# Patient Record
Sex: Female | Born: 1955 | Race: Black or African American | Hispanic: No | Marital: Single | Smoking: Light tobacco smoker
Health system: Southern US, Community
[De-identification: ages and names within clinical notes are randomized; demographics above are authoritative.]

## PROBLEM LIST (undated history)

## (undated) DIAGNOSIS — E119 Type 2 diabetes mellitus without complications: Secondary | ICD-10-CM

## (undated) DIAGNOSIS — Z923 Personal history of irradiation: Secondary | ICD-10-CM

## (undated) DIAGNOSIS — I10 Essential (primary) hypertension: Secondary | ICD-10-CM

## (undated) DIAGNOSIS — F32A Depression, unspecified: Secondary | ICD-10-CM

## (undated) DIAGNOSIS — H53149 Visual discomfort, unspecified: Secondary | ICD-10-CM

## (undated) DIAGNOSIS — E785 Hyperlipidemia, unspecified: Secondary | ICD-10-CM

## (undated) DIAGNOSIS — D332 Benign neoplasm of brain, unspecified: Secondary | ICD-10-CM

## (undated) DIAGNOSIS — I2510 Atherosclerotic heart disease of native coronary artery without angina pectoris: Secondary | ICD-10-CM

## (undated) DIAGNOSIS — M1990 Unspecified osteoarthritis, unspecified site: Secondary | ICD-10-CM

## (undated) DIAGNOSIS — I2119 ST elevation (STEMI) myocardial infarction involving other coronary artery of inferior wall: Secondary | ICD-10-CM

## (undated) DIAGNOSIS — R519 Headache, unspecified: Secondary | ICD-10-CM

## (undated) DIAGNOSIS — R51 Headache: Secondary | ICD-10-CM

## (undated) DIAGNOSIS — F329 Major depressive disorder, single episode, unspecified: Secondary | ICD-10-CM

## (undated) HISTORY — DX: Unspecified osteoarthritis, unspecified site: M19.90

## (undated) HISTORY — DX: Major depressive disorder, single episode, unspecified: F32.9

## (undated) HISTORY — DX: Atherosclerotic heart disease of native coronary artery without angina pectoris: I25.10

## (undated) HISTORY — DX: Type 2 diabetes mellitus without complications: E11.9

## (undated) HISTORY — DX: Headache: R51

## (undated) HISTORY — DX: Headache, unspecified: R51.9

## (undated) HISTORY — DX: Essential (primary) hypertension: I10

## (undated) HISTORY — PX: TONSILLECTOMY: SUR1361

## (undated) HISTORY — DX: Depression, unspecified: F32.A

## (undated) HISTORY — DX: Visual discomfort, unspecified: H53.149

## (undated) HISTORY — DX: ST elevation (STEMI) myocardial infarction involving other coronary artery of inferior wall: I21.19

---

## 2002-06-29 ENCOUNTER — Encounter: Payer: Self-pay | Admitting: Family Medicine

## 2002-06-29 ENCOUNTER — Ambulatory Visit (HOSPITAL_COMMUNITY): Admission: RE | Admit: 2002-06-29 | Discharge: 2002-06-29 | Payer: Self-pay | Admitting: Family Medicine

## 2002-07-20 ENCOUNTER — Ambulatory Visit (HOSPITAL_COMMUNITY): Admission: RE | Admit: 2002-07-20 | Discharge: 2002-07-20 | Payer: Self-pay | Admitting: Family Medicine

## 2002-07-20 ENCOUNTER — Encounter: Payer: Self-pay | Admitting: Family Medicine

## 2011-06-29 ENCOUNTER — Emergency Department (HOSPITAL_COMMUNITY)
Admission: EM | Admit: 2011-06-29 | Discharge: 2011-06-29 | Disposition: A | Discharge status: Other Acute Inpatient Hospital | Payer: Self-pay | Source: Home / Self Care | Attending: Emergency Medicine | Admitting: Emergency Medicine

## 2011-06-29 ENCOUNTER — Inpatient Hospital Stay (HOSPITAL_COMMUNITY)
Admission: AD | Admit: 2011-06-29 | Discharge: 2011-07-01 | DRG: 305 | Disposition: A | Discharge status: Home / Self Care | Payer: Self-pay | Source: Ambulatory Visit | Attending: Internal Medicine | Admitting: Internal Medicine

## 2011-06-29 DIAGNOSIS — M1990 Unspecified osteoarthritis, unspecified site: Secondary | ICD-10-CM | POA: Diagnosis present

## 2011-06-29 DIAGNOSIS — N390 Urinary tract infection, site not specified: Secondary | ICD-10-CM | POA: Diagnosis present

## 2011-06-29 DIAGNOSIS — R040 Epistaxis: Secondary | ICD-10-CM | POA: Diagnosis present

## 2011-06-29 DIAGNOSIS — IMO0002 Reserved for concepts with insufficient information to code with codable children: Secondary | ICD-10-CM | POA: Diagnosis present

## 2011-06-29 DIAGNOSIS — E1165 Type 2 diabetes mellitus with hyperglycemia: Secondary | ICD-10-CM | POA: Diagnosis present

## 2011-06-29 DIAGNOSIS — D332 Benign neoplasm of brain, unspecified: Secondary | ICD-10-CM | POA: Diagnosis present

## 2011-06-29 DIAGNOSIS — Z91199 Patient's noncompliance with other medical treatment and regimen due to unspecified reason: Secondary | ICD-10-CM

## 2011-06-29 DIAGNOSIS — Z9119 Patient's noncompliance with other medical treatment and regimen: Secondary | ICD-10-CM

## 2011-06-29 DIAGNOSIS — A498 Other bacterial infections of unspecified site: Secondary | ICD-10-CM | POA: Diagnosis present

## 2011-06-29 DIAGNOSIS — I10 Essential (primary) hypertension: Principal | ICD-10-CM | POA: Diagnosis present

## 2011-06-29 DIAGNOSIS — D62 Acute posthemorrhagic anemia: Secondary | ICD-10-CM | POA: Diagnosis present

## 2011-06-29 DIAGNOSIS — F17200 Nicotine dependence, unspecified, uncomplicated: Secondary | ICD-10-CM | POA: Diagnosis present

## 2011-06-29 LAB — DIFFERENTIAL
Basophils Absolute: 0 10*3/uL (ref 0.0–0.1)
Basophils Relative: 0 % (ref 0–1)
Eosinophils Absolute: 0.3 10*3/uL (ref 0.0–0.7)
Eosinophils Relative: 4 % (ref 0–5)
Lymphocytes Relative: 20 % (ref 12–46)
Lymphs Abs: 1.7 10*3/uL (ref 0.7–4.0)
Monocytes Absolute: 0.7 10*3/uL (ref 0.1–1.0)
Monocytes Relative: 8 % (ref 3–12)
Neutro Abs: 5.9 10*3/uL (ref 1.7–7.7)
Neutrophils Relative %: 69 % (ref 43–77)

## 2011-06-29 LAB — BASIC METABOLIC PANEL
BUN: 12 mg/dL (ref 6–23)
CO2: 27 mEq/L (ref 19–32)
Calcium: 9.3 mg/dL (ref 8.4–10.5)
Chloride: 102 mEq/L (ref 96–112)
Creatinine, Ser: 0.77 mg/dL (ref 0.50–1.10)
GFR calc Af Amer: >60 mL/min (ref >60)
GFR calc non Af Amer: >60 mL/min (ref >60)
Glucose, Bld: 190 mg/dL — ABNORMAL HIGH (ref 70–99)
Potassium: 3.7 mEq/L (ref 3.5–5.1)
Sodium: 136 mEq/L (ref 135–145)

## 2011-06-29 LAB — URINALYSIS, ROUTINE W REFLEX MICROSCOPIC
Bilirubin Urine: NEGATIVE
Glucose, UA: NEGATIVE mg/dL
Hgb urine dipstick: NEGATIVE
Ketones, ur: NEGATIVE mg/dL
Nitrite: NEGATIVE
Protein, ur: NEGATIVE mg/dL
Specific Gravity, Urine: 1.019 (ref 1.005–1.030)
Urobilinogen, UA: 1 mg/dL (ref 0.0–1.0)
pH: 6.5 (ref 5.0–8.0)

## 2011-06-29 LAB — CBC
HCT: 35 % — ABNORMAL LOW (ref 36.0–46.0)
Hemoglobin: 10.9 g/dL — ABNORMAL LOW (ref 12.0–15.0)
MCH: 26.8 pg (ref 26.0–34.0)
MCHC: 31.1 g/dL (ref 30.0–36.0)
MCV: 86 fL (ref 78.0–100.0)
Platelets: 364 10*3/uL (ref 150–400)
RBC: 4.07 MIL/uL (ref 3.87–5.11)
RDW: 13.9 % (ref 11.5–15.5)
WBC: 8.6 10*3/uL (ref 4.0–10.5)

## 2011-06-29 LAB — GLUCOSE, CAPILLARY
Glucose-Capillary: 124 mg/dL — ABNORMAL HIGH (ref 70–99)
Glucose-Capillary: 183 mg/dL — ABNORMAL HIGH (ref 70–99)

## 2011-06-29 LAB — POCT I-STAT, CHEM 8
BUN: 12 mg/dL (ref 6–23)
Calcium, Ion: 1.23 mmol/L (ref 1.12–1.32)
Chloride: 103 mEq/L (ref 96–112)
Creatinine, Ser: 0.9 mg/dL (ref 0.50–1.10)
Glucose, Bld: 179 mg/dL — ABNORMAL HIGH (ref 70–99)
HCT: 36 % (ref 36.0–46.0)
Hemoglobin: 12.2 g/dL (ref 12.0–15.0)
Potassium: 3.2 mEq/L — ABNORMAL LOW (ref 3.5–5.1)
Sodium: 141 mEq/L (ref 135–145)
TCO2: 27 mmol/L (ref 0–100)

## 2011-06-29 LAB — PROTIME-INR
INR: 1.01 (ref 0.00–1.49)
Prothrombin Time: 13.5 seconds (ref 11.6–15.2)

## 2011-06-29 LAB — URINE MICROSCOPIC-ADD ON

## 2011-06-30 LAB — CBC
HCT: 31.9 % — ABNORMAL LOW (ref 36.0–46.0)
Hemoglobin: 10.5 g/dL — ABNORMAL LOW (ref 12.0–15.0)
MCH: 27.8 pg (ref 26.0–34.0)
MCHC: 32.9 g/dL (ref 30.0–36.0)
MCV: 84.4 fL (ref 78.0–100.0)
Platelets: 319 10*3/uL (ref 150–400)
RBC: 3.78 MIL/uL — ABNORMAL LOW (ref 3.87–5.11)
RDW: 14 % (ref 11.5–15.5)
WBC: 9 10*3/uL (ref 4.0–10.5)

## 2011-06-30 LAB — BASIC METABOLIC PANEL
BUN: 12 mg/dL (ref 6–23)
CO2: 25 mEq/L (ref 19–32)
Calcium: 9.1 mg/dL (ref 8.4–10.5)
Chloride: 102 mEq/L (ref 96–112)
Creatinine, Ser: 0.72 mg/dL (ref 0.50–1.10)
GFR calc Af Amer: >60 mL/min (ref >60)
GFR calc non Af Amer: >60 mL/min (ref >60)
Glucose, Bld: 143 mg/dL — ABNORMAL HIGH (ref 70–99)
Potassium: 3.6 mEq/L (ref 3.5–5.1)
Sodium: 136 mEq/L (ref 135–145)

## 2011-06-30 LAB — GLUCOSE, CAPILLARY
Glucose-Capillary: 145 mg/dL — ABNORMAL HIGH (ref 70–99)
Glucose-Capillary: 116 mg/dL — ABNORMAL HIGH (ref 70–99)
Glucose-Capillary: 136 mg/dL — ABNORMAL HIGH (ref 70–99)
Glucose-Capillary: 162 mg/dL — ABNORMAL HIGH (ref 70–99)

## 2011-06-30 LAB — HEMOGLOBIN A1C
Hgb A1c MFr Bld: 7.6 % — ABNORMAL HIGH (ref <5.7)
Mean Plasma Glucose: 171 mg/dL — ABNORMAL HIGH (ref <117)

## 2011-06-30 LAB — MRSA PCR SCREENING: MRSA by PCR: NEGATIVE

## 2011-06-30 LAB — LIPID PANEL
Cholesterol: 102 mg/dL (ref 0–200)
HDL: 31 mg/dL — ABNORMAL LOW (ref >39)
LDL Cholesterol: 52 mg/dL (ref 0–99)
Total CHOL/HDL Ratio: 3.3 RATIO
Triglycerides: 96 mg/dL (ref <150)
VLDL: 19 mg/dL (ref 0–40)

## 2011-07-01 LAB — GLUCOSE, CAPILLARY
Glucose-Capillary: 158 mg/dL — ABNORMAL HIGH (ref 70–99)
Glucose-Capillary: 164 mg/dL — ABNORMAL HIGH (ref 70–99)

## 2011-07-01 LAB — URINE CULTURE
Colony Count: >=100000
Culture  Setup Time: 201207101146

## 2011-07-04 NOTE — Discharge Summary (Signed)
  NAMECAROLEENA, PAOLINI NO.:  [id number]  MEDICAL RECORD NO.:  [id number]  LOCATION:  5009                         FACILITY:  [facility]  PHYSICIAN:  Lonia Blood, M.D.       DATE OF BIRTH:  1956-11-11  DATE OF ADMISSION:  06/29/2011 DATE OF DISCHARGE:  07/01/2011                              DISCHARGE SUMMARY   PRIMARY CARE PHYSICIAN:  This patient has been set up with [facility].  DISCHARGE DIAGNOSES: 1. Uncontrolled hypertension. 2. Diabetes mellitus type 2. 3. Uncontrolled epistaxis requiring right-sided nasal endoscopy and     cautery of the inferior meatus with packing. 4. 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 5. Osteoarthritis. 6. Escherichia coli urinary tract infection.  DISCHARGE MEDICATIONS: 1. Lisinopril/hydrochlorothiazide 20/12.5 daily. 2. Metformin 1000 mg twice a day. 3. Nifedipine extended release 30 mg daily. 4. Keflex 500 mg by mouth three times a day.  CONDITION ON DISCHARGE:  Ms. Betke discharged in good condition, alert, oriented, no acute distress.  She will follow up with [facility] for hypertension control.  She will follow up with Dr. Jearld Fenton at [facility] for removal of the packing from the right nostril.  At the time of discharge, the patient was provided with 1 month worth of medications free.  HISTORY AND PHYSICAL:  Refer dictated H and P done by Dr. Isidor Holts.  PROCEDURES:  Ms. Lapre underwent right-sided nasal endoscopy cautery and packing to relieve her epistaxis by Dr. Suzanna Obey.  HOSPITAL COURSE:  Ms. Hecht is a 55 year old woman with uncontrolled hypertension presented to the [facility] Emergency Room with profound epistaxis.  It was felt that the reason for epistaxis was uncontrolled hypertension.  The patient did not have any proven blood dyscrasia and she had a normal platelet count.  Ms. Ojo was observed in the step- down unit at  [facility] where she was deemed that she requires endoscopy and nasal packing.  She was taken to the operating room on June 30, 2011, where nasal packing was applied. Postoperatively, she did not have any more epistaxis and hemoglobin stabilized at the level of 10.5 by the time of discharge.  She was started back on her antihypertensives and they can be further titrated upwards in the outpatient setting.     Lonia Blood, M.D.     SL/MEDQ  D:  07/03/2011  T:  07/03/2011  Job:  [id number]  cc:   Suzanna Obey, M.D. [facility]  Electronically Signed by Lonia Blood M.D. on 07/04/2011 07:58:19 AM

## 2011-07-07 NOTE — H&P (Signed)
NAMEALEXANDERIA, Harvey NO.:  [id number]  MEDICAL RECORD NO.:  [id number]  LOCATION:  3301                         FACILITY:  [facility]  PHYSICIAN:  Isidor Holts, M.D.  DATE OF BIRTH:  August 18, 1956  DATE OF ADMISSION:  06/29/2011 DATE OF DISCHARGE:                             HISTORY & PHYSICAL   PRIMARY MD:  Gentry Fitz.  CHIEF COMPLAINT:  Bleeding from right nostril since midnight on June 30, 2011.  HISTORY OF PRESENT ILLNESS:  This is a 55 year old female. For past medical history, see below.  The patient is a very good historian and supplied the history.  According to her, she was in her usual state of health until she went to bed on June 29, 2011.  She woke up about 12 midnight to 12:30 a.m. on June 30, 2011, to go to the bathroom and started bleeding profusely from her right nostril.  No history of antecedent trauma or blowing her nose.  She tried to control this with pressure, without much success.  Denies any dizziness, shortness of breath, or chest pain.  She called the emergency medical services.  They arrived, but by the time they got to the home, the bleeding had stopped spontaneously, so they went away.  Awhile later, the bleeding recurred and remained persistent.  She called the EMS again and this time, was taken to [facility] Emergency Department where she was managed with ice pack and Afrin nasal spray and Dr. Jearld Fenton, ENT surgeon evaluated her and placed a nasal pack.  The patient was noted at that time to have significantly high blood pressure.  She was therefore referred to the medical service for admission for blood pressure control and management of other medical problems.  The patient was then transferred from the [facility] ED to [facility].  PAST MEDICAL HISTORY: 1. Hypertension. 2. Type 2 diabetes mellitus, diagnosed approximately 3-4 months ago. 3. Benign brain tumor, diagnosed approximately 7 years ago at [facility].  According to the patient, this was inoperable secondary     to its location.  Her last head CT scan was about 1 year ago. 4. Status post tonsillectomy in childhood. 5. Osteoarthritis. 6. History of polysubstance abuse, i.e. marijuana, crack cocaine,     alcohol.  As a matter of fact, the patient underwent inpatient     rehabilitation approximately 15 years ago.  She states that she     fell over the wagon, but has been "clean" for the past 14 months.  ALLERGIES:  SULFA.  MEDICATION HISTORY: 1. Metformin 1000 mg p.o. b.i.d. 2. Lisinopril 1 pill p.o. daily (dose unknown). 3. Norvasc one p.o. daily (dose unknown). 4. Meloxicam one p.o. daily (dose unknown). 5. Hydrochlorothiazide one p.o. daily (dose unknown).  The patient ran out of her medications approximately 1 month ago.  Has been unable to obtain refills.  She has no primary MD and therefore has been noncompliant.  SOCIAL HISTORY:  The patient is unemployed.  Her disability is pending at this time.  She has no offsprings, used to be a rather heavy smoker,smoked about a packet of cigarettes per day for the past 30 years but has been cutting down  drastically and is now down to 3-4 cigarettes per day.  The patient also used to drink alcohol about 1 liter of liquor per week.  According to her, she has been abstinent for past 14 months.  She has also utilized crack cocaine and marijuana in the past.  FAMILY HISTORY:  The patient's mother is deceased at age 58 years status post massive MI.  She was hypertensive.  The patient's father deceased at age 51 years status post CVA.  He was also hypertensive.  REVIEW OF SYSTEMS:  As per HPI and chief complaint.  The patient denies abdominal pain, vomiting, or diarrhea.  Denies chest pain or shortness of breath.  Denies headache.  Denies fever or chills.  Denies dysuria or urinary frequency.  Rest of systems review is negative.  PHYSICAL EXAMINATION:  VITAL SIGNS:  Temperature  97.6, pulse 75 per minute and regular, respiratory rate 16, BP initially 171/102 mmHg and rechecked 167/92 mmHg, pulse oximetry 98% on room air. GENERAL:  The patient appeared to be in no obvious acute distress at the time of this evaluation.  Alert, communicative, not short of breath at rest. HEENT:  No clinical pallor.  No jaundice.  No conjunctival injection. The patient has nasal pack in right nostril, has intermittent small bleed from this area.  Left nostril is unremarkable.  Throat is clear. NECK:  Supple.  JVP not seen.  No palpable lymphadenopathy.  No palpable goiter. CHEST:  Clinically clear to auscultation.  No wheezes.  No crackles. HEART:  Sounds are heard, normal, regular.  No murmurs. ABDOMEN:  Moderately obese, soft, nontender.  No palpable organomegaly. No palpable masses.  Normal bowel sounds. LOWER EXTREMITIES:  No pitting edema.  Palpable peripheral pulses. MUSCULOSKELETAL SYSTEM:  Unremarkable. CENTRAL NERVOUS SYSTEM:  No focal neurologic deficits on gross examination.  INVESTIGATIONS:  CBC; WBC 8.6, hemoglobin 12.2, hematocrit 36.0, platelets 364.  INR 1.01.  Electrolytes; sodium 141, potassium 3.2, chloride 103, CO2 of 27, BUN 12, creatinine 0.90, glucose 179. Urinalysis shows wbc's 11-20, rbc's 0-2, bacteria many.  ASSESSMENT AND PLAN: 1. Epistaxis.  This was spontaneous, without antecedent trauma or     blowing of nostrils.  The patient has no history of     thrombocytopenia.  No history of coagulopathy or bleeding     diathesis.  No history of previous similar episodes.  Hemoglobin is     normal at the present time.  We shall manage per ENT.  The patient     already has a nasal pack in place and Dr. Jearld Fenton, ENT surgeon will     provide consultation.  We shall of course avoid anticoagulation and     antiplatelet medications at this time.  2. Hypertension.  This is uncontrolled and is likely contributory to     epistaxis.  The patient has been noncompliant  with medications for     approximately 1 month.  We shall recommence antihypertensive     medications and monitor.  3. Type 2 diabetes mellitus.  This is uncontrolled, also secondary to     noncompliance.  We shall restart oral hypoglycemics, place the     patient on appropriate diet, utilize sliding scale insulin coverage     for now and do HbA1c for completeness.  4. History of brain tumor.  This is benign/asymptomatic.  There is no     indication for specific management at this time, or indeed imaging     studies.  5. Smoking history.  The patient  has been counseled appropriately.  6. Previous history of alcohol abuse.  The patient states that she has     been abstinent for the past 14 months.  We shall, however, maintain     vigilance of possible alcohol withdrawal phenomena.  7. Urinary tract infection as evidenced by positive urinary sediment.     We shall place the patient on a 7-day course of oral Levaquin.  Further management will depend on clinical course.  For completeness, we shall do urine drug screen.  In addition, the patient will need a primary MD for followup of diabetes and hypertension and appropriate management.  This will be arranged via care manager/clinical social worker.     Isidor Holts, M.D.     CO/MEDQ  D:  06/29/2011  T:  06/29/2011  Job:  [id number]  cc:   Suzanna Obey, M.D.  Electronically Signed by Isidor Holts M.D. on 07/07/2011 05:44:47 PM

## 2011-07-15 NOTE — Op Note (Signed)
  NAMEALZADA, Christine Harvey NO.:  [id number]  MEDICAL RECORD NO.:  [id number]  LOCATION:  5009                         FACILITY:  [facility]  PHYSICIAN:  Suzanna Obey, M.D.       DATE OF BIRTH:  01/14/1956  DATE OF PROCEDURE:  06/30/2011 DATE OF DISCHARGE:                              OPERATIVE REPORT   PREOPERATIVE DIAGNOSIS:  Epistaxis.  POSTOPERATIVE DIAGNOSIS:  Epistaxis.  SURGICAL PROCEDURE:  Right-sided nasal endoscopy with cautery of inferior meatus and septum with a packing and placement.  ANESTHESIA:  General.  ESTIMATED BLOOD LOSS:  Approximately 10 mL.  INDICATIONS:  This is a 55 year old with a persistent bleeding despite two packing episodes with evaluation and no source identified.  She has uncontrolled hypertension and is continuing to bleed and elected to proceed for control by examination with the endoscope.  She was informed of the risks and benefits of the procedure and options were discussed. All questions were answered and consent was obtained.  OPERATION IN DETAIL:  The patient was taken to the operating room and placed in supine position.  After general endotracheal tube anesthesia, the pack of Merocel on the right side was removed.  There were clots that were removed from the nose.  Nasal endoscopy was performed and Afrin pledget was placed to decongest the inferior turbinate as no obvious sources were identified prior to placing it.  Once decongested, better visualization could be made and there seemed to be some bleeding coming from underneath the inferior turbinate.  The turbinate was infractured and the examination in the inferior meatus revealed a bleeding site that was cauterized.  This was about midway on the undersurface of the inferior turbinate.  Surgicel was then packed into this meatus and then the turbinate was outfractured.  There was still some additional bleeding that was occurring along the septum posterior to a septal spur.   The spur was elevated with the [facility] elevator to expose the bleeding site posterior to the spur.  Once this was performed, cautery was performed of this area which controlled the area of bleeding.  There was no other sites that could be identified in the posterior aspect and middle meatus superior aspect of the nose.  No further blood seem to be running down in any location.  The Merocel soaked in triple antibiotic cream was placed into the nose and secured with a tape and the patient was awakened and brought to recovery in stable condition.  Counts correct.          ______________________________ Suzanna Obey, M.D.     JB/MEDQ  D:  06/30/2011  T:  06/30/2011  Job:  [id number]  Electronically Signed by Suzanna Obey M.D. on 07/15/2011 09:18:23 AM

## 2011-10-15 ENCOUNTER — Emergency Department (HOSPITAL_COMMUNITY)
Admission: EM | Admit: 2011-10-15 | Discharge: 2011-10-15 | Disposition: A | Discharge status: Home / Self Care | Payer: Self-pay | Attending: Emergency Medicine | Admitting: Emergency Medicine

## 2011-10-15 ENCOUNTER — Emergency Department (HOSPITAL_COMMUNITY): Payer: Self-pay

## 2011-10-15 DIAGNOSIS — D320 Benign neoplasm of cerebral meninges: Secondary | ICD-10-CM | POA: Insufficient documentation

## 2011-10-15 DIAGNOSIS — E119 Type 2 diabetes mellitus without complications: Secondary | ICD-10-CM | POA: Insufficient documentation

## 2011-10-15 DIAGNOSIS — Z79899 Other long term (current) drug therapy: Secondary | ICD-10-CM | POA: Insufficient documentation

## 2011-10-15 DIAGNOSIS — I10 Essential (primary) hypertension: Secondary | ICD-10-CM | POA: Insufficient documentation

## 2011-10-15 DIAGNOSIS — R110 Nausea: Secondary | ICD-10-CM | POA: Insufficient documentation

## 2011-10-15 DIAGNOSIS — R51 Headache: Secondary | ICD-10-CM | POA: Insufficient documentation

## 2011-10-15 DIAGNOSIS — H53149 Visual discomfort, unspecified: Secondary | ICD-10-CM | POA: Insufficient documentation

## 2011-10-15 MED ORDER — GADOBENATE DIMEGLUMINE 529 MG/ML IV SOLN
20.0000 mL | Freq: Once | INTRAVENOUS | Status: AC
  Administered 2011-10-15: 20 mL via INTRAVENOUS

## 2012-04-17 ENCOUNTER — Emergency Department (HOSPITAL_COMMUNITY): Payer: Medicaid Other

## 2012-04-17 ENCOUNTER — Encounter (HOSPITAL_COMMUNITY): Payer: Self-pay

## 2012-04-17 ENCOUNTER — Emergency Department (HOSPITAL_COMMUNITY)
Admission: EM | Admit: 2012-04-17 | Discharge: 2012-04-17 | Disposition: A | Discharge status: Home / Self Care | Payer: Medicaid Other | Attending: Emergency Medicine | Admitting: Emergency Medicine

## 2012-04-17 DIAGNOSIS — I10 Essential (primary) hypertension: Secondary | ICD-10-CM | POA: Insufficient documentation

## 2012-04-17 DIAGNOSIS — G939 Disorder of brain, unspecified: Secondary | ICD-10-CM | POA: Insufficient documentation

## 2012-04-17 DIAGNOSIS — R51 Headache: Secondary | ICD-10-CM | POA: Insufficient documentation

## 2012-04-17 DIAGNOSIS — Z79899 Other long term (current) drug therapy: Secondary | ICD-10-CM | POA: Insufficient documentation

## 2012-04-17 DIAGNOSIS — G9389 Other specified disorders of brain: Secondary | ICD-10-CM

## 2012-04-17 DIAGNOSIS — E119 Type 2 diabetes mellitus without complications: Secondary | ICD-10-CM | POA: Insufficient documentation

## 2012-04-17 HISTORY — DX: Benign neoplasm of brain, unspecified: D33.2

## 2012-04-17 MED ORDER — MORPHINE SULFATE 4 MG/ML IJ SOLN
4.0000 mg | Freq: Once | INTRAMUSCULAR | Status: AC
  Administered 2012-04-17: 4 mg via INTRAMUSCULAR
  Filled 2012-04-17: qty 1

## 2012-04-17 MED ORDER — HYDROCODONE-ACETAMINOPHEN 5-325 MG PO TABS: 1.0000 | ORAL_TABLET | Freq: Four times a day (QID) | ORAL | Status: AC | PRN

## 2012-04-17 NOTE — ED Notes (Addendum)
Patient presents with constant temporal and occipital headache x 2 days with mild dizziness. Patient denies n/v, blurred vision.  Patient has hx of headaches due to benign tumor that has been managed for past 7 years.

## 2012-04-17 NOTE — Discharge Instructions (Signed)
Headache Headaches are caused by many different problems. Most commonly, headache is caused by muscle tension from an injury, fatigue, or emotional upset. Excessive muscle contractions in the scalp and neck result in a headache that often feels like a tight band around the head. Tension headaches often have areas of tenderness over the scalp and the back of the neck. These headaches may last for hours, days, or longer, and some may contribute to migraines in those who have migraine problems. Migraines usually cause a throbbing headache, which is made worse by activity. Sometimes only one side of the head hurts. Nausea, vomiting, eye pain, and avoidance of food are common with migraines. Visual symptoms such as light sensitivity, blind spots, or flashing lights may also occur. Loud noises may worsen migraine headaches. Many factors may cause migraine headaches:  Emotional stress, lack of sleep, and menstrual periods.   Alcohol and some drugs (such as birth control pills).   Diet factors (fasting, caffeine, food preservatives, chocolate).   Environmental factors (weather changes, bright lights, odors, smoke).  Other causes of headaches include minor injuries to the head. Arthritis in the neck; problems with the jaw, eyes, ears, or nose are also causes of headaches. Allergies, drugs, alcohol, and exposure to smoke can also cause moderate headaches. Rebound headaches can occur if someone uses pain medications for a long period of time and then stops. Less commonly, blood vessel problems in the neck and brain (including stroke) can cause various types of headache. Treatment of headaches includes medicines for pain and relaxation. Ice packs or heat applied to the back of the head and neck help some people. Massaging the shoulders, neck and scalp are often very useful. Relaxation techniques and stretching can help prevent these headaches. Avoid alcohol and cigarette smoking as these tend to make headaches  worse. Please see your caregiver if your headache is not better in 2 days.  SEEK IMMEDIATE MEDICAL CARE IF:   You develop a high fever, chills, or repeated vomiting.   You faint or have difficulty with vision.   You develop unusual numbness or weakness of your arms or legs.   Relief of pain is inadequate with medication, or you develop severe pain.   You develop confusion, or neck stiffness.   You have a worsening of a headache or do not obtain relief.  Document Released: 12/06/2005 Document Revised: 11/25/2011 Document Reviewed: 06/01/2007 [employer] Patient Information 2012 [employer]. 

## 2012-04-17 NOTE — ED Provider Notes (Signed)
History     CSN: [id number]  Arrival date & time 04/17/12  1343   First MD Initiated Contact with Patient 04/17/12 1555      Chief Complaint  Patient presents with  . Headache    (Consider location/radiation/quality/duration/timing/severity/associated sxs/prior treatment) HPI Pt with known benign brain mass with daily HA states she has had a worse HA for the last 2 days. Pain is constant and mostly located in R occiput region. No nausea vomiting, photophobia, focal weakness or sensory changes. No fever chills.  Past Medical History  Diagnosis Date  . Hypertension   . Diabetes mellitus   . Brain tumor (benign) 7 years    known tumor    History reviewed. No pertinent past surgical history.  No family history on file.  History  Substance Use Topics  . Smoking status: Current Some Day Smoker  . Smokeless tobacco: Not on file  . Alcohol Use: Yes    OB History    Grav Para Term Preterm Abortions TAB SAB Ect Mult Living                  Review of Systems  Constitutional: Negative for fever and chills.  HENT: Negative for sore throat and sinus pressure.   Eyes: Negative for photophobia and visual disturbance.  Gastrointestinal: Negative for nausea and vomiting.  Neurological: Positive for headaches. Negative for dizziness, seizures, syncope, weakness and numbness.    Allergies  Sulfa antibiotics  Home Medications   Current Outpatient Rx  Name Route Sig Dispense Refill  . DILTIAZEM HCL ER 180 MG PO CP24 Oral Take 180 mg by mouth daily.    . IBUPROFEN 200 MG PO TABS Oral Take 200 mg by mouth every 6 (six) hours as needed. For headache    . LOSARTAN POTASSIUM-HCTZ 100-25 MG PO TABS Oral Take 1 tablet by mouth daily.    . MELOXICAM 15 MG PO TABS Oral Take 15 mg by mouth daily.    Marland Kitchen METFORMIN HCL 1000 MG PO TABS Oral Take 1,000 mg by mouth 2 (two) times daily with a meal.    . HYDROCODONE-ACETAMINOPHEN 5-325 MG PO TABS Oral Take 1 tablet by mouth every 6 (six) hours  as needed for pain. 30 tablet 0    BP 158/89  Pulse 78  Temp(Src) 98.1 F (36.7 C) (Oral)  Resp 16  SpO2 98%  Physical Exam  Nursing note and vitals reviewed. Constitutional: She is oriented to person, place, and time. She appears well-developed and well-nourished. No distress.  HENT:  Head: Normocephalic and atraumatic.  Mouth/Throat: Oropharynx is clear and moist.  Eyes: EOM are normal. Pupils are equal, round, and reactive to light.  Neck: Normal range of motion. Neck supple.  Cardiovascular: Normal rate and regular rhythm.   Pulmonary/Chest: Effort normal and breath sounds normal. No respiratory distress. She has no wheezes. She has no rales.  Abdominal: Soft. Bowel sounds are normal. There is no tenderness. There is no rebound and no guarding.  Musculoskeletal: Normal range of motion. She exhibits no edema and no tenderness.  Neurological: She is alert and oriented to person, place, and time.       5/5 motor in all ext, sensation intact, CN II-XII intact. Finger to nose intact  Skin: Skin is warm and dry. No rash noted. No erythema.  Psychiatric: She has a normal mood and affect. Her behavior is normal.    ED Course  Procedures (including critical care time)  Labs Reviewed - No data to  display Ct Head Wo Contrast  04/17/2012  *RADIOLOGY REPORT*  Clinical Data: Headache, known brain mass  CT HEAD WITHOUT CONTRAST  Technique:  Contiguous axial images were obtained from the base of the skull through the vertex without contrast.  Comparison: 10/15/2011  Findings: Normal ventricular morphology. No midline shift or hydrocephalus. Again identified calcified mass adjacent to the clivus, measuring approximately 2.3 x 1.4 cm image 9 and extending nearly 2 cm in craniocaudal length. Lesion is most consistent with a calcified meningioma. Lesion exerts mass effect upon the right ventral aspect of the pons. No intracranial hemorrhage, additional mass lesions or evidence of acute infarction.  No extra-axial fluid collections definitely visualized. Visualized paranasal sinuses and mastoid air cells clear. No acute osseous findings.  IMPRESSION: Again identified calcified mass adjacent to the clivus most consistent with a calcified meningioma, exerting mass effect upon the right ventral aspect of the pons. Appearance is similar to that seen on the previous study of 10/15/2011. No new intracranial abnormalities.  Original Report Authenticated By: Lollie Marrow, M.D.     1. Headache   2. Brain mass       MDM   Pt states she is feeling much better. Asking to be D/C'd home. F/u with PMD       Loren Racer, MD 04/17/12 1746

## 2012-06-12 ENCOUNTER — Emergency Department (HOSPITAL_COMMUNITY): Payer: Medicaid Other

## 2012-06-12 ENCOUNTER — Encounter (HOSPITAL_COMMUNITY): Payer: Self-pay

## 2012-06-12 ENCOUNTER — Emergency Department (HOSPITAL_COMMUNITY)
Admission: EM | Admit: 2012-06-12 | Discharge: 2012-06-12 | Disposition: A | Discharge status: Home / Self Care | Payer: Medicaid Other | Attending: Emergency Medicine | Admitting: Emergency Medicine

## 2012-06-12 DIAGNOSIS — D329 Benign neoplasm of meninges, unspecified: Secondary | ICD-10-CM

## 2012-06-12 DIAGNOSIS — D320 Benign neoplasm of cerebral meninges: Secondary | ICD-10-CM | POA: Insufficient documentation

## 2012-06-12 DIAGNOSIS — R51 Headache: Secondary | ICD-10-CM | POA: Insufficient documentation

## 2012-06-12 DIAGNOSIS — I10 Essential (primary) hypertension: Secondary | ICD-10-CM | POA: Insufficient documentation

## 2012-06-12 DIAGNOSIS — E119 Type 2 diabetes mellitus without complications: Secondary | ICD-10-CM | POA: Insufficient documentation

## 2012-06-12 MED ORDER — HYDROMORPHONE HCL PF 1 MG/ML IJ SOLN
1.0000 mg | Freq: Once | INTRAMUSCULAR | Status: AC
  Administered 2012-06-12: 1 mg via INTRAMUSCULAR
  Filled 2012-06-12: qty 1

## 2012-06-12 MED ORDER — OXYCODONE-ACETAMINOPHEN 5-325 MG PO TABS
1.0000 | ORAL_TABLET | Freq: Once | ORAL | Status: AC
  Administered 2012-06-12: 1 via ORAL
  Filled 2012-06-12: qty 1

## 2012-06-12 MED ORDER — HYDROMORPHONE HCL PF 1 MG/ML IJ SOLN: 1.0000 mg | Freq: Once | INTRAMUSCULAR | Status: DC

## 2012-06-12 MED ORDER — HYDROCODONE-ACETAMINOPHEN 5-325 MG PO TABS: 1.0000 | ORAL_TABLET | ORAL | Status: AC | PRN

## 2012-06-12 NOTE — ED Provider Notes (Signed)
History     CSN: [id number]  Arrival date & time 06/12/12  4098   First MD Initiated Contact with Patient 06/12/12 1139      Chief Complaint  Patient presents with  . Headache    (Consider location/radiation/quality/duration/timing/severity/associated sxs/prior treatment) Patient is a 56 y.o. female presenting with headaches. The history is provided by the patient.  Headache  This is a chronic problem. Episode onset: several years. The problem occurs constantly. The problem has been gradually worsening. The headache is associated with bright light and loud noise. Pain location: global. The quality of the pain is described as throbbing. The pain is severe. The pain does not radiate. Pertinent negatives include no fever, no chest pressure, no near-syncope, no shortness of breath, no nausea and no vomiting. Associated symptoms comments: Pos dizziness, cloudy vision. She has tried oral narcotic analgesics for the symptoms. The treatment provided significant (has run out of narcotics) relief.  Moved back to [place] approx 1 year ago, unable to follow-up with [facility] due to distance/finances. Has been using leftover medication since that time, with 2 ED visits and a single narcotic rx from ED provider 2 months ago.  Past Medical History  Diagnosis Date  . Hypertension   . Diabetes mellitus   . Brain tumor (benign) 7 years    known tumor    History reviewed. No pertinent past surgical history.  No family history on file.  History  Substance Use Topics  . Smoking status: Current Some Day Smoker  . Smokeless tobacco: Not on file  . Alcohol Use: Yes     Review of Systems  Constitutional: Negative for fever.  Respiratory: Negative for shortness of breath.   Cardiovascular: Negative for near-syncope.  Gastrointestinal: Negative for nausea and vomiting.  Neurological: Positive for headaches.  10 systems reviewed and are otherwise negative for acute change except as noted in the  HPI.   Allergies  Sulfa antibiotics  Home Medications   Current Outpatient Rx  Name Route Sig Dispense Refill  . DILTIAZEM HCL ER 180 MG PO CP24 Oral Take 180 mg by mouth daily.    Marland Kitchen LOSARTAN POTASSIUM-HCTZ 100-25 MG PO TABS Oral Take 1 tablet by mouth daily.    . MELOXICAM 15 MG PO TABS Oral Take 15 mg by mouth daily.    Marland Kitchen METFORMIN HCL 1000 MG PO TABS Oral Take 1,000 mg by mouth 2 (two) times daily with a meal.      BP 154/87  Pulse 84  Temp 97.3 F (36.3 C) (Oral)  Resp 28  SpO2 95%  Physical Exam  Nursing note reviewed. Constitutional: She is oriented to person, place, and time.       Vital signs are reviewed and are normal.   HENT:  Head: Normocephalic and atraumatic.  Right Ear: External ear normal.  Left Ear: External ear normal.       MMM  Eyes: Conjunctivae and EOM are normal. Pupils are equal, round, and reactive to light.       No nystagmus.   Neck: Neck supple.  Cardiovascular: Normal rate, regular rhythm and normal heart sounds.        Bilateral radial and DP pulses are 2+   Pulmonary/Chest: Effort normal and breath sounds normal. No respiratory distress. She has no wheezes.  Abdominal: Soft. Bowel sounds are normal. She exhibits no distension. There is no tenderness.  Musculoskeletal: She exhibits no edema.       Calves supple and non-tender  Neurological: She is alert  and oriented to person, place, and time. She has normal strength. No cranial nerve deficit (3-12 intact) or sensory deficit (intact to light touch in all extremities distally). She displays a negative Romberg sign. Coordination (F-N intact bilaterally. RAM intact.) and gait normal. GCS eye subscore is 4. GCS verbal subscore is 5. GCS motor subscore is 6.  Skin: Skin is warm and dry. No rash noted.  Psychiatric: She has a normal mood and affect.    ED Course  Procedures (including critical care time)  Labs Reviewed - No data to display Ct Head Wo Contrast  06/12/2012  *RADIOLOGY REPORT*   Clinical Data: Worsening headache, history of meningioma  CT HEAD WITHOUT CONTRAST  Technique:  Contiguous axial images were obtained from the base of the skull through the vertex without contrast.  Comparison: 04/17/2012  Findings: No skull fracture is noted.  Paranasal sinuses and mastoid air cells are unremarkable.  Again noted calcified mass adjacent to the clivus measures 2.3 by 1.3 cm.    Again noted mass effect on the ventral aspect of the pons. This lesion is stable in size and appearance from prior exam consistent with calcified meningioma.  No intracranial hemorrhage or midline shift.  No cerebral mass effect.  No acute cortical infarction.  IMPRESSION: Stable in size and appearance calcified meningioma adjacent to the clivus measures 2.3 x 1.3 cm.  No intracranial hemorrhage or midline shift.  No acute infarction.  Original Report Authenticated By: Natasha Mead, M.D.     Dx 1: HA Dx 2: Meningioma   MDM  Chronic HA, subjective visual disturbance. CT head with stable meningioma size compare to prior evals. Neuro and motor exam without gross deficit. HINTS exam benign, steady gait. Visual acuity tested after pain medication administration by myself with bedside card, 20/70 in each eye tested individually, with correction. Pt will be d/c home, reports she will  Be able to obtain PCP very soon as Medicaid is almost approved. Will give small rx of pain medication for use in interim, provide resource guide.        Shaaron Adler, [place] 06/12/12 1615

## 2012-06-12 NOTE — Discharge Instructions (Signed)
If you have no primary doctor, here are some resources that may be helpful:  Medicaid-accepting [facility] Providers:   - [facility]- [address]      [phone]      Mon-Fri 9am-7pm, Sat 9am-1pm   - [facility]- [address] [place], [place]      [phone]   - [facility]- [address] [place]      [phone]   [facility]- [address]      [phone]   - Renaye Rakers- [address] [place], [address]      [phone]      Only accepts [place] patients       after they have her name applied to their card   Self Pay (no insurance) in [place]:   - Sickle Cell Patients: Dr Willey Blade, [facility]      [address] [place]      [phone]   - [phone]   - Physician Referral Service- [phone]   - [facility]- [address] [place]      [phone]   [facility] [place]- [address]   - [facility]- see information above      (Speak to [employer] if you do not have insurance)   - [facility]- [address] [place]      [phone]   - [facility] [place]- [address]      [phone]   - [facility]- [address]      [phone]   - Dr Julio Sicks-  [address], [place]      [phone]   - [facility]- [address]      [phone]   - [facility]- [address]      [phone]      Also [address]      [phone]   - [facility]- [address]      [phone]      1st and 3rd Saturday every month, 10am-1pm Other agencies that provide inexpensive medical care:    [facility]  [phone]    [facility]  [phone]    [facility]  [phone]    [facility]  [phone]    [facility]  [phone]  General Information: Finding a doctor when you do  not have health insurance can be tricky. Although you are not limited by an insurance plan, you are of course limited by her finances and how much but he can pay out of pocket.  What are your options if you don't have health insurance?   1) Find a Librarian, academic and Pay Out of Pocket Although you won't have to find out who is covered by your insurance plan, it is a good idea to ask around and get recommendations. You will then need to call the office and see if the doctor you have chosen will accept you as a new patient and what types of options they offer for patients who are self-pay. Some doctors offer discounts or will set up payment plans for their patients who do not have insurance, but you will need to ask so you aren't surprised when you get to your appointment.  2) Contact Your Local Health Department Not all health departments have doctors that can see patients for sick visits, but many do, so it is worth a call to see if yours does. If you don't know where your local health department is, you can check in your phone book. The CDC also has a tool to help you locate your state's health department, and many state websites also have listings of all of their local health departments.  3) Find a Walk-in Clinic If your illness is not likely to be very severe or complicated, you may want to try a walk in clinic. These are popping up all over the country in pharmacies, drugstores, and shopping centers. They're usually staffed by nurse practitioners or physician assistants that have been trained to treat common illnesses and complaints. They're usually fairly quick and inexpensive. However, if you have serious medical issues or chronic medical problems, these are probably not your best option          Headaches, Frequently Asked Questions MIGRAINE HEADACHES Q: What is migraine? What causes it? How can I treat it? A: Generally, migraine headaches begin as a dull ache. Then they develop into a  constant, throbbing, and pulsating pain. You may experience pain at the temples. You may experience pain at the front or back of one or both sides of the head. The pain is usually accompanied by a combination of:  Nausea.   Vomiting.   Sensitivity to light and noise.  Some people (about 15%) experience an aura (see below) before an attack. The cause of migraine is believed to be chemical reactions in the brain. Treatment for migraine may include over-the-counter or prescription medications. It may also include self-help techniques. These include relaxation training and biofeedback.  Q: What is an aura? A: About 15% of people with migraine get an "aura". This is a sign of neurological symptoms that occur before a migraine headache. You may see wavy or jagged lines, dots, or flashing lights. You might experience tunnel vision or blind spots in one or both eyes. The aura can include visual or auditory hallucinations (something imagined). It may include disruptions in smell (such as strange odors), taste or touch. Other symptoms include:  Numbness.   A "pins and needles" sensation.   Difficulty in recalling or speaking the correct word.  These neurological events may last as long as 60 minutes. These symptoms will fade as the headache begins. Q: What is a trigger? A: Certain physical or environmental factors can lead to or "trigger" a migraine. These include:  Foods.   Hormonal changes.   Weather.   Stress.  It is important to remember that triggers are different for everyone. To help prevent migraine attacks, you need to figure out which triggers affect you. Keep a headache diary. This is a good way to track triggers. The diary will help you talk to your healthcare professional about your condition. Q: Does weather affect migraines? A: Bright sunshine, hot, humid conditions, and drastic changes in barometric pressure may lead to, or "trigger," a migraine attack in some people. But studies  have shown that weather does not act as a trigger for everyone with migraines. Q: What is the link between migraine and hormones? A: Hormones start and regulate many of your body's functions. Hormones keep your body in balance within a constantly changing environment. The levels of hormones in your body are unbalanced at times. Examples are during menstruation, pregnancy, or menopause. That can lead to a migraine  attack. In fact, about three quarters of all women with migraine report that their attacks are related to the menstrual cycle.  Q: Is there an increased risk of stroke for migraine sufferers? A: The likelihood of a migraine attack causing a stroke is very remote. That is not to say that migraine sufferers cannot have a stroke associated with their migraines. In persons under age 44, the most common associated factor for stroke is migraine headache. But over the course of a person's normal life span, the occurrence of migraine headache may actually be associated with a reduced risk of dying from cerebrovascular disease due to stroke.  Q: What are acute medications for migraine? A: Acute medications are used to treat the pain of the headache after it has started. Examples over-the-counter medications, NSAIDs, ergots, and triptans.  Q: What are the triptans? A: Triptans are the newest class of abortive medications. They are specifically targeted to treat migraine. Triptans are vasoconstrictors. They moderate some chemical reactions in the brain. The triptans work on receptors in your brain. Triptans help to restore the balance of a neurotransmitter called serotonin. Fluctuations in levels of serotonin are thought to be a main cause of migraine.  Q: Are over-the-counter medications for migraine effective? A: Over-the-counter, or "OTC," medications may be effective in relieving mild to moderate pain and associated symptoms of migraine. But you should see your caregiver before beginning any treatment  regimen for migraine.  Q: What are preventive medications for migraine? A: Preventive medications for migraine are sometimes referred to as "prophylactic" treatments. They are used to reduce the frequency, severity, and length of migraine attacks. Examples of preventive medications include antiepileptic medications, antidepressants, beta-blockers, calcium channel blockers, and NSAIDs (nonsteroidal anti-inflammatory drugs). Q: Why are anticonvulsants used to treat migraine? A: During the past few years, there has been an increased interest in antiepileptic drugs for the prevention of migraine. They are sometimes referred to as "anticonvulsants". Both epilepsy and migraine may be caused by similar reactions in the brain.  Q: Why are antidepressants used to treat migraine? A: Antidepressants are typically used to treat people with depression. They may reduce migraine frequency by regulating chemical levels, such as serotonin, in the brain.  Q: What alternative therapies are used to treat migraine? A: The term "alternative therapies" is often used to describe treatments considered outside the scope of conventional Western medicine. Examples of alternative therapy include acupuncture, acupressure, and yoga. Another common alternative treatment is herbal therapy. Some herbs are believed to relieve headache pain. Always discuss alternative therapies with your caregiver before proceeding. Some herbal products contain arsenic and other toxins. TENSION HEADACHES Q: What is a tension-type headache? What causes it? How can I treat it? A: Tension-type headaches occur randomly. They are often the result of temporary stress, anxiety, fatigue, or anger. Symptoms include soreness in your temples, a tightening band-like sensation around your head (a "vice-like" ache). Symptoms can also include a pulling feeling, pressure sensations, and contracting head and neck muscles. The headache begins in your forehead, temples, or  the back of your head and neck. Treatment for tension-type headache may include over-the-counter or prescription medications. Treatment may also include self-help techniques such as relaxation training and biofeedback. CLUSTER HEADACHES Q: What is a cluster headache? What causes it? How can I treat it? A: Cluster headache gets its name because the attacks come in groups. The pain arrives with little, if any, warning. It is usually on one side of the head. A tearing or bloodshot eye  and a runny nose on the same side of the headache may also accompany the pain. Cluster headaches are believed to be caused by chemical reactions in the brain. They have been described as the most severe and intense of any headache type. Treatment for cluster headache includes prescription medication and oxygen. SINUS HEADACHES Q: What is a sinus headache? What causes it? How can I treat it? A: When a cavity in the bones of the face and skull (a sinus) becomes inflamed, the inflammation will cause localized pain. This condition is usually the result of an allergic reaction, a tumor, or an infection. If your headache is caused by a sinus blockage, such as an infection, you will probably have a fever. An x-ray will confirm a sinus blockage. Your caregiver's treatment might include antibiotics for the infection, as well as antihistamines or decongestants.  REBOUND HEADACHES Q: What is a rebound headache? What causes it? How can I treat it? A: A pattern of taking acute headache medications too often can lead to a condition known as "rebound headache." A pattern of taking too much headache medication includes taking it more than 2 days per week or in excessive amounts. That means more than the label or a caregiver advises. With rebound headaches, your medications not only stop relieving pain, they actually begin to cause headaches. Doctors treat rebound headache by tapering the medication that is being overused. Sometimes your  caregiver will gradually substitute a different type of treatment or medication. Stopping may be a challenge. Regularly overusing a medication increases the potential for serious side effects. Consult a caregiver if you regularly use headache medications more than 2 days per week or more than the label advises. ADDITIONAL QUESTIONS AND ANSWERS Q: What is biofeedback? A: Biofeedback is a self-help treatment. Biofeedback uses special equipment to monitor your body's involuntary physical responses. Biofeedback monitors:  Breathing.   Pulse.   Heart rate.   Temperature.   Muscle tension.   Brain activity.  Biofeedback helps you refine and perfect your relaxation exercises. You learn to control the physical responses that are related to stress. Once the technique has been mastered, you do not need the equipment any more. Q: Are headaches hereditary? A: Four out of five (80%) of people that suffer report a family history of migraine. Scientists are not sure if this is genetic or a family predisposition. Despite the uncertainty, a child has a 50% chance of having migraine if one parent suffers. The child has a 75% chance if both parents suffer.  Q: Can children get headaches? A: By the time they reach high school, most young people have experienced some type of headache. Many safe and effective approaches or medications can prevent a headache from occurring or stop it after it has begun.  Q: What type of doctor should I see to diagnose and treat my headache? A: Start with your primary caregiver. Discuss his or her experience and approach to headaches. Discuss methods of classification, diagnosis, and treatment. Your caregiver may decide to recommend you to a headache specialist, depending upon your symptoms or other physical conditions. Having diabetes, allergies, etc., may require a more comprehensive and inclusive approach to your headache. The National Headache Foundation will provide, upon  request, a list of [facility] physician members in your state. Document Released: 02/26/2004 Document Revised: 11/25/2011 Document Reviewed: 08/05/2008 [facility] Patient Information 2012 [place], [place].

## 2012-06-12 NOTE — ED Provider Notes (Signed)
Medical screening examination/treatment/procedure(s) were conducted as a shared visit with non-physician practitioner(s) and myself.  I personally evaluated the patient during the encounter 57 year old woman with headache and known meningioma.  Exam shows her to be neurologically intact today.  CT of head unchanged; meningioma near the optic chiasm.  Rx symptomatically with pain medicine.  She will need to get a primary care physician when her Medicaid comes through.  Carleene Cooper III, MD 06/12/12 [phone]

## 2012-06-12 NOTE — ED Notes (Signed)
Pt's visualicuity was 20/800 on left and rt eye. Pt complained of bluriness.2:49pm JG

## 2012-06-12 NOTE — ED Notes (Signed)
Hx of non-operable brain tumor.  Pt reports HA x 2 days.  Pt is busy talking to her friend and I am unable to get her to focus on my questions.  Pt states MD has not prescribed anything for the HA.

## 2012-06-12 NOTE — ED Notes (Addendum)
Pt rates pain as greater than 10/10, but is laughing and loudly talking with friend in the room.  Also taking phone calls.  Pt reports that Vicodin or Percocet worked well for pain, but she ran out of it.

## 2013-03-12 ENCOUNTER — Other Ambulatory Visit: Payer: Self-pay | Admitting: Neurology

## 2013-03-12 DIAGNOSIS — D329 Benign neoplasm of meninges, unspecified: Secondary | ICD-10-CM

## 2013-03-21 ENCOUNTER — Ambulatory Visit: Payer: Medicaid Other

## 2013-03-21 ENCOUNTER — Ambulatory Visit: Payer: Medicaid Other | Admitting: Radiation Oncology

## 2013-03-21 ENCOUNTER — Ambulatory Visit
Admission: RE | Admit: 2013-03-21 | Discharge: 2013-03-21 | Disposition: A | Discharge status: Home / Self Care | Payer: Medicaid Other | Source: Ambulatory Visit | Attending: Neurology | Admitting: Neurology

## 2013-03-21 DIAGNOSIS — D329 Benign neoplasm of meninges, unspecified: Secondary | ICD-10-CM

## 2013-03-21 MED ORDER — GADOBENATE DIMEGLUMINE 529 MG/ML IV SOLN
20.0000 mL | Freq: Once | INTRAVENOUS | Status: AC | PRN
  Administered 2013-03-21: 20 mL via INTRAVENOUS

## 2013-03-26 ENCOUNTER — Encounter: Payer: Self-pay | Admitting: *Deleted

## 2013-03-27 ENCOUNTER — Other Ambulatory Visit: Payer: Self-pay | Admitting: Radiation Therapy

## 2013-03-27 ENCOUNTER — Ambulatory Visit
Admission: RE | Admit: 2013-03-27 | Discharge: 2013-03-27 | Disposition: A | Discharge status: Home / Self Care | Payer: Medicaid Other | Source: Ambulatory Visit | Attending: Radiation Oncology | Admitting: Radiation Oncology

## 2013-03-27 ENCOUNTER — Encounter: Payer: Self-pay | Admitting: Radiation Oncology

## 2013-03-27 VITALS — BP 195/101 | HR 74 | Temp 97.5°F | Wt 206.7 lb

## 2013-03-27 DIAGNOSIS — R51 Headache: Secondary | ICD-10-CM | POA: Insufficient documentation

## 2013-03-27 DIAGNOSIS — F17200 Nicotine dependence, unspecified, uncomplicated: Secondary | ICD-10-CM | POA: Insufficient documentation

## 2013-03-27 DIAGNOSIS — I10 Essential (primary) hypertension: Secondary | ICD-10-CM | POA: Insufficient documentation

## 2013-03-27 DIAGNOSIS — Z79899 Other long term (current) drug therapy: Secondary | ICD-10-CM | POA: Insufficient documentation

## 2013-03-27 DIAGNOSIS — E119 Type 2 diabetes mellitus without complications: Secondary | ICD-10-CM | POA: Insufficient documentation

## 2013-03-27 DIAGNOSIS — D320 Benign neoplasm of cerebral meninges: Secondary | ICD-10-CM

## 2013-03-27 DIAGNOSIS — D329 Benign neoplasm of meninges, unspecified: Secondary | ICD-10-CM | POA: Insufficient documentation

## 2013-03-27 DIAGNOSIS — R29898 Other symptoms and signs involving the musculoskeletal system: Secondary | ICD-10-CM | POA: Insufficient documentation

## 2013-03-27 NOTE — Progress Notes (Signed)
Radiation Oncology         [phone]) [phone] ________________________________  Initial outpatient Consultation  Name: Christine Harvey MRN: [id number]  Date: 03/27/2013  DOB: 10/26/1956  CC:  Beryle Beams, MD Quentin Mulling MD Kerri Perches MD  REFERRING PHYSICIAN: Beryle Beams, MD  DIAGNOSIS: The encounter diagnosis was Meningioma.  HISTORY OF PRESENT ILLNESS::Christine Harvey Christine Harvey is a 57 y.o. female who reports that 7 years ago at [facility] she underwent a scan of her brain due to severe headaches. Apparently she does not have a meningioma. She saw her radiation oncologist and radiation was recommended. Surgery was not recommended. However the patient was lost to followup. She attributes this to having no insurance at that time. Her headaches continued and she intermittently had to go to her emergency room for morphine. She reports that the morphine helps her headaches for about 3 days. She still goes to the emergency room intermittently with headaches get very bad. She reports that over the years her headaches have worsened. They are always present. Sometimes worse than other times. They are global.  She feels heaviness in/on her head. She also reports that her vision has been more blurry recently. She reports that her left hand and her left leg have been weak for about one year. She reports numbness of her right index finger. She denies seizures or nausea. She denies dizziness or facial pain.  She did see a neurologist for her symptoms. Her neurologist felt that her symptoms are likely secondary to her meningioma, specifically her left-sided weakness and her headaches. She tried a Medrol Dosepak which did not help her headache.  Recent 3T MRI from 03/21/13 of brain was reviewed at our Brain Tumor Conference. It showed no change since October 2012. 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 There were  chronic small vessel changes affecting the pons and cerebral hemispheric white matter. Neurosurgery did not recommend surgery. The consensus was that she would be a good candidate for stereotactic radiosurgery.    PREVIOUS RADIATION THERAPY: No  PAST MEDICAL HISTORY:  has a past medical history of Hypertension; Diabetes mellitus; Arthritis; Depression; Brain tumor (benign) (7 years); Worsening headaches; Photophobia; and Weakness of left leg.    PAST SURGICAL HISTORY: Past Surgical History  Procedure Laterality Date  . Tonsillectomy      As a child    FAMILY HISTORY: family history is not on file.  SOCIAL HISTORY:  reports that she has been smoking.  She does not have any smokeless tobacco history on file. She reports that  drinks alcohol. She reports that she does not use illicit drugs.  ALLERGIES: Sulfa antibiotics  MEDICATIONS:  Current Outpatient Prescriptions  Medication Sig Dispense Refill  . diltiazem (DILACOR XR) 180 MG 24 hr capsule Take 180 mg by mouth daily.      . fish oil-omega-3 fatty acids 1000 MG capsule Take 1 g by mouth daily.      Marland Kitchen losartan-hydrochlorothiazide (HYZAAR) 100-25 MG per tablet Take 1 tablet by mouth daily.      . meloxicam (MOBIC) 15 MG tablet Take 15 mg by mouth daily.      . metFORMIN (GLUCOPHAGE) 1000 MG tablet Take 1,000 mg by mouth 2 (two) times daily with a meal.       No current facility-administered medications for this encounter.    REVIEW OF SYSTEMS: Pertinent items are noted in HPI.  PHYSICAL EXAM:  weight is 206 lb 11.2 oz (93.759 kg). Her temperature is 97.5 F (36.4 C). Her blood pressure is 195/101 and her pulse is 74.   General: Alert and oriented, in no acute distress HEENT: Head is normocephalic. Pupils are equally round and reactive to light. Extraocular movements are intact. Oropharynx is clear. Neck: Neck is supple, no palpable cervical or supraclavicular lymphadenopathy. Heart: Regular in rate and rhythm with no murmurs,  rubs, or gallops. Chest: Clear to auscultation bilaterally, with no rhonchi, wheezes, or rales. Extremities: No cyanosis or edema in upper extremities. Lymphatics: No concerning lymphadenopathy. Skin: No concerning lesions. Musculoskeletal: 5 out of 5 strength on the right side. Left upper and lower extremities demonstrate 4+ out of 5 strength diffusely  Neurologic: Cranial nerves II through XII are grossly intact.  Speech is fluent. Coordination is intact. Visual quadrants intact. She subjectively reports numbness of the right index finger. Left Sided weakness as described above. Reflexes symmetric and normal Psychiatric: Judgment and insight are intact. Affect is appropriate.   LABORATORY DATA:  Lab Results  Component Value Date   WBC 9.0 06/30/2011   HGB 10.5* 06/30/2011   HCT 31.9* 06/30/2011   MCV 84.4 06/30/2011   PLT 319 06/30/2011   CMP     Component Value Date/Time   NA 136 06/30/2011 0444   K 3.6 06/30/2011 0444   CL 102 06/30/2011 0444   CO2 25 06/30/2011 0444   GLUCOSE 143* 06/30/2011 0444   BUN 12 06/30/2011 0444   CREATININE 0.72 06/30/2011 0444   CALCIUM 9.1 06/30/2011 0444   GFRNONAA >60 06/30/2011 0444   GFRAA >60 06/30/2011 0444         RADIOGRAPHY: Mr Laqueta Jean Wo Contrast  03/21/2013  *RADIOLOGY REPORT*  Clinical Data: Follow-up meningioma.  Memory loss.  MRI HEAD WITHOUT AND WITH CONTRAST  Technique:  Multiplanar, multiecho pulse sequences of the brain and surrounding structures were obtained according to standard protocol without and with intravenous contrast  Contrast: 20mL MULTIHANCE GADOBENATE DIMEGLUMINE 529 MG/ML IV SOLN  Comparison: Head CT 06/12/2012.  MRI 10/15/2011.  Findings: Diffusion imaging does not show any acute or subacute infarction.  A meningioma along the clivus centrally and towards the right has not enlarged, measuring 2.5 x 1.2 x 1.9 cm, with probable osseous involvement of the clivus.  There is mass effect upon the ventral pons more to the right of  midline, unchanged. There are chronic small vessel changes of the pons.  The cerebral hemispheres show mild to moderate chronic small vessel changes of the white matter.  No cortical or large vessel territory infarction.  No intra-axial mass lesion, hemorrhage, hydrocephalus or extra-axial fluid collection.  Homogeneous enhancement of the meningioma occurs after contrast administration.  IMPRESSION: No change since October 2012.  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  Chronic small vessel changes affecting the pons and cerebral hemispheric white matter.   Original Report Authenticated By: Paulina Fusi, M.D.       IMPRESSION/PLAN:  This is a lovely 57 year old woman with a meningioma along the clivus-she has significant headaches and discernible left-sided weakness as well.  Based on the tumor location and her progressive symptoms, I think she is a good candidate for strategic radiosurgery.  Because of her brainstem compression and her left-sided weakness, I think she is a good candidate for a fractionation of 25 Gray in 5 fractions. This  theoretically carries a lower risk of peritumoral edema and acute side effects compared to a single large fraction of radiosurgery.  We discussed the risks benefits and side effects of this treatment. She understands that the treatment carries risks that include but are not necessarily limited to fatigue, headaches nausea and worsening of her neurologic symptoms. The symptoms usually resolve with steroid treatment.  She is enthusiastic about proceeding. Consent form has been signed and placed in her chart. Based on the conditions of our radiologists, we will obtain a pituitary protocol MRI for treatment planning. She will be referred to Dr. Venetia Maxon of neurosurgery to participate in her treatment. I look forward to participating in her care.  I spent 45 minutes minutes face to face  with the patient and more than 50% of that time was spent in counseling and/or coordination of care.    __________________________________________   Lonie Peak, MD

## 2013-03-27 NOTE — Addendum Note (Signed)
Encounter addended by: Delynn Flavin, RN on: 03/27/2013  3:36 PM<BR>     Documentation filed: Charges VN

## 2013-03-27 NOTE — Progress Notes (Signed)
Christine Harvey in today for assessment of meningioma.  She reports blurred vision, Diffuse headaches, weakness of left arm/hand and left leg.  Reports intermittent of feeling lightheaded. She doe not have a pathology report.

## 2013-03-28 ENCOUNTER — Telehealth: Payer: Self-pay | Admitting: Radiation Oncology

## 2013-03-28 NOTE — Telephone Encounter (Signed)
Faxed consult 03/27/13 to Quentin Mulling, NP, [phone], per [facility].  Sent to [facility] to be added.  Received confirmation.

## 2013-03-30 NOTE — Addendum Note (Signed)
Encounter addended by: Delynn Flavin, RN on: 03/30/2013 10:03 AM<BR>     Documentation filed: Charges VN

## 2013-04-02 ENCOUNTER — Other Ambulatory Visit: Payer: Self-pay | Admitting: Radiation Therapy

## 2013-04-02 DIAGNOSIS — D320 Benign neoplasm of cerebral meninges: Secondary | ICD-10-CM

## 2013-04-03 ENCOUNTER — Ambulatory Visit
Admission: RE | Admit: 2013-04-03 | Discharge: 2013-04-03 | Disposition: A | Discharge status: Home / Self Care | Payer: Medicaid Other | Source: Ambulatory Visit | Attending: Radiation Oncology | Admitting: Radiation Oncology

## 2013-04-03 DIAGNOSIS — D320 Benign neoplasm of cerebral meninges: Secondary | ICD-10-CM

## 2013-04-03 MED ORDER — GADOBENATE DIMEGLUMINE 529 MG/ML IV SOLN
10.0000 mL | Freq: Once | INTRAVENOUS | Status: AC | PRN
  Administered 2013-04-03: 10 mL via INTRAVENOUS

## 2013-04-04 ENCOUNTER — Ambulatory Visit
Admission: RE | Admit: 2013-04-04 | Discharge: 2013-04-04 | Disposition: A | Discharge status: Home / Self Care | Payer: Medicaid Other | Source: Ambulatory Visit | Attending: Radiation Oncology | Admitting: Radiation Oncology

## 2013-04-04 DIAGNOSIS — Z79899 Other long term (current) drug therapy: Secondary | ICD-10-CM | POA: Insufficient documentation

## 2013-04-04 DIAGNOSIS — D329 Benign neoplasm of meninges, unspecified: Secondary | ICD-10-CM

## 2013-04-04 DIAGNOSIS — Z510 Encounter for antineoplastic radiation therapy: Secondary | ICD-10-CM | POA: Insufficient documentation

## 2013-04-04 DIAGNOSIS — R51 Headache: Secondary | ICD-10-CM | POA: Insufficient documentation

## 2013-04-04 DIAGNOSIS — D320 Benign neoplasm of cerebral meninges: Secondary | ICD-10-CM | POA: Insufficient documentation

## 2013-04-04 LAB — BUN AND CREATININE (CC13)
BUN: 19.5 mg/dL (ref 7.0–26.0)
Creatinine: 1.5 mg/dL — ABNORMAL HIGH (ref 0.6–1.1)

## 2013-04-04 NOTE — Progress Notes (Signed)
As ordered by Dr. Basilio Cairo, Ms. Pacifico did not have an IV start today because her Creatinine level, which was drawn this am, was elevated at 1.5.   Ms. Chacko notified and reasons explained.  Jackqulyn Livings in simulation notified.

## 2013-04-04 NOTE — Progress Notes (Signed)
  Radiation Oncology         [phone] ________________________________  Name: NORMAN BIER MRN: [id number]  Date: 04/04/2013  DOB: 11/23/1956  SIMULATION AND TREATMENT PLANNING NOTE; Outpatient  DIAGNOSIS:  Right Clivus Meningioma  NARRATIVE:  The patient was brought to the CT Simulation planning suite.  Identity was confirmed.  All relevant records and images related to the planned course of therapy were reviewed.  The patient freely provided informed written consent to proceed with treatment after reviewing the details related to the planned course of therapy. The consent form was witnessed and verified by the simulation staff.  Then, the patient was set-up in a stable reproducible supine position for radiation therapy.  A relocatable thermoplastic stereotactic head frame was fabricated for precise immobilization.  CT images were obtained.  Surface markings were placed.  The CT images were loaded into the planning software and fused with the patient's targeting MRI scan.  Then the target and avoidance structures were contoured.  Treatment planning then occurred.  The radiation prescription was entered and confirmed.  I have requested 3D planning  I have requested a DVH of the following structures: Brain stem, brain, left eye, right eye, lenses, optic chiasm, target volumes, uninvolved brain, and normal tissue.    PLAN:  The patient will receive 25 Gy in 5 fractions using stereotactic radiosurgery to the meningioma with 1mm expansion from GTV to PTV.  -----------------------------------  Lonie Peak, MD

## 2013-04-09 ENCOUNTER — Ambulatory Visit
Admission: RE | Admit: 2013-04-09 | Discharge: 2013-04-09 | Disposition: A | Discharge status: Home / Self Care | Payer: Medicaid Other | Source: Ambulatory Visit | Attending: Radiation Oncology | Admitting: Radiation Oncology

## 2013-04-09 VITALS — BP 158/92 | HR 79 | Temp 98.3°F | Resp 20

## 2013-04-09 DIAGNOSIS — D329 Benign neoplasm of meninges, unspecified: Secondary | ICD-10-CM

## 2013-04-09 NOTE — Progress Notes (Signed)
Follow up  Dx Meningioma, patient alert,oriented x3, steady gait, c/o sharp pain top right  Side head, continued heada cahes, and blurred vision, hasn't taken her b/p med as yet, 99% room air sats,  No sob,  Good appetite, fatigue  Some stated, no c/o nausea  pain h/a slight right now, takes tramadol prn, steady gait Most recent MRI  Brain 04/03/13   Rx for Dexamethasone given to patient ,went over instructions tapered  Doses, patient gave verbal understanding

## 2013-04-09 NOTE — Consult Note (Signed)
Initial outpatient Consultation  Name: Christine Harvey MRN: [id number]  Date: 03/27/2013 DOB: [birth year]  CC: Christine Beams, MD Quentin Mulling MD Kerri Perches MD  REFERRING PHYSICIAN: Beryle Beams, MD  DIAGNOSIS: The encounter diagnosis was Meningioma.  HISTORY OF PRESENT ILLNESS::Christine Harvey is a 57 y.o. female who reports that 7 years ago at [facility] she underwent a scan of her brain due to severe headaches. Apparently she does not have a meningioma. She saw her radiation oncologist and radiation was recommended. Surgery was not recommended. However the patient was lost to followup. She attributes this to having no insurance at that time. Her headaches continued and she intermittently had to go to her emergency room for morphine. She reports that the morphine helps her headaches for about 3 days. She still goes to the emergency room intermittently with headaches get very bad. She reports that over the years her headaches have worsened. They are always present. Sometimes worse than other times. They are global. She feels heaviness in/on her head. She also reports that her vision has been more blurry recently. She reports that her left hand and her left leg have been weak for about one year. She reports numbness of her right index finger. She denies seizures or nausea. She denies dizziness or facial pain.  She did see a neurologist for her symptoms. Her neurologist felt that her symptoms are likely secondary to her meningioma, specifically her left-sided weakness and her headaches. She tried a Medrol Dosepak which did not help her headache.  Recent 3T MRI from 03/21/13 of brain was reviewed at our Brain Tumor Conference. It showed no change since October 2012. 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 There were chronic small vessel changes affecting the pons and cerebral hemispheric white matter.  Neurosurgery did not recommend surgery. The consensus was that she would be a good candidate for stereotactic radiosurgery.  PREVIOUS RADIATION THERAPY: No  PAST MEDICAL HISTORY: has a past medical history of Hypertension; Diabetes mellitus; Arthritis; Depression; Brain tumor (benign) (7 years); Worsening headaches; Photophobia; and Weakness of left leg.  PAST SURGICAL HISTORY:  Past Surgical History   Procedure  Laterality  Date   .  Tonsillectomy       As a child    FAMILY HISTORY: family history is not on file.  SOCIAL HISTORY: reports that she has been smoking. She does not have any smokeless tobacco history on file. She reports that drinks alcohol. She reports that she does not use illicit drugs.  ALLERGIES: Sulfa antibiotics  MEDICATIONS:  Current Outpatient Prescriptions   Medication  Sig  Dispense  Refill   .  diltiazem (DILACOR XR) 180 MG 24 hr capsule  Take 180 mg by mouth daily.     .  fish oil-omega-3 fatty acids 1000 MG capsule  Take 1 g by mouth daily.     Marland Kitchen  losartan-hydrochlorothiazide (HYZAAR) 100-25 MG per tablet  Take 1 tablet by mouth daily.     .  meloxicam (MOBIC) 15 MG tablet  Take 15 mg by mouth daily.     .  metFORMIN (GLUCOPHAGE) 1000 MG tablet  Take 1,000 mg by mouth 2 (two) times daily with a meal.      No current facility-administered medications for this encounter.    REVIEW OF SYSTEMS: Pertinent items are noted in HPI.  PHYSICAL EXAM: weight is 206 lb 11.2  oz (93.759 kg). Her temperature is 97.5 F (36.4 C). Her blood pressure is 195/101 and her pulse is 74.  General: Alert and oriented, in no acute distress  HEENT: Head is normocephalic. Pupils are equally round and reactive to light. Extraocular movements are intact. Oropharynx is clear.  Neck: Neck is supple, no palpable cervical or supraclavicular lymphadenopathy.  Heart: Regular in rate and rhythm with no murmurs, rubs, or gallops.  Chest: Clear to auscultation bilaterally, with no rhonchi, wheezes,  or rales.  Extremities: No cyanosis or edema in upper extremities.  Lymphatics: No concerning lymphadenopathy.  Skin: No concerning lesions.  Musculoskeletal: 5 out of 5 strength on the right side. Left upper and lower extremities demonstrate 4+ out of 5 strength diffusely  Neurologic: Cranial nerves II through XII are grossly intact except that she has diplopia on upgaze with right eye higher than left.  This appears not to resolve with head rotation. She complains of blurred vision greater when looking to the right than the left. Speech is fluent. Coordination is intact. Visual quadrants intact. She denies facial numbness, no apparent facial weakness.  Hearing is intact to finger rub and bilaterally symmetric. She subjectively reports numbness of the right index finger. Left Sided weakness in hand intrinsics and finger extensors on the left. She also reports dragging her left leg when walking.  She is able to stand on her toes on both sides. Reflexes are brisk on the right compared to the left with down going great toes.  Positive Hoffman's sign on the left, negative on the right.  Psychiatric: Judgment and insight are intact. Affect is appropriate.  LABORATORY DATA:  Lab Results   Component  Value  Date    WBC  9.0  06/30/2011    HGB  10.5*  06/30/2011    HCT  31.9*  06/30/2011    MCV  84.4  06/30/2011    PLT  319  06/30/2011    CMP    Component  Value  Date/Time    NA  136  06/30/2011 0444    K  3.6  06/30/2011 0444    CL  102  06/30/2011 0444    CO2  25  06/30/2011 0444    GLUCOSE  143*  06/30/2011 0444    BUN  12  06/30/2011 0444    CREATININE  0.72  06/30/2011 0444    CALCIUM  9.1  06/30/2011 0444    GFRNONAA  >60  06/30/2011 0444    GFRAA  >60  06/30/2011 0444    RADIOGRAPHY: Mr Laqueta Jean Wo Contrast  03/21/2013 *RADIOLOGY REPORT* Clinical Data: Follow-up meningioma. Memory loss. MRI HEAD WITHOUT AND WITH CONTRAST Technique: Multiplanar, multiecho pulse sequences of the brain and surrounding  structures were obtained according to standard protocol without and with intravenous contrast Contrast: 20mL MULTIHANCE GADOBENATE DIMEGLUMINE 529 MG/ML IV SOLN Comparison: Head CT 06/12/2012. MRI 10/15/2011. Findings: Diffusion imaging does not show any acute or subacute infarction. A meningioma along the clivus centrally and towards the right has not enlarged, measuring 2.5 x 1.2 x 1.9 cm, with probable osseous involvement of the clivus. There is mass effect upon the ventral pons more to the right of midline, unchanged. There are chronic small vessel changes of the pons. The cerebral hemispheres show mild to moderate chronic small vessel changes of the white matter. No cortical or large vessel territory infarction. No intra-axial mass lesion, hemorrhage, hydrocephalus or extra-axial fluid collection. Homogeneous enhancement of the meningioma occurs after contrast administration.  IMPRESSION: No change since October 2012. 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 Chronic small vessel changes affecting the pons and cerebral hemispheric white matter. Original Report Authenticated By: Paulina Fusi, M.D.  IMPRESSION/PLAN:  This is a lovely 57 year old woman with a meningioma along the clivus-she has significant headaches and discernible left-sided weakness as well with visual disturbance, eye movement abnormality, hyperreflexia on the left.  Based on the tumor location and her progressive symptoms, I think she is a good candidate for strategic radiosurgery.  Because of her brainstem compression and her left-sided weakness, I think she is a good candidate for a fractionation of 25 Gray in 5 fractions. This theoretically carries a lower risk of peritumoral edema and acute side effects compared to a single large fraction of radiosurgery.  We discussed the risks benefits and side effects of this treatment. She understands that the  treatment carries risks that include but are not necessarily limited to fatigue, headaches nausea and worsening of her neurologic symptoms. The symptoms usually resolve with steroid treatment.  She is enthusiastic about proceeding. Consent form has been signed and placed in her chart. Based on the conditions of our radiologists, we will obtain a pituitary protocol MRI for treatment planning.   I spent 45 minutes minutes face to face with the patient and more than 50% of that time was spent in counseling and/or coordination of care. I reviewed her imaging with her and we discussed risks and benefits as well as expected outcomes of treatment. Decadron taper of 4 mg BID while under treatment and then 4 mg daily for one week after treatment, followed by 4 mg QOD for four days, then stop. __________________________________________    Danae Orleans. Venetia Maxon, MD

## 2013-04-10 ENCOUNTER — Ambulatory Visit: Payer: Medicaid Other | Admitting: Radiation Oncology

## 2013-04-13 ENCOUNTER — Ambulatory Visit: Payer: Medicaid Other | Admitting: Radiation Oncology

## 2013-04-16 ENCOUNTER — Ambulatory Visit: Admission: RE | Admit: 2013-04-16 | Payer: Medicaid Other | Source: Ambulatory Visit | Admitting: Radiation Oncology

## 2013-04-16 ENCOUNTER — Ambulatory Visit: Payer: Medicaid Other | Admitting: Radiation Oncology

## 2013-04-18 ENCOUNTER — Ambulatory Visit: Payer: Medicaid Other | Admitting: Radiation Oncology

## 2013-04-20 ENCOUNTER — Ambulatory Visit
Admission: RE | Admit: 2013-04-20 | Discharge: 2013-04-20 | Disposition: A | Discharge status: Home / Self Care | Payer: Medicaid Other | Source: Ambulatory Visit | Attending: Radiation Oncology | Admitting: Radiation Oncology

## 2013-04-20 VITALS — BP 157/93 | HR 87 | Temp 97.9°F

## 2013-04-20 DIAGNOSIS — D329 Benign neoplasm of meninges, unspecified: Secondary | ICD-10-CM

## 2013-04-20 MED ORDER — ONDANSETRON HCL 8 MG PO TABS: 8.0000 mg | ORAL_TABLET | Freq: Two times a day (BID) | ORAL | Status: DC | PRN

## 2013-04-20 MED ORDER — OXYCODONE-ACETAMINOPHEN 5-325 MG PO TABS: 1.0000 | ORAL_TABLET | Freq: Four times a day (QID) | ORAL | Status: DC | PRN

## 2013-04-20 NOTE — Progress Notes (Signed)
Ms. Pecora discharged by Dr. Mitzi Hansen.  She was accompanied by her sister.

## 2013-04-20 NOTE — Progress Notes (Addendum)
Christine Harvey here for post [facility] monitoring.  She is resting in the recliner with her call light in reach.  Her sister has accompanied her to the procedure.  She is alert and oriented to person, place and time.  She denies pain, dizzyness and worsening of blurred vision.  She does have a headache.  Her vitals are stable except for a bp of 157/93 .  Advised patient to notify nursing if she has any questions or concerns.  Notified Dr. Mitzi Hansen of all findings.

## 2013-04-20 NOTE — Progress Notes (Signed)
  Radiation Oncology         [phone] ________________________________  Name: RENALDA LOCKLIN MRN: [id number]  Date: 04/20/2013  DOB: July 22, 1956   SPECIAL TREATMENT PROCEDURE   3D TREATMENT PLANNING AND DOSIMETRY: The patient's radiation plan was reviewed and approved by Dr. Venetia Maxon from neurosurgery and radiation oncology prior to treatment. It showed 3-dimensional radiation distributions overlaid onto the planning CT/MRI image set. The [facility] for the target structures as well as the organs at risk were reviewed. The documentation of the 3D plan and dosimetry are filed in the radiation oncology EMR.   NARRATIVE: The patient was brought to the TrueBeam stereotactic radiation treatment machine and placed supine on the CT couch. The head frame was applied, and the patient was set up for stereotactic radiosurgery. Neurosurgery was present for the set-up and delivery   SIMULATION VERIFICATION: In the couch zero-angle position, the patient underwent Exactrac imaging using the Brainlab system with orthogonal KV images. These were carefully aligned and repeated to confirm treatment position for each of the isocenters. The Exactrac snap film verification was repeated at each couch angle.   SPECIAL TREATMENT PROCEDURE: The patient received stereotactic radiosurgery to the following targets:  Clivus meningioma target was treated using 12 IMRT beams to a prescription dose of 5 Gy. ExacTrac Snap verification was performed for each couch angle.   STEREOTACTIC TREATMENT MANAGEMENT: Following delivery, the patient was transported to nursing in stable condition and monitored for possible acute effects. Vital signs were recorded . The patient tolerated treatment without significant acute effects, and was discharged to home in stable condition.  PLAN: The patient is scheduled to receive a total of 25 gray. She will return on Monday, 04/23/2013, for her second  fraction.   ------------------------------------------------  Radene Gunning, MD, PhD

## 2013-04-23 ENCOUNTER — Other Ambulatory Visit: Payer: Self-pay | Admitting: Radiation Oncology

## 2013-04-23 ENCOUNTER — Encounter: Payer: Self-pay | Admitting: Radiation Oncology

## 2013-04-23 ENCOUNTER — Ambulatory Visit
Admission: RE | Admit: 2013-04-23 | Discharge: 2013-04-23 | Disposition: A | Discharge status: Home / Self Care | Payer: Medicaid Other | Source: Ambulatory Visit | Attending: Radiation Oncology | Admitting: Radiation Oncology

## 2013-04-23 VITALS — BP 186/96 | HR 43 | Temp 97.6°F

## 2013-04-23 DIAGNOSIS — D329 Benign neoplasm of meninges, unspecified: Secondary | ICD-10-CM

## 2013-04-23 MED ORDER — OXYCODONE-ACETAMINOPHEN 10-325 MG PO TABS: 1.0000 | ORAL_TABLET | Freq: Four times a day (QID) | ORAL | Status: DC | PRN

## 2013-04-23 NOTE — Progress Notes (Signed)
   Weekly Management Note: outpatient Current Dose:  10 Gy  Projected Dose: 25 Gy   Narrative:  The patient presents for routine under treatment assessment.  CBCT/MVCT images/Port film x-rays were reviewed.  The chart was checked. Some Headache post-tx. Percocet Rx on Friday was filled, patient needs 2-3 pills at time, concerned she will run out. Did not take BP med this AM  Physical Findings:  temperature is 97.6 F (36.4 C). Her blood pressure is 186/96 and her pulse is 43.  NAD, in chair. Conversant, alert, oriented.  Impression:  The patient is tolerating radiotherapy.  Plan:  Continue radiotherapy as planned. Chronic HAs. Rx for Oxycodone 10/325 given, 30 tabs. Patient told she needs to talk to neurologist about long term plans for headache management and this will be her last Rx for narcotics and it is not in her best interest to take these chronically.  ________________________________   Lonie Peak, M.D.

## 2013-04-23 NOTE — Progress Notes (Signed)
  Radiation Oncology         [phone]) [phone] ________________________________  Stereotactic Treatment Procedure Note  Name: Christine Harvey MRN: [id number]  Date: 04/23/2013  DOB: March 17, 1956  SPECIAL TREATMENT PROCEDURE - fraction 2 of 5, OUTPATIENT  3D TREATMENT PLANNING AND DOSIMETRY:  The patient's radiation plan was reviewed and approved by neurosurgery and radiation oncology prior to treatment.  It showed 3-dimensional radiation distributions overlaid onto the planning CT/MRI image set.  The [facility] for the target structures as well as the organs at risk were reviewed. The documentation of the 3D plan and dosimetry are filed in the radiation oncology EMR.  NARRATIVE:  Christine Harvey was brought to the TrueBeam stereotactic radiation treatment machine and placed supine on the CT couch. The head frame was applied, and the patient was set up for stereotactic radiosurgery.  Neurosurgery was present for the set-up and delivery  SIMULATION VERIFICATION:  In the couch zero-angle position, the patient underwent Exactrac imaging using the Brainlab system with orthogonal KV images.  These were carefully aligned and repeated to confirm treatment position for each of the isocenters.  The Exactrac snap film verification was repeated at each couch angle.  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  ExacTrac Snap verification was performed for each couch angle.  This constitutes a special treatment procedure due to the ablative dose delivered and the technical nature of treatment.  This highly technical modality of treatment ensures that the ablative dose is centered on the patient's tumor while sparing normal tissues from excessive dose and risk of detrimental effects.  STEREOTACTIC TREATMENT MANAGEMENT:  Following delivery, the patient was transported  to nursing in stable condition and monitored for possible acute effects.  Vital signs were recorded BP 186/96  Pulse 43  Temp(Src) 97.6 F (36.4 C). The patient tolerated treatment without significant acute effects, and was discharged to home in stable condition.    PLAN: Follow up on 04/25/13 for fraction 3 of 5 - projected dose in total - 25Gy. ________________________________   Lonie Peak, MD

## 2013-04-23 NOTE — Progress Notes (Signed)
Ms. Ulatowski completed her SRS procedure.  She denies any headaches, nausea, ataxia nor vision changes.  Currently in recliner in the nursing exam room area ( room # 1).  Her BP is elevated at 186/96 presently and she reports that she did not take her BP medication today and she ate ham this morning.  Accompanied by 2 family members.

## 2013-04-23 NOTE — Progress Notes (Signed)
Christine Harvey c/o frontal headache and mild nausea.  Currently drinking gingerale.  Report given to Dr. Basilio Cairo.   BP is even more elevated at the morning.  BP 178/103 and pulse 47.   Reviewed by Dr. Basilio Cairo and released to home in the presence of her family.  Instructed to take her BP medication as soon as she gets home.

## 2013-04-25 ENCOUNTER — Encounter: Payer: Self-pay | Admitting: Radiation Oncology

## 2013-04-25 ENCOUNTER — Ambulatory Visit
Admission: RE | Admit: 2013-04-25 | Discharge: 2013-04-25 | Disposition: A | Discharge status: Home / Self Care | Payer: Medicaid Other | Source: Ambulatory Visit | Attending: Radiation Oncology | Admitting: Radiation Oncology

## 2013-04-25 VITALS — BP 176/93 | HR 63 | Temp 98.4°F

## 2013-04-25 DIAGNOSIS — D329 Benign neoplasm of meninges, unspecified: Secondary | ICD-10-CM

## 2013-04-25 NOTE — Progress Notes (Signed)
  Radiation Oncology         [phone]) [phone] ________________________________  Stereotactic Treatment Procedure Note  Name: Christine Harvey MRN: [id number]  Date: 04/25/2013  DOB: Jan 18, 1956  SPECIAL TREATMENT PROCEDURE - fraction 3 of 5, outpatient  3D TREATMENT PLANNING AND DOSIMETRY:  The patient's radiation plan was reviewed and approved by neurosurgery and radiation oncology prior to treatment.  It showed 3-dimensional radiation distributions overlaid onto the planning CT/MRI image set.  The [facility] for the target structures as well as the organs at risk were reviewed. The documentation of the 3D plan and dosimetry are filed in the radiation oncology EMR.  NARRATIVE:  Christine Harvey was brought to the TrueBeam stereotactic radiation treatment machine and placed supine on the CT couch. The head frame was applied, and the patient was set up for stereotactic radiosurgery.  Neurosurgery was present for the set-up and delivery  SIMULATION VERIFICATION:  In the couch zero-angle position, the patient underwent Exactrac imaging using the Brainlab system with orthogonal KV images.  These were carefully aligned and repeated to confirm treatment position for each of the isocenters.  The Exactrac snap film verification was repeated at each couch angle.  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 ExacTrac Snap verification was performed for each couch angle.  This constitutes a special treatment procedure due to the ablative dose delivered and the technical nature of treatment.  This highly technical modality of treatment ensures that the ablative dose is centered on the patient's tumor while sparing normal tissues from excessive dose and risk of detrimental  effects.  STEREOTACTIC TREATMENT MANAGEMENT:  Following delivery, the patient was transported to nursing in stable condition and monitored for possible acute effects.  Vital signs were recorded BP 176/93  Pulse 63  Temp(Src) 98.4 F (36.9 C). The patient tolerated treatment without significant acute effects, and was discharged to home in stable condition.    PLAN: Follow-up for 4th treatment on Friday 04/27/13. ________________________________   Lonie Peak, MD

## 2013-04-25 NOTE — Progress Notes (Signed)
Christine Harvey is S/P SRS brain treatment.  She has a headache which she grades as a level 4 on a scale of 0-10.  She states that she has a constant headache.  She admits to blurred for the past 2-3 months.  She admits that she has difficulty picking up objects with her fingers and feels off balance when she is walking.  Decadron 4 mg po BID.  Denies ay nausea presently.  She is accompanied by her sister today.

## 2013-04-27 ENCOUNTER — Ambulatory Visit
Admission: RE | Admit: 2013-04-27 | Discharge: 2013-04-27 | Disposition: A | Discharge status: Home / Self Care | Payer: Medicaid Other | Source: Ambulatory Visit | Attending: Radiation Oncology | Admitting: Radiation Oncology

## 2013-04-27 VITALS — BP 151/86 | HR 74 | Temp 97.8°F

## 2013-04-27 DIAGNOSIS — D329 Benign neoplasm of meninges, unspecified: Secondary | ICD-10-CM

## 2013-04-27 NOTE — Progress Notes (Signed)
  Radiation Oncology         [phone]) [phone] ________________________________  Stereotactic Treatment Procedure Note  Name: JORDAN CARAVEO MRN: [id number]  Date: 04/27/2013  DOB: January 23, 1956  SPECIAL TREATMENT PROCEDURE- 4th of 5 fractions, outpatient  3D TREATMENT PLANNING AND DOSIMETRY:  The patient's radiation plan was reviewed and approved by neurosurgery and radiation oncology prior to treatment.  It showed 3-dimensional radiation distributions overlaid onto the planning CT/MRI image set.  The [facility] for the target structures as well as the organs at risk were reviewed. The documentation of the 3D plan and dosimetry are filed in the radiation oncology EMR.  NARRATIVE:  LYAN HOLCK was brought to the TrueBeam stereotactic radiation treatment machine and placed supine on the CT couch. The head frame was applied, and the patient was set up for stereotactic radiosurgery.  Neurosurgery was present for the set-up and delivery  SIMULATION VERIFICATION:  In the couch zero-angle position, the patient underwent Exactrac imaging using the Brainlab system with orthogonal KV images.  These were carefully aligned and repeated to confirm treatment position for each of the isocenters.  The Exactrac snap film verification was repeated at each couch angle.  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 ExacTrac Snap verification was performed for each couch angle.  This constitutes a special treatment procedure due to the ablative dose delivered and the technical nature of treatment.  This highly technical modality of treatment ensures that the ablative dose is centered on the patient's tumor while sparing normal tissues from excessive dose and risk of detrimental effects.  STEREOTACTIC TREATMENT MANAGEMENT:  Following delivery, the patient was transported  to nursing in stable condition and monitored for possible acute effects.  Vital signs were recorded BP 151/86  Pulse 74  Temp(Src) 97.8 F (36.6 C)  SpO2 99%. The patient tolerated treatment without significant acute effects, and was discharged to home in stable condition.    PLAN: Follow-up next week for last treatment. ________________________________   Lonie Peak, MD

## 2013-04-28 ENCOUNTER — Ambulatory Visit
Admission: RE | Admit: 2013-04-28 | Discharge: 2013-04-28 | Disposition: A | Discharge status: Home / Self Care | Payer: Medicaid Other | Source: Ambulatory Visit | Attending: Radiation Oncology | Admitting: Radiation Oncology

## 2013-04-30 ENCOUNTER — Encounter: Payer: Self-pay | Admitting: Radiation Oncology

## 2013-04-30 ENCOUNTER — Ambulatory Visit
Admission: RE | Admit: 2013-04-30 | Discharge: 2013-04-30 | Disposition: A | Discharge status: Home / Self Care | Payer: Medicaid Other | Source: Ambulatory Visit | Attending: Radiation Oncology | Admitting: Radiation Oncology

## 2013-04-30 VITALS — BP 186/100 | HR 71 | Temp 98.2°F | Ht 64.0 in | Wt 206.0 lb

## 2013-04-30 DIAGNOSIS — D329 Benign neoplasm of meninges, unspecified: Secondary | ICD-10-CM

## 2013-04-30 DIAGNOSIS — B370 Candidal stomatitis: Secondary | ICD-10-CM

## 2013-04-30 MED ORDER — FLUCONAZOLE 100 MG PO TABS: ORAL_TABLET | ORAL | Status: DC

## 2013-04-30 NOTE — Progress Notes (Signed)
Ms. Jumper completed her last SRS procedure today.  She continues to have a constant headache, but she states it is not worse than, what she considers, is the normal headache she always experiences.   She denies any nausea, nor ataxia.  She admits to having blurred vision which has not changed since noted prior to the start of her SRS regimen.  Note thrush in the left buccal mucosa region.  She states she saw. this on her tongue and brushed it off.   She is on a tapered dose of Decadron and is currently taking 4 mg BID.

## 2013-04-30 NOTE — Progress Notes (Signed)
  Radiation Oncology         [phone]) [phone] ________________________________  Name: Christine Harvey MRN: [id number]  Date: 04/30/2013  DOB: 1956/11/01  Stereotactic Body Radiotherapy Treatment Procedure Note - 5TH OF 5 FRACTIONS. TOTAL DOSE: 25 Gy in 5 fractions  NARRATIVE:  Christine Harvey was brought to the stereotactic radiation treatment machine and placed supine on the CT couch. The patient was set up for stereotactic body radiotherapy on the body fix pillow.  3D TREATMENT PLANNING AND DOSIMETRY:  The patient's radiation plan was reviewed and approved prior to starting treatment.  It showed 3-dimensional radiation distributions overlaid onto the planning CT.  The [facility] for the target structures as well as the organs at risk were reviewed. The documentation of this is filed in the radiation oncology EMR.  SIMULATION VERIFICATION:  The patient underwent CT imaging on the treatment unit.  These were carefully aligned to document that the ablative radiation dose would cover the target volume and maximally spare the nearby organs at risk according to the planned distribution.  SPECIAL TREATMENT PROCEDURE: Christine Harvey received high dose ablative stereotactic body radiotherapy to the planned target volume without unforeseen complications. Treatment was delivered uneventfully. The high doses associated with stereotactic body radiotherapy and the significant potential risks require careful treatment set up and patient monitoring constituting a special treatment procedure.  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 ExacTrac Snap verification was performed for each couch angle.  STEREOTACTIC TREATMENT MANAGEMENT:  Following delivery, the patient was evaluated clinically. The patient tolerated treatment without significant acute effects, and was discharged to home in stable condition.    Vitals - 1 value per visit 04/30/2013  SYSTOLIC 186    DIASTOLIC 100  PULSE 71  TEMPERATURE 98.2  RESPIRATIONS   Weight (lb) 206  HEIGHT 5\' 4"   BMI 35.34   PLAN: Patient examined and noted to have oral thrush. She is on a Dexamethasone taper by her neurosurgeon. I will prescribe fluconazole.   She inquired as to getting a refill on her Percocet.   I reiterated the importance of seeing her neurologist for headache management - possibly with drugs other than narcotics. She understands, as discussed before, that her headaches may be chronic in spite of radiation, due to the fact that meningiomas to not "melt away" but that they leave scar tissue. She reported that her next f/u wasn't for 1 month. I called her neurologist's office and they can work her in this week. They will call the pt to arrange. I notified Ms. Wehrli of this.    I will see her back in my clinic for 1 month f/u, and she was encouraged to call if questions arise in the interim.    ________________________________  Lonie Peak, MD

## 2013-05-02 DIAGNOSIS — Z923 Personal history of irradiation: Secondary | ICD-10-CM

## 2013-05-02 HISTORY — DX: Personal history of irradiation: Z92.3

## 2013-06-04 ENCOUNTER — Ambulatory Visit
Admission: RE | Admit: 2013-06-04 | Discharge: 2013-06-04 | Disposition: A | Discharge status: Home / Self Care | Payer: Medicaid Other | Source: Ambulatory Visit | Attending: Radiation Oncology | Admitting: Radiation Oncology

## 2013-06-04 ENCOUNTER — Encounter: Payer: Self-pay | Admitting: Radiation Oncology

## 2013-06-04 VITALS — BP 163/92 | HR 118 | Temp 97.6°F | Resp 20 | Wt 201.3 lb

## 2013-06-04 DIAGNOSIS — D329 Benign neoplasm of meninges, unspecified: Secondary | ICD-10-CM

## 2013-06-04 NOTE — Progress Notes (Signed)
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         Symptomatically, patient complains of continued headaches. She has been prescribed oxycodone acetaminophen for these by neurology. She had an episode of night sweats and cramping throughout both legs a few days ago but this has not been a recurrent issue for her. She continues to have blurred vision, and left UE/LE weakness that is stable.      ALLERGIES:  is allergic to sulfa antibiotics.  Meds: Current Outpatient Prescriptions  Medication Sig Dispense Refill  . atenolol (TENORMIN) 25 MG tablet Take 25 mg by mouth daily.      . fish oil-omega-3 fatty acids 1000 MG capsule Take 1 g by mouth daily.      . insulin glargine (LANTUS) 100 UNIT/ML injection Inject 10 Units into the skin at bedtime.      Marland Kitchen losartan-hydrochlorothiazide (HYZAAR) 100-25 MG per tablet Take 1 tablet by mouth daily.      . meloxicam (MOBIC) 15 MG tablet Take 15 mg by mouth daily.      . metFORMIN (GLUCOPHAGE) 1000 MG tablet Take 1,000 mg by mouth 2 (two) times daily with a meal.      . oxyCODONE-acetaminophen (PERCOCET) 10-325 MG per tablet Take 1 tablet by mouth every 6 (six) hours as needed for pain. Take only when headaches come on. Discuss long term headache management with neurologist.  30 tablet  0  . traMADol (ULTRAM) 50 MG tablet Take 50 mg by mouth every 8 (eight) hours as needed for pain.      Marland Kitchen dexamethasone (DECADRON) 4 MG tablet Take 4 mg by mouth 2 (two) times daily with a meal. Take 1 4mg  tab oral 2x day x 2 weeks, then 4mg  tab every day x 1 week, then 4mg  tab every other day x 4  days then stop      . fluconazole (DIFLUCAN) 100 MG tablet TAKE 2 TABS TODAY, THEN 1 TAB DAILY X 20 MORE DAYS.  22 tablet  0  . ondansetron (ZOFRAN) 8 MG tablet Take 1 tablet (8 mg total) by mouth every 12 (twelve) hours as needed for nausea.  20 tablet  0   No current facility-administered medications for this encounter.    Physical Findings: The patient is in no acute distress. Patient is alert and oriented.  weight is 201 lb 4.8 oz (91.309 kg). Her oral temperature is 97.6 F (36.4 C). Her blood pressure is 163/92 and her pulse is 118. Her respiration is 20 and oxygen saturation is 100%. .  No significant changes. . No oral thrush. Vision grossly intact. Finger to nose testing intact. Ambulatory. No sensory deficits. CV II-XII grossly intact. 5 out of 5 strength on the right side. Left upper and lower extremities demonstrate 4+ out of 5 strength diffusely    Lab Findings: Lab Results  Component Value Date   WBC 9.0 06/30/2011   HGB 10.5* 06/30/2011   HCT 31.9* 06/30/2011   MCV 84.4 06/30/2011   PLT 319 06/30/2011       Radiographic Findings: No results found.  Impression/Plan:  Doing well.  Symptoms stable, as expected with a low grade meningioma post SRS.  Will obtain MRI of brain in ~2 mo and she will f/u with me and neurosurgeon at that time.  She knows to call if ?s arise in the interim.  I spent 15 minutes minutes face to face with the patient and more than 50% of that time was spent in counseling and/or coordination of care. _____________________________________   Lonie Peak, MD

## 2013-06-04 NOTE — Progress Notes (Signed)
Patient  here follow up  S/p rad txs last tx 04/30/13, right clivus meningioma  Alert,oriented x3, still has headaches daily today pain a 7 on 1-10 scale, hasn't taken percocet pain med as yet today, , still has blurred vision, had eyes checked with new rx, now on lantus insulin 10mg  daily , also new med atenolol daily , no c/o nausea,sob, 100% room air sats 8:31 AM

## 2013-06-07 ENCOUNTER — Other Ambulatory Visit: Payer: Self-pay | Admitting: Radiation Therapy

## 2013-06-07 DIAGNOSIS — D320 Benign neoplasm of cerebral meninges: Secondary | ICD-10-CM

## 2013-07-11 NOTE — Progress Notes (Addendum)
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 ExacTrac Snap verification was performed for each couch angle.   Fractionated Stereotactic Radiosurgery Technique.            NARRATIVE:   She tolerated treatment relatively well with no acute complications.                        PLAN: Routine followup in one month. Patient instructed to call if questions or worsening complaints in interim.

## 2013-07-27 ENCOUNTER — Other Ambulatory Visit: Payer: Medicaid Other

## 2013-07-30 ENCOUNTER — Ambulatory Visit: Admission: RE | Admit: 2013-07-30 | Payer: Medicaid Other | Source: Ambulatory Visit | Admitting: Radiation Oncology

## 2013-07-30 ENCOUNTER — Ambulatory Visit: Payer: Medicaid Other

## 2013-08-06 ENCOUNTER — Other Ambulatory Visit: Payer: Self-pay | Admitting: Radiation Therapy

## 2013-08-06 DIAGNOSIS — D320 Benign neoplasm of cerebral meninges: Secondary | ICD-10-CM

## 2013-08-09 ENCOUNTER — Ambulatory Visit
Admission: RE | Admit: 2013-08-09 | Discharge: 2013-08-09 | Disposition: A | Discharge status: Home / Self Care | Payer: Medicaid Other | Source: Ambulatory Visit | Attending: Radiation Oncology | Admitting: Radiation Oncology

## 2013-08-09 DIAGNOSIS — D320 Benign neoplasm of cerebral meninges: Secondary | ICD-10-CM

## 2013-08-09 LAB — BUN AND CREATININE (CC13)
BUN: 10.2 mg/dL (ref 7.0–26.0)
Creatinine: 1.1 mg/dL (ref 0.6–1.1)

## 2013-08-09 MED ORDER — GADOBENATE DIMEGLUMINE 529 MG/ML IV SOLN
20.0000 mL | Freq: Once | INTRAVENOUS | Status: AC | PRN
  Administered 2013-08-09: 20 mL via INTRAVENOUS

## 2013-08-10 ENCOUNTER — Inpatient Hospital Stay: Admission: RE | Admit: 2013-08-10 | Payer: Medicaid Other | Source: Ambulatory Visit

## 2013-08-15 ENCOUNTER — Ambulatory Visit
Admission: RE | Admit: 2013-08-15 | Discharge: 2013-08-15 | Disposition: A | Discharge status: Home / Self Care | Payer: Medicaid Other | Source: Ambulatory Visit | Attending: Radiation Oncology | Admitting: Radiation Oncology

## 2013-08-15 ENCOUNTER — Encounter: Payer: Self-pay | Admitting: Radiation Oncology

## 2013-08-15 DIAGNOSIS — D329 Benign neoplasm of meninges, unspecified: Secondary | ICD-10-CM

## 2013-08-15 NOTE — Progress Notes (Signed)
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   Symptomatically, patient complains of worsening/continued left temporal headaches. She reports she is taking hydrocodone as Rx'd by her neurologist for these. She reports stable left UE/LE weakness.  Energy is stable but she is often tired. No cold intolerance. No other new complaints. MRI of her Brain from 8-21 was reviewed at the CNS tumor board today.  We showed it to the patient, and it demonstrates slight interval decrease in the tumor size. No new intracranial abnormality.    Physical Findings: The patient is in no acute distress. Patient is alert and oriented.  vitals were not taken for this visit..  NAD, General: Alert and oriented, in no acute distress HEENT: Head is normocephalic. Pupils are equally round and reactive to light. Extraocular movements are intact. Oropharynx is clear. Neck: Neck is supple, no palpable cervical or supraclavicular lymphadenopathy. Extremities: No cyanosis or edema. Musculoskeletal: slightly decreased strength throughout left UE/LE.  Most notable in Left hand grip. Neurologic: Cranial nerves II through XII are grossly intact.   Speech is fluent. Coordination is intact. Reflexes intact, symmetric. Ambulatory. Psychiatric: Judgment and insight are intact. Affect is appropriate.    Lab Findings: Lab Results  Component Value Date   WBC 9.0 06/30/2011   HGB 10.5* 06/30/2011   HCT 31.9* 06/30/2011   MCV 84.4 06/30/2011   PLT 319 06/30/2011    Radiographic Findings: Mr Laqueta Jean OZ  Contrast  08/09/2013   CLINICAL DATA:  57 year old female with dorsal clivus meningioma status post stereotactic radiosurgery treatment.  EXAM: MRI HEAD WITHOUT AND WITH CONTRAST  TECHNIQUE: Multiplanar, multiecho pulse sequences of the brain and surrounding structures were obtained according to standard protocol without and with intravenous contrast  CONTRAST:  20mL MULTIHANCE GADOBENATE DIMEGLUMINE 529 MG/ML IV SOLN  COMPARISON:  Pretreatment studies 04/03/2013 and earlier.  FINDINGS: Oval homogeneously enhancing extra-axial mass re- identified in the pre pontine cistern inseparable from the dorsal clivus, and laterally displacing the basilar artery to the left. This measures up to 13 x 21 x 25 mm today (15 x 23 x 26 mm at a comparable level on 04/03/2013).  Mass effect on the adjacent brainstem is not significantly changed. Stable MRI appearance of the cavernous sinus. Patchy T2 hyperintensity in the pons has not significantly changed. Major intracranial vascular flow voids are stable. Stable diffusion signal within the mass. Stable to mildly increased T1 bone marrow signal in the clivus. Stable bone marrow signal elsewhere. Suprasellar cistern and pituitary remain normal  No restricted diffusion or evidence of acute infarction. Ventricular size and configuration are within normal limits. No supratentorial mass effect. No acute intracranial hemorrhage identified. No other abnormal intracranial enhancement identified. Stable scattered nonspecific cerebral white matter T2 and FLAIR hyperintensity.  Visualized orbit soft tissues are within normal limits. Stable paranasal sinuses and mastoids, largely well pneumatized. Visualized scalp soft tissues are within normal limits.  IMPRESSION: 1. Stable to slightly decreased size of the retro pliable meningioma.  2. Otherwise stable posterior fossa with no significant change and brainstem mass  effect and signal. Stable cavernous sinus. Stable clivus.  3. No new  intracranial abnormality.   Electronically Signed   By: Augusto Gamble   On: 08/09/2013 11:40    Impression/Plan:  Doing well radiographically, and stable clinically except for increased HA's.  We recommended that she discuss strategies for HA management with Dr. Gerilyn Pilgrim and to bring in a list of all meds in the past that she has tried for her HAs.  There may be an alternative to narcotics that would be more effective and have less chance of dependence or rebound HAs.  We will defer to his expertise.  Dr. Venetia Maxon will see her back in 3 mo with f/u MRI of brain and screening TSH/T4.  The pituitary gland was well spared but did get a low dose of radiation (unlikely to be enough to suppress its function.)  In any case, hypothyroidism is relatively common, regardless of the therapy for her meningioma.  I spent 15 minutes face to face with the patient and more than 50% of that time was spent in counseling and/or coordination of care. _____________________________________   Lonie Peak, MD

## 2013-08-24 ENCOUNTER — Other Ambulatory Visit: Payer: Self-pay | Admitting: Radiation Therapy

## 2013-08-24 DIAGNOSIS — D329 Benign neoplasm of meninges, unspecified: Secondary | ICD-10-CM

## 2013-08-24 DIAGNOSIS — D320 Benign neoplasm of cerebral meninges: Secondary | ICD-10-CM

## 2013-09-11 ENCOUNTER — Other Ambulatory Visit: Payer: Self-pay | Admitting: *Deleted

## 2013-11-09 ENCOUNTER — Ambulatory Visit
Admission: RE | Admit: 2013-11-09 | Discharge: 2013-11-09 | Disposition: A | Discharge status: Home / Self Care | Payer: Medicaid Other | Source: Ambulatory Visit | Attending: Radiation Oncology | Admitting: Radiation Oncology

## 2013-11-09 DIAGNOSIS — D329 Benign neoplasm of meninges, unspecified: Secondary | ICD-10-CM

## 2013-11-09 LAB — TSH CHCC: TSH: 1.49 m(IU)/L (ref 0.308–3.960)

## 2013-11-09 LAB — BUN AND CREATININE (CC13)
BUN: 14.4 mg/dL (ref 7.0–26.0)
Creatinine: 1.1 mg/dL (ref 0.6–1.1)

## 2013-11-09 LAB — T4: T4, Total: 7.8 ug/dL (ref 5.0–12.5)

## 2013-11-12 ENCOUNTER — Ambulatory Visit
Admission: RE | Admit: 2013-11-12 | Discharge: 2013-11-12 | Disposition: A | Discharge status: Home / Self Care | Payer: Medicaid Other | Source: Ambulatory Visit | Attending: Radiation Oncology | Admitting: Radiation Oncology

## 2013-11-12 DIAGNOSIS — D320 Benign neoplasm of cerebral meninges: Secondary | ICD-10-CM

## 2013-11-12 MED ORDER — GADOBENATE DIMEGLUMINE 529 MG/ML IV SOLN
20.0000 mL | Freq: Once | INTRAVENOUS | Status: AC | PRN
  Administered 2013-11-12: 20 mL via INTRAVENOUS

## 2013-11-14 ENCOUNTER — Encounter: Payer: Self-pay | Admitting: Radiation Oncology

## 2013-11-14 NOTE — Progress Notes (Addendum)
Radiation Oncology         [phone] ________________________________  Name: Christine Harvey MRN: [id number]  Date: 11/14/2013  DOB: [birth year]  Multidisciplinary Neuro Oncology Clinic Follow-Up Visit Note  CC: Pcp Not In System  No ref. 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 Venetia Maxon from neurosurgery.  The recent films were presented in our multidisciplinary conference with neuroradiology just prior to the clinic.  She still has some headaches and weakness.                              ALLERGIES:  is allergic to sulfa antibiotics.  Meds: Current Outpatient Prescriptions  Medication Sig Dispense Refill  . atenolol (TENORMIN) 25 MG tablet Take 25 mg by mouth daily.      Marland Kitchen dexamethasone (DECADRON) 4 MG tablet Take 4 mg by mouth 2 (two) times daily with a meal. Take 1 4mg  tab oral 2x day x 2 weeks, then 4mg  tab every day x 1 week, then 4mg  tab every other day x 4 days then stop      . fish oil-omega-3 fatty acids 1000 MG capsule Take 1 g by mouth daily.      . fluconazole (DIFLUCAN) 100 MG tablet TAKE 2 TABS TODAY, THEN 1 TAB DAILY X 20 MORE DAYS.  22 tablet  0  . insulin glargine (LANTUS) 100 UNIT/ML injection Inject 10 Units into the skin at bedtime.      Marland Kitchen losartan-hydrochlorothiazide (HYZAAR) 100-25 MG per tablet Take 1 tablet by mouth daily.      . meloxicam (MOBIC) 15 MG tablet Take 15 mg by mouth daily.      . metFORMIN (GLUCOPHAGE) 1000 MG tablet Take 1,000 mg by mouth 2 (two) times daily with a meal.      . ondansetron (ZOFRAN) 8 MG tablet Take 1 tablet (8 mg total) by mouth every 12 (twelve) hours as needed for nausea.  20 tablet  0  . oxyCODONE-acetaminophen (PERCOCET) 10-325 MG per tablet Take 1 tablet by mouth every 6  (six) hours as needed for pain. Take only when headaches come on. Discuss long term headache management with neurologist.  30 tablet  0  . traMADol (ULTRAM) 50 MG tablet Take 50 mg by mouth every 8 (eight) hours as needed for pain.       No current facility-administered medications for this visit.    Physical Findings: The patient is in no acute distress. Patient is alert and oriented. Right hand weakness noted on neuro exam by Dr. Venetia Maxon.  No significant changes.  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 7 month restaging.  EXAM: MRI HEAD WITHOUT AND WITH CONTRAST  TECHNIQUE: Multiplanar, multiecho pulse sequences of the brain and surrounding structures were obtained without and with intravenous contrast.  CONTRAST:  20mL MULTIHANCE GADOBENATE DIMEGLUMINE 529 MG/ML IV SOLN  COMPARISON:  08/09/2013  FINDINGS: Homogeneously enhancing extra-axial mass associated with the dorsal clivus and located in the prepontine cistern has not significantly changed in size, measuring 1.2 x 2.2 x 2.5 cm. Mass effect on the ventral pons is unchanged, as is mild leftward displacement of the basilar artery.  There is no evidence of acute infarct, intracranial hemorrhage, or extra-axial fluid collection. Small, scattered foci of T2 hyperintensity within the subcortical and deep cerebral white matter bilaterally do not appear  significantly changed and are compatible with mild chronic small vessel ischemic disease. Major intracranial flow voids are unremarkable. Small posterior left ethmoid air cell mucous retention cyst is unchanged.  IMPRESSION: Unchanged appearance of retroclival meningioma.   Electronically Signed   By: Sebastian Ache   On: 11/12/2013 11:23    Impression:  The patient is recovering from the effects of radiation.  She still has some symptoms, but radiographic improvement.  Plan:  MRI in 6 months then follow-up.  Headache pain meds per Dr. Malen Gauze.  _____________________________________  Artist Pais. Kathrynn Running, M.D. and  Maeola Harman, M.D.

## 2014-01-01 ENCOUNTER — Other Ambulatory Visit: Payer: Self-pay | Admitting: Radiation Therapy

## 2014-01-01 DIAGNOSIS — D320 Benign neoplasm of cerebral meninges: Secondary | ICD-10-CM

## 2014-04-26 ENCOUNTER — Other Ambulatory Visit: Payer: Self-pay | Admitting: Radiation Therapy

## 2014-04-26 DIAGNOSIS — D320 Benign neoplasm of cerebral meninges: Secondary | ICD-10-CM

## 2014-05-08 ENCOUNTER — Encounter: Payer: Self-pay | Admitting: Radiation Oncology

## 2014-05-14 ENCOUNTER — Ambulatory Visit
Admission: RE | Admit: 2014-05-14 | Discharge: 2014-05-14 | Disposition: A | Discharge status: Home / Self Care | Payer: Medicaid Other | Source: Ambulatory Visit | Attending: Radiation Oncology | Admitting: Radiation Oncology

## 2014-05-14 ENCOUNTER — Encounter (INDEPENDENT_AMBULATORY_CARE_PROVIDER_SITE_OTHER): Payer: Self-pay

## 2014-05-14 DIAGNOSIS — D320 Benign neoplasm of cerebral meninges: Secondary | ICD-10-CM

## 2014-05-14 LAB — BUN AND CREATININE (CC13)
BUN: 15.6 mg/dL (ref 7.0–26.0)
Creatinine: 1.4 mg/dL — ABNORMAL HIGH (ref 0.6–1.1)

## 2014-05-14 MED ORDER — GADOBENATE DIMEGLUMINE 529 MG/ML IV SOLN
18.0000 mL | Freq: Once | INTRAVENOUS | Status: AC | PRN
  Administered 2014-05-14: 18 mL via INTRAVENOUS

## 2014-05-15 ENCOUNTER — Ambulatory Visit: Payer: Medicaid Other | Admitting: Radiation Oncology

## 2014-05-15 ENCOUNTER — Ambulatory Visit: Admission: RE | Admit: 2014-05-15 | Payer: Medicaid Other | Source: Ambulatory Visit | Admitting: Radiation Oncology

## 2014-05-15 HISTORY — DX: Personal history of irradiation: Z92.3

## 2014-05-17 ENCOUNTER — Other Ambulatory Visit: Payer: Self-pay | Admitting: Radiation Therapy

## 2014-05-17 DIAGNOSIS — D320 Benign neoplasm of cerebral meninges: Secondary | ICD-10-CM

## 2014-05-17 NOTE — Progress Notes (Signed)
Christine Harvey did not make it to her follow-up to see Dr. Isidore Moos on 5/27 due to transportation issues. I asked Dr. Isidore Moos if it would be OK to share her imaging results over the phone instead of bringing her in. Dr. Isidore Moos said that was fine and asked that a 6 mo MRI and follow-up visit be scheduled. Her MRI will be 11/12 and to see Dr. Isidore Moos and Vertell Limber on Monday 11/04/14.

## 2014-09-10 ENCOUNTER — Other Ambulatory Visit: Payer: Self-pay | Admitting: *Deleted

## 2014-09-16 ENCOUNTER — Other Ambulatory Visit: Payer: Self-pay | Admitting: Radiation Therapy

## 2014-09-16 DIAGNOSIS — D320 Benign neoplasm of cerebral meninges: Secondary | ICD-10-CM

## 2014-10-31 ENCOUNTER — Ambulatory Visit
Admission: RE | Admit: 2014-10-31 | Discharge: 2014-10-31 | Disposition: A | Discharge status: Home / Self Care | Payer: Medicaid Other | Source: Ambulatory Visit | Attending: Radiation Oncology | Admitting: Radiation Oncology

## 2014-10-31 DIAGNOSIS — D320 Benign neoplasm of cerebral meninges: Secondary | ICD-10-CM

## 2014-10-31 LAB — BUN AND CREATININE (CC13)
BUN: 14.1 mg/dL (ref 7.0–26.0)
Creatinine: 1.1 mg/dL (ref 0.6–1.1)

## 2014-10-31 MED ORDER — GADOBENATE DIMEGLUMINE 529 MG/ML IV SOLN
19.0000 mL | Freq: Once | INTRAVENOUS | Status: AC | PRN
  Administered 2014-10-31: 19 mL via INTRAVENOUS

## 2014-11-03 ENCOUNTER — Encounter: Payer: Self-pay | Admitting: Radiation Therapy

## 2014-11-04 ENCOUNTER — Ambulatory Visit
Admission: RE | Admit: 2014-11-04 | Discharge: 2014-11-04 | Disposition: A | Discharge status: Home / Self Care | Payer: Medicaid Other | Source: Ambulatory Visit | Attending: Radiation Oncology | Admitting: Radiation Oncology

## 2014-11-04 ENCOUNTER — Encounter: Payer: Self-pay | Admitting: Radiation Oncology

## 2014-11-04 ENCOUNTER — Ambulatory Visit: Admission: RE | Admit: 2014-11-04 | Payer: Medicaid Other | Source: Ambulatory Visit

## 2014-11-04 VITALS — BP 157/74 | HR 77 | Temp 97.6°F | Resp 12 | Wt 188.1 lb

## 2014-11-04 DIAGNOSIS — D329 Benign neoplasm of meninges, unspecified: Secondary | ICD-10-CM

## 2014-11-04 NOTE — Progress Notes (Signed)
She rates her pain as a 6 on a scale of 0-10. Reports having "heavy/pressure" headaches over the top of her head, always. Pt complains of, Loss of Sleep, Fatigue, Generalized Weakness and Poor Appetite. Has lost approx 12 lbs, pt reports she is eating as well. Pt alert & oriented x 3 with fluent speech, upper extremities equally strong, noted slight weakness over the left lower extremity. Pt reports they have fallen twice in the last month, she is not sure why she fell, she thinks maybe due to balance.  She received bumps and scrapes on both events, she "busted" her lip and has a scar on the right side of her face. Pt reports visual blurring in both eyes PERRLA, denies auditory abnormalities. Pt presenting appropriate quality, quantity and organization of sentences, slow to respond. Oral exam reveals moist mucous membranes.

## 2014-11-04 NOTE — Progress Notes (Signed)
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  The recent films were presented in our multidisciplinary conference with neuroradiology just prior to the clinic. She feels her HAs are worse; they are still managed in neurology. She rates her pain as a 6 on a scale of 0-10. Reports having "heavy/pressure" headaches over the top of her head, always. She reports they have fallen twice in the last month, she is not sure why she fell.  No blackouts or ETOH at the times of falling, she reports. No dizziness.  Feels weaker on left side (chronic issue) . ALLERGIES:  is allergic to sulfa antibiotics.  Meds: Current Outpatient Prescriptions  Medication Sig Dispense Refill  . fish oil-omega-3 fatty acids 1000 MG capsule Take 1 g by mouth daily.    . insulin glargine (LANTUS) 100 UNIT/ML injection Inject 40 Units into the skin at bedtime.     Marland Kitchen losartan-hydrochlorothiazide (HYZAAR) 100-25 MG per tablet Take 1 tablet by mouth daily.    . metFORMIN (GLUCOPHAGE) 1000 MG tablet Take 500 mg by mouth 2 (two) times daily with a meal.     . oxyCODONE-acetaminophen (PERCOCET) 10-325 MG per tablet Take 1 tablet by mouth every 6 (six) hours as needed for pain. Take only when headaches come on. Discuss long term headache management with neurologist. 30 tablet 0  . atenolol (TENORMIN) 25 MG tablet Take 25 mg by mouth daily.    Marland Kitchen dexamethasone (DECADRON) 4 MG tablet Take 4 mg by mouth 2 (two) times daily with a meal. Take 1 4mg  tab oral 2x day x 2 weeks, then 4mg  tab every day x 1 week, then 4mg   tab every other day x 4 days then stop    . fluconazole (DIFLUCAN) 100 MG tablet TAKE 2 TABS TODAY, THEN 1 TAB DAILY X 20 MORE DAYS. 22 tablet 0  . meloxicam (MOBIC) 15 MG tablet Take 15 mg by mouth daily.    . ondansetron (ZOFRAN) 8 MG tablet Take 1 tablet (8 mg total) by mouth every 12 (twelve) hours as needed for nausea. 20 tablet 0  . traMADol (ULTRAM) 50 MG tablet Take 50 mg by mouth every 8 (eight) hours as needed for pain.     No current facility-administered medications for this encounter.    Physical Findings: Vitals with BMI 11/04/2014  Height      BMI   Systolic 258  Diastolic 74  Pulse 77  Respirations 12   The patient is in no acute distress. Patient is alert and oriented. Slight LUE weakness, subtle. Cranial nerves intact. Gait stable.  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 7 month restaging.  EXAM: MRI HEAD WITHOUT AND WITH CONTRAST  TECHNIQUE: Multiplanar, multiecho pulse sequences of the brain and surrounding structures were obtained without and with intravenous contrast.  CONTRAST:  74mL MULTIHANCE GADOBENATE DIMEGLUMINE 529 MG/ML IV SOLN  COMPARISON:  08/09/2013  FINDINGS: Homogeneously enhancing extra-axial mass associated with the dorsal clivus and located in the prepontine cistern has not significantly changed in size, measuring 1.2 x 2.2 x 2.5 cm. Mass effect on  the ventral pons is unchanged, as is mild leftward displacement of the basilar artery.  There is no evidence of acute infarct, intracranial hemorrhage, or extra-axial fluid collection. Small, scattered foci of T2 hyperintensity within the subcortical and deep cerebral white matter bilaterally do not appear significantly changed and are compatible with mild chronic small vessel ischemic disease. Major intracranial flow voids are unremarkable. Small posterior left ethmoid air cell mucous retention cyst is unchanged.  IMPRESSION: Unchanged appearance of retroclival meningioma.   Electronically Signed   By: Logan Bores   On: 11/12/2013 11:23    Impression:  Good response to [facility] treatment.  She still has some symptoms, but radiographic improvement.  Plan:  MRI in 6 months, then follow-up.  Headache pain meds per neurology. Advised to discuss falls with neurology, as well. ---------------------------------------------------------------------------------  Eppie Gibson, MD

## 2014-11-07 ENCOUNTER — Encounter: Payer: Self-pay | Admitting: Radiation Therapy

## 2014-11-07 NOTE — Progress Notes (Unsigned)
As requested by Dr. Isidore Moos, I have forwarded her 11/04/14 Progress Note to Ms. [place] neurologist, Dr. Phillips Odor and the nurse practitioner that works at his office, [employer].   Mont Dutton

## 2015-01-10 ENCOUNTER — Other Ambulatory Visit: Payer: Self-pay | Admitting: Radiation Therapy

## 2015-01-10 DIAGNOSIS — D329 Benign neoplasm of meninges, unspecified: Secondary | ICD-10-CM

## 2015-04-08 ENCOUNTER — Other Ambulatory Visit: Payer: Self-pay | Admitting: Radiation Therapy

## 2015-04-08 DIAGNOSIS — D329 Benign neoplasm of meninges, unspecified: Secondary | ICD-10-CM

## 2015-04-15 ENCOUNTER — Other Ambulatory Visit (HOSPITAL_COMMUNITY): Payer: Self-pay | Admitting: Orthopaedic Surgery

## 2015-04-15 DIAGNOSIS — M25561 Pain in right knee: Secondary | ICD-10-CM

## 2015-04-23 ENCOUNTER — Ambulatory Visit (HOSPITAL_COMMUNITY)
Admission: RE | Admit: 2015-04-23 | Discharge: 2015-04-23 | Disposition: A | Discharge status: Home / Self Care | Payer: Medicaid Other | Source: Ambulatory Visit | Attending: Orthopaedic Surgery | Admitting: Orthopaedic Surgery

## 2015-04-23 DIAGNOSIS — M25561 Pain in right knee: Secondary | ICD-10-CM | POA: Diagnosis not present

## 2015-04-23 DIAGNOSIS — M948X6 Other specified disorders of cartilage, lower leg: Secondary | ICD-10-CM | POA: Insufficient documentation

## 2015-04-23 DIAGNOSIS — R609 Edema, unspecified: Secondary | ICD-10-CM | POA: Insufficient documentation

## 2015-04-24 ENCOUNTER — Ambulatory Visit
Admission: RE | Admit: 2015-04-24 | Discharge: 2015-04-24 | Disposition: A | Discharge status: Home / Self Care | Payer: Medicaid Other | Source: Ambulatory Visit | Attending: Radiation Oncology | Admitting: Radiation Oncology

## 2015-04-24 DIAGNOSIS — D329 Benign neoplasm of meninges, unspecified: Secondary | ICD-10-CM

## 2015-04-24 LAB — BUN AND CREATININE (CC13)
BUN: 9.5 mg/dL (ref 7.0–26.0)
Creatinine: 1 mg/dL (ref 0.6–1.1)
EGFR: 71 mL/min/{1.73_m2} — ABNORMAL LOW (ref >90)

## 2015-04-24 MED ORDER — GADOBENATE DIMEGLUMINE 529 MG/ML IV SOLN
19.0000 mL | Freq: Once | INTRAVENOUS | Status: AC | PRN
  Administered 2015-04-24: 19 mL via INTRAVENOUS

## 2015-04-28 ENCOUNTER — Ambulatory Visit
Admission: RE | Admit: 2015-04-28 | Discharge: 2015-04-28 | Disposition: A | Discharge status: Home / Self Care | Payer: Medicaid Other | Source: Ambulatory Visit | Attending: Radiation Oncology | Admitting: Radiation Oncology

## 2015-04-28 VITALS — BP 191/73 | HR 61 | Temp 98.5°F | Resp 16 | Wt 198.7 lb

## 2015-04-28 DIAGNOSIS — D329 Benign neoplasm of meninges, unspecified: Secondary | ICD-10-CM

## 2015-04-28 NOTE — Progress Notes (Signed)
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  The recent films were presented in our multidisciplinary conference with neuroradiology just prior to the clinic. Main complaint is continued HAs, and she stopped seeing her neurologist because he gave her tramadol, not oxycodone (which she reports worked better for her HAs). Inquires if I will give her an oxycodone Rx. Feels weaker on left side (chronic issue)    ALLERGIES:  is allergic to sulfa antibiotics.  Meds: Current Outpatient Prescriptions  Medication Sig Dispense Refill  . atenolol (TENORMIN) 25 MG tablet Take 25 mg by mouth daily.    . insulin glargine (LANTUS) 100 UNIT/ML injection Inject 40 Units into the skin at bedtime.     Marland Kitchen losartan-hydrochlorothiazide (HYZAAR) 100-25 MG per tablet Take 1 tablet by mouth daily.    . meloxicam (MOBIC) 15 MG tablet Take 15 mg by mouth daily.    . metFORMIN (GLUCOPHAGE) 1000 MG tablet Take 500 mg by mouth 2 (two) times daily with a meal.     . fish oil-omega-3 fatty acids 1000 MG capsule Take 1 g by mouth daily.    Marland Kitchen oxyCODONE-acetaminophen (PERCOCET) 10-325 MG per tablet Take 1 tablet by mouth every 6 (six) hours as needed for pain. Take only when headaches come on. Discuss long term headache management with neurologist. 30 tablet 0   No current facility-administered medications for this encounter.    Physical Findings:  Filed Vitals:   04/28/15 0907  BP: 191/73  Pulse: 61  Temp: 98.5 F (36.9 C)  Resp: 16   Filed Weights   04/28/15 0907  Weight: 198 lb 11.2 oz  (90.13 kg)   The patient is in no acute distress. Patient is alert and oriented. Oropharynx clear. Slight LLE weakness.  Cranial nerves grossly intact. Gait stable.  Lab Findings: Lab Results  Component Value Date   WBC 9.0 06/30/2011   HGB 10.5* 06/30/2011   HCT 31.9* 06/30/2011   MCV 84.4 06/30/2011   PLT 319 06/30/2011      Chemistry      Component Value Date/Time   NA 136 06/30/2011 0444   K 3.6 06/30/2011 0444   CL 102 06/30/2011 0444   CO2 25 06/30/2011 0444   BUN 9.5 04/24/2015 0937   BUN 12 06/30/2011 0444   CREATININE 1.0 04/24/2015 0937   CREATININE 0.72 06/30/2011 0444      Component Value Date/Time   CALCIUM 9.1 06/30/2011 0444       Radiographic Findings: Mr Jeri Cos Wo Contrast  04/24/2015   CLINICAL DATA:  Dorsal clivus meningioma.  SRS 24 month restaging.  EXAM: MRI HEAD WITHOUT AND WITH CONTRAST  TECHNIQUE: Multiplanar, multiecho pulse sequences of the brain and surrounding structures were obtained without and with intravenous contrast.  CONTRAST:  62mL MULTIHANCE GADOBENATE DIMEGLUMINE 529 MG/ML IV SOLN  COMPARISON:  10/31/2014  FINDINGS: There is new 8 mm focus of restricted diffusion in the medial right temporal lobe/subinsular region consistent with acute infarct. There is no evidence of intracranial hemorrhage, midline shift, or extra-axial fluid collection. Small foci  of T2 hyperintensity throughout the subcortical and deep cerebral white matter and pons are similar to the prior study and nonspecific but compatible with mild chronic small vessel ischemic disease. No abnormal brain parenchymal enhancement is identified. There is no hydrocephalus.  Dorsal clavicle meningioma is unchanged in size, measuring 2.4 x 2.0 x 1.3 cm. Mild impression on the right ventral pons is unchanged, and the mass is again noted to partially encircle the basilar artery, which is displaced leftward by the mass.  Orbits are unremarkable. Paranasal sinuses and mastoid air cells are  clear. Major intracranial vascular flow voids are preserved.  IMPRESSION: 1. Unchanged size of dorsal clival meningioma. Unchanged, mild mass effect on the pons. 2. 8 mm acute right temporal/subinsular infarct. These results will be called to the ordering clinician or representative by the Radiologist Assistant, and communication documented in the PACS or zVision Dashboard.   Electronically Signed   By: Logan Bores   On: 04/24/2015 12:03    Impression:  Good response to [facility] treatment.  She still has some symptoms, but radiographic stability.  BP is high today.  Plan:  MRI in 12 months, then follow-up.  Headache pain meds per neurology; since she stopped seeing current neurologist I advised her to ask the practice to assign her to another MD.   I did not give her an oxycodone Rx today.  Advised to contact PCP for BP check and possible changes in BP meds. She reports taking her BP meds faithfully.  ---------------------------------------------------------------------------------  Eppie Gibson, MD

## 2015-04-28 NOTE — Progress Notes (Signed)
She rates her pain as a 9 on a scale of 0-10. constant and aching over head right sided. Reports the oxycodone helps, stopped Tramadol. Taking ibuprofen as well.   Pt complains of fatigue .  Pt alert & oriented x 3 with fluent speech, reflexes normal and symmetric, noted gait is slow and sways, reports left sided weakness. Pt reports positive for visual blurring, eye pain, flashing lights and floaters, she is unsure which eye she is experiencing in.  Difficulty assessing pupils, pt blinking and wincing, stating the light is very bothersome. Denies auditory changes. Pt presenting appropriate quality, quantity and organization of sentences.   BP 191/73 mmHg  Pulse 61  Temp(Src) 98.5 F (36.9 C) (Oral)  Resp 16  Wt 198 lb 11.2 oz (90.13 kg)  SpO2 100%   Reports she quit seeing all her other physicians.

## 2016-01-22 ENCOUNTER — Encounter: Payer: Self-pay | Admitting: Orthopaedic Surgery

## 2016-01-22 ENCOUNTER — Ambulatory Visit (INDEPENDENT_AMBULATORY_CARE_PROVIDER_SITE_OTHER): Payer: Medicaid Other | Admitting: Orthopaedic Surgery

## 2016-01-22 VITALS — BP 169/95 | HR 72 | Temp 97.3°F | Resp 16 | Ht 63.0 in | Wt 203.0 lb

## 2016-01-22 DIAGNOSIS — M25561 Pain in right knee: Secondary | ICD-10-CM

## 2016-01-22 NOTE — Progress Notes (Addendum)
Patient [id number] Christine Harvey, female DOB:[birth year], 60 y.o. [id number]  Chief Complaint  Patient presents with  . Knee Pain    "My knee has been good for the last 3 days.  The cold weather makes it hurt worse."    HPI  Christine Harvey IS A 60 y.o. female who has chronic right knee pain.  She has good and bad days.  She has had more pain with the cooler weather. She has no giving way, no locking, no redness.  She has swelling and popping.  She is taking her medicine which helps.  She says her diabetes is well controlled and her blood pressure is as well.  Her weight has been stable.  She has no new trauma.   Knee Pain  The pain is present in the right knee. The quality of the pain is described as aching. The pain is at a severity of 3/10. The pain is mild. The pain has been fluctuating since onset. The symptoms are aggravated by weight bearing. She has tried ice, heat, acetaminophen, NSAIDs and rest for the symptoms. The treatment provided moderate relief.    Review of Systems Review of Systems  HENT: Negative for congestion.   Eyes: Negative.   Respiratory: Negative for shortness of breath.   Cardiovascular: Negative for chest pain.       Hypertension.  Endocrine:       Diabetes mellitus on insulin  Musculoskeletal: Positive for joint swelling, arthralgias and gait problem.  Neurological: Positive for headaches.       Post benign brain tumor, no residual except slight weakness of the left lower leg at times, varies.     Past Medical History  Diagnosis Date  . Hypertension   . Diabetes mellitus   . Arthritis     osteoarthritis  . Depression   . Brain tumor (benign) ([facility]) 7 years    Meningioma  along the Clivis - 2.5 x 1.2 x 1.9 cm .  some iInvolvement long the Clival bone and Effect upon the Pons  . Worsening headaches   . Photophobia     With Headaches  . Weakness of left leg   . S/P radiation therapy 05/02/13    25 Gy at 5 Gy per fraction, last treatment on 04/30/13     Past Surgical History  Procedure Laterality Date  . Tonsillectomy      As a child    No family history on file.  Social History Social History  Substance Use Topics  . Smoking status: Current Some Day Smoker  . Smokeless tobacco: None  . Alcohol Use: Yes     Comment: 2010 -  Alcohol Rehabilitation/ Drinks Occassionally    Allergies  Allergen Reactions  . Sulfa Antibiotics Other (See Comments)    Passes out    Current Outpatient Prescriptions  Medication Sig Dispense Refill  . atenolol (TENORMIN) 25 MG tablet Take 50 mg by mouth daily.     . insulin glargine (LANTUS) 100 UNIT/ML injection Inject 40 Units into the skin at bedtime.     Marland Kitchen lisinopril (PRINIVIL,ZESTRIL) 40 MG tablet Take 40 mg by mouth daily.    Marland Kitchen losartan-hydrochlorothiazide (HYZAAR) 100-25 MG per tablet Take 1 tablet by mouth daily.    . metFORMIN (GLUCOPHAGE) 1000 MG tablet Take 500 mg by mouth 2 (two) times daily with a meal.     . potassium chloride (K-DUR) 10 MEQ tablet Take 10 mEq by mouth daily.     No current facility-administered medications for  this visit.      Physical Exam Blood pressure 169/95, pulse 72, temperature 97.3 F (36.3 C), resp. rate 16, height 5\' 3"  (1.6 m), weight 203 lb (92.08 kg). General appearance: overall normal hygiene, normal nutrition, well developed, normal grooming, normal body habitus. Assistive device:none Musculoskeletal: gait and station shows a slight limp to the right; overall no swelling, no varicosities, no edema bilaterally, normal temperatures of the legs and arms, no clubbing, cyanosis, edema and good capillary refill. Neurological: coordination overall normal.  Deep tendon reflex/nerve stretch intact.  Sensation normal.  Cranial nerves II-XII intact. Skin:overall no scars, lesions, ulcers or rashes. Psychiatric: Alert and oriented x 3.  Recent memory intact, remote memory unclear.  Normal mood and affect.  The right knee has slight effusion, no redness.   ROM is 0 to 105 with crepitus.  The knee is stable and has normal strength and tone.    PLAN Continue current medicines.  Continue to observe blood sugars.  Continue activity levels.  Call if any problem.  Precautions on Naprosyn given again.

## 2016-01-22 NOTE — Patient Instructions (Signed)
Call if any problems, precautions discussed, continue current medications 

## 2016-01-23 DIAGNOSIS — M25561 Pain in right knee: Secondary | ICD-10-CM | POA: Insufficient documentation

## 2016-01-27 LAB — HEMOGLOBIN A1C: Hemoglobin A1C: 10.4

## 2016-02-09 ENCOUNTER — Telehealth: Payer: Self-pay | Admitting: Orthopaedic Surgery

## 2016-02-09 NOTE — Telephone Encounter (Signed)
Patient requests refill on Norco 7.5-325 mgs.  Qty 120  This was last filled on 01-12-16

## 2016-02-10 MED ORDER — HYDROCODONE-ACETAMINOPHEN 7.5-325 MG PO TABS: 1.0000 | ORAL_TABLET | ORAL | Status: DC | PRN

## 2016-02-10 NOTE — Telephone Encounter (Signed)
Rx printed

## 2016-02-10 NOTE — Telephone Encounter (Signed)
Prescription available, patient aware  

## 2016-02-18 ENCOUNTER — Encounter: Payer: Self-pay | Admitting: "Endocrinology

## 2016-02-18 ENCOUNTER — Encounter: Payer: Medicaid Other | Attending: Family Medicine | Admitting: Nutrition

## 2016-02-18 ENCOUNTER — Encounter: Payer: Self-pay | Admitting: Nutrition

## 2016-02-18 ENCOUNTER — Ambulatory Visit (INDEPENDENT_AMBULATORY_CARE_PROVIDER_SITE_OTHER): Payer: Medicaid Other | Admitting: "Endocrinology

## 2016-02-18 VITALS — BP 130/82 | HR 72 | Ht 63.0 in | Wt 199.0 lb

## 2016-02-18 VITALS — Ht 63.0 in | Wt 199.0 lb

## 2016-02-18 DIAGNOSIS — N183 Chronic kidney disease, stage 3 (moderate): Secondary | ICD-10-CM | POA: Diagnosis present

## 2016-02-18 DIAGNOSIS — Z794 Long term (current) use of insulin: Secondary | ICD-10-CM

## 2016-02-18 DIAGNOSIS — E669 Obesity, unspecified: Secondary | ICD-10-CM

## 2016-02-18 DIAGNOSIS — E785 Hyperlipidemia, unspecified: Secondary | ICD-10-CM | POA: Diagnosis not present

## 2016-02-18 DIAGNOSIS — E1159 Type 2 diabetes mellitus with other circulatory complications: Secondary | ICD-10-CM | POA: Insufficient documentation

## 2016-02-18 DIAGNOSIS — E1122 Type 2 diabetes mellitus with diabetic chronic kidney disease: Secondary | ICD-10-CM

## 2016-02-18 DIAGNOSIS — I10 Essential (primary) hypertension: Secondary | ICD-10-CM | POA: Insufficient documentation

## 2016-02-18 DIAGNOSIS — E1165 Type 2 diabetes mellitus with hyperglycemia: Secondary | ICD-10-CM | POA: Insufficient documentation

## 2016-02-18 DIAGNOSIS — IMO0002 Reserved for concepts with insufficient information to code with codable children: Secondary | ICD-10-CM

## 2016-02-18 DIAGNOSIS — E118 Type 2 diabetes mellitus with unspecified complications: Secondary | ICD-10-CM

## 2016-02-18 MED ORDER — GLUCOSE BLOOD VI STRP: ORAL_STRIP | Status: DC

## 2016-02-18 MED ORDER — ACCU-CHEK AVIVA DEVI: Status: AC

## 2016-02-18 NOTE — Patient Instructions (Signed)

## 2016-02-18 NOTE — Progress Notes (Signed)
  Medical Nutrition Therapy:  Appt start time: 1430 end time:  1500.   Assessment:  Primary concerns today: Diabetes. Type 2 . Has had it for 5 years. LIves by herself. Her sister is here with today. Most recent A1C was 10.4%. Currently on 50 units of Lantus daily. PMH: CKD, HTN.  Admits to being hungry, craving sweets, tired/fatigue, no energy and slightly depressed. Not able to answer safetey questions due to walk in visit from Dr. Liliane Channel office. Wants to get her BS down and feel better.   Lab Results  Component Value Date   HGBA1C 7.6* 06/29/2011   Preferred Learning Style:  Auditory  Visual  Hands on  No preference indicated   Learning Readiness:   Change in progress   MEDICATIONS: See list   DIETARY INTAKE:  Eats 2-3 meals per day. Loves and craves sweets. Drinks diet sodas.   Usual physical activity: ADL  Estimated energy needs: 1500 calories 170 g carbohydrates 112 g protein 42 g fat  Progress Towards Goal(s):  In progress.   Nutritional Diagnosis:  NB-1.1 Food and nutrition-related knowledge deficit As related to Diabetes.  As evidenced by A1C 10.4%..    Intervention:  Nutrition and Diabetes education provided on My Plate, CHO counting, meal planning, portion sizes, timing of meals, avoiding snacks between meals unless having a low blood sugar, target ranges for A1C and blood sugars, signs/symptoms and treatment of hyper/hypoglycemia, monitoring blood sugars, taking medications as prescribed, benefits of exercising 30 minutes per day and prevention of complications of DM.  Goals: 1 Follow My Plate Method 2. Increase fresh fruits and vegetables. 3. Cut out snacks between meals. 4. Give 50 units of Lantus at night. 5. Do not skip meals. 6. Drink only water. 7. Test bloods sugars per Dr. Liliane Channel orders; 4 times per day. 8. Keep food journal for 1 week. 9. Bring meter and BS log to all visits.   Teaching Method Utilized:  Visual Auditory Hands  on  Handouts given during visit include:  The Plate Method  Meal Plan Card  Diabetes Instructions  Barriers to learning/adherence to lifestyle change: none  Demonstrated degree of understanding via:  Teach Back   Monitoring/Evaluation:  Dietary intake, exercise, meal planning, SBG, and body weight in 1 week(s).

## 2016-02-18 NOTE — Patient Instructions (Signed)
Goals: 1 Follow My Plate Method 2. Increase fresh fruits and vegetables. 3. Cut out snacks between meals. 4. Give 50 units of Lantus at night. 5. Do not skip meals. 6. Drink only water. 7. Test bloods sugars per Dr. Liliane Channel orders; 4 times per day. 8. Keep food journal for 1 week. 9. Bring meter and BS log to all visits.

## 2016-02-18 NOTE — Progress Notes (Signed)
Subjective:    Patient ID: Christine Harvey, female    DOB: 08/28/1956. Patient is being seen in consultation for management of diabetes requested by  Celedonio Savage, MD  Past Medical History  Diagnosis Date  . Hypertension   . Diabetes mellitus   . Arthritis     osteoarthritis  . Depression   . Brain tumor (benign) ([place]) 7 years    Meningioma  along the [place] - 2.5 x 1.2 x 1.9 cm .  some iInvolvement long the Clival bone and Effect upon the Pons  . Worsening headaches   . Photophobia     With Headaches  . Weakness of left leg   . S/P radiation therapy 05/02/13    25 Gy at 5 Gy per fraction, last treatment on 04/30/13   Past Surgical History  Procedure Laterality Date  . Tonsillectomy      As a child   Social History   Social History  . Marital Status: Single    Spouse Name: N/A  . Number of Children: N/A  . Years of Education: N/A   Social History Main Topics  . Smoking status: Current Some Day Smoker  . Smokeless tobacco: None  . Alcohol Use: Yes     Comment: 2010 -  Alcohol Rehabilitation/ Drinks Occassionally  . Drug Use: No  . Sexual Activity: Not Asked   Other Topics Concern  . None   Social History Narrative   Outpatient Encounter Prescriptions as of 02/18/2016  Medication Sig  . acyclovir (ZOVIRAX) 200 MG capsule Take 200 mg by mouth as needed.  Marland Kitchen atenolol (TENORMIN) 25 MG tablet Take 50 mg by mouth daily.   Marland Kitchen HYDROcodone-acetaminophen (NORCO) 7.5-325 MG tablet Take 1 tablet by mouth every 4 (four) hours as needed for moderate pain (Must last 30 days.  Do not drive or operate machinery while taking this medicine.).  . Insulin Glargine (LANTUS SOLOSTAR) 100 UNIT/ML Solostar Pen Inject 50 Units into the skin at bedtime.  Marland Kitchen lisinopril (PRINIVIL,ZESTRIL) 40 MG tablet Take 40 mg by mouth daily.  Marland Kitchen losartan-hydrochlorothiazide (HYZAAR) 100-25 MG per tablet Take 1 tablet by mouth daily.  . naproxen (NAPROSYN) 500 MG tablet Take 500 mg by mouth 2 (two) times daily  with a meal.  . potassium chloride (K-DUR) 10 MEQ tablet Take 10 mEq by mouth daily.  . [DISCONTINUED] insulin glargine (LANTUS) 100 UNIT/ML injection Inject 40 Units into the skin at bedtime.   . [DISCONTINUED] metFORMIN (GLUCOPHAGE) 1000 MG tablet Take 1,000 mg by mouth daily.   . Blood Glucose Monitoring Suppl (ACCU-CHEK AVIVA) device Use as instructed  . glucose blood (ACCU-CHEK AVIVA) test strip Test glucose 4 times a day   No facility-administered encounter medications on file as of 02/18/2016.   ALLERGIES: Allergies  Allergen Reactions  . Sulfa Antibiotics Other (See Comments)    Passes out   VACCINATION STATUS:  There is no immunization history on file for this patient.  Diabetes Christine Harvey presents for her initial diabetic visit. Christine Harvey has type 2 diabetes mellitus. Onset time: Christine Harvey was diagnosed at approximately 60 years of age. Her disease course has been worsening. There are no hypoglycemic associated symptoms. Pertinent negatives for hypoglycemia include no confusion, headaches, pallor or seizures. Associated symptoms include fatigue, polydipsia and polyuria. Pertinent negatives for diabetes include no chest pain and no polyphagia. There are no hypoglycemic complications. Symptoms are worsening. Diabetic complications include nephropathy. Risk factors for coronary artery disease include dyslipidemia, diabetes mellitus, obesity, hypertension, tobacco exposure,  sedentary lifestyle and family history. Current diabetic treatments: Christine Harvey is on Lantus 40 units at bedtime and metformin 500 mg by mouth twice a day. Christine Harvey is following a generally unhealthy diet. When asked about meal planning, Christine Harvey reported none. Christine Harvey has not had a previous visit with a dietitian (Christine Harvey will see the dietitian today.). Christine Harvey never participates in exercise. Home blood sugar record trend: Christine Harvey did not bring any meter nor logs of blood glucose. Christine Harvey admits not to monitoring blood glucose regularly. An ACE inhibitor/angiotensin II  receptor blocker is being taken.  Hypertension This is a chronic problem. The current episode started more than 1 year ago. Pertinent negatives include no chest pain, headaches, palpitations or shortness of breath. Risk factors for coronary artery disease include smoking/tobacco exposure, diabetes mellitus, dyslipidemia, obesity and sedentary lifestyle. Past treatments include angiotensin blockers.       Review of Systems  Constitutional: Positive for fatigue. Negative for fever, chills and unexpected weight change.  HENT: Negative for trouble swallowing and voice change.   Eyes: Negative for visual disturbance.  Respiratory: Negative for cough, shortness of breath and wheezing.   Cardiovascular: Negative for chest pain, palpitations and leg swelling.  Gastrointestinal: Negative for nausea, vomiting and diarrhea.  Endocrine: Positive for polydipsia and polyuria. Negative for cold intolerance, heat intolerance and polyphagia.  Musculoskeletal: Negative for myalgias and arthralgias.  Skin: Negative for color change, pallor, rash and wound.  Neurological: Negative for seizures and headaches.  Psychiatric/Behavioral: Negative for suicidal ideas and confusion.    Objective:    BP 130/82 mmHg  Pulse 72  Ht 5\' 3"  (1.6 m)  Wt 199 lb (90.266 kg)  BMI 35.26 kg/m2  SpO2 98%  Wt Readings from Last 3 Encounters:  02/18/16 199 lb (90.266 kg)  02/18/16 199 lb (90.266 kg)  01/22/16 203 lb (92.08 kg)    Physical Exam  Constitutional: Christine Harvey is oriented to person, place, and time. Christine Harvey appears well-developed.  HENT:  Head: Normocephalic and atraumatic.  Eyes: EOM are normal.  Neck: Normal range of motion. Neck supple. No tracheal deviation present. No thyromegaly present.  Cardiovascular: Normal rate and regular rhythm.   Pulmonary/Chest: Effort normal and breath sounds normal.  Abdominal: Soft. Bowel sounds are normal. There is no tenderness. There is no guarding.  Musculoskeletal: Normal  range of motion. Christine Harvey exhibits no edema.  Neurological: Christine Harvey is alert and oriented to person, place, and time. Christine Harvey has normal reflexes. No cranial nerve deficit. Coordination normal.  Skin: Skin is warm and dry. No rash noted. No erythema. No pallor.  Psychiatric: Christine Harvey has a normal mood and affect. Judgment normal.     CMP ( most recent) CMP     Component Value Date/Time   NA 136 06/30/2011 0444   K 3.6 06/30/2011 0444   CL 102 06/30/2011 0444   CO2 25 06/30/2011 0444   GLUCOSE 143* 06/30/2011 0444   BUN 9.5 04/24/2015 0937   BUN 12 06/30/2011 0444   CREATININE 1.0 04/24/2015 0937   CREATININE 0.72 06/30/2011 0444   CALCIUM 9.1 06/30/2011 0444   GFRNONAA >60 06/30/2011 0444   GFRAA >60 06/30/2011 0444     Diabetic Labs (most recent): Lab Results  Component Value Date   HGBA1C 10.4 01/27/2016   HGBA1C 7.6* 06/29/2011     Lipid Panel ( most recent) Lipid Panel     Component Value Date/Time   CHOL 102 06/30/2011 0444   TRIG 96 06/30/2011 0444   HDL 31* 06/30/2011 0444   CHOLHDL  3.3 06/30/2011 0444   VLDL 19 06/30/2011 0444   LDLCALC 52 06/30/2011 0444            Assessment & Plan:   1. Uncontrolled type 2 diabetes mellitus with stage 3 chronic kidney disease, with long-term current use of insulin ([place])   - Patient has currently uncontrolled symptomatic type 2 DM since  60 years of age,  with most recent A1c of 10.4 %. Recent labs reviewed.   Her diabetes is complicated by stage 3 CKD and patient remains at a high risk for more acute and chronic complications of diabetes which include CAD, CVA, CKD, retinopathy, and neuropathy. These are all discussed in detail with the patient.  - I have counseled the patient on diet management and weight loss, by adopting a carbohydrate restricted/protein rich diet.  - Suggestion is made for patient to avoid simple carbohydrates   from their diet including Cakes , Desserts, Ice Cream,  Soda (  diet and regular) , Sweet Tea ,  Candies,  Chips, Cookies, Artificial Sweeteners,   and "Sugar-free" Products . This will help patient to have stable blood glucose profile and potentially avoid unintended weight gain.  - I encouraged the patient to switch to  unprocessed or minimally processed complex starch and increased protein intake (animal or plant source), fruits, and vegetables.  - Patient is advised to stick to a routine mealtimes to eat 3 meals  a day and avoid unnecessary snacks ( to snack only to correct hypoglycemia).  - The patient will be scheduled with Jearld Fenton, RDN, CDE for individualized DM education.  - I have approached patient with the following individualized plan to manage diabetes and patient agrees:   - I  will proceed to  readjust her basal insulin Lantus to 50  units QHS,  associated with strict monitoring of glucose  AC and HS. -Based on her commitment and blood glucose readings, Christine Harvey may require prandial insulin when Christine Harvey returns in one week.  - Patient is warned not to take insulin without proper monitoring per orders. -Adjustment parameters are given for hypo and hyperglycemia in writing. -Patient is encouraged to call clinic for blood glucose levels less than 70 or above 300 mg /dl. - I will discontinue metformin due to CK D.  -Patient is not a candidate for SGLT2 inhibitors due to CKD.  - Patient will be considered for incretin therapy as appropriate next visit. - Patient specific target  A1c;  LDL, HDL, Triglycerides, and  Waist Circumference were discussed in detail.  2) BP/HTN:  controlled Continue current medications including ACEI/ARB. 3) Lipids/HPL:  controlled LDL at 57. Patient is not on statins , will consider on subsequent visits. 4)  Weight/Diet: CDE Consult will be initiated , exercise, and detailed carbohydrates information provided.  5) Chronic Care/Health Maintenance:  -Patient is on ACEI/ARB  medications and encouraged to continue to follow up with Ophthalmology,  Podiatrist at least yearly or according to recommendations, and advised to   quit smoking. I have recommended yearly flu vaccine and pneumonia vaccination at least every 5 years; moderate intensity exercise for up to 150 minutes weekly; and  sleep for at least 7 hours a day.  - 60 minutes of time was spent on the care of this patient , 50% of which was applied for counseling on diabetes complications and their preventions.  - Patient to bring meter and  blood glucose logs during their next visit.  -Extensive smoking cessation advice and counseling given to her. -  I advised patient to maintain close follow up with Celedonio Savage, MD for primary care needs.  Follow up plan: - Return in about 1 week (around 02/25/2016) for diabetes, high blood pressure, high cholesterol, follow up with meter and logs- no labs.  Glade Lloyd, MD Phone: [phone]  Fax: [phone]   02/18/2016, 4:20 PM

## 2016-02-19 ENCOUNTER — Other Ambulatory Visit: Payer: Self-pay

## 2016-02-19 MED ORDER — GLUCOSE BLOOD VI STRP: ORAL_STRIP | Status: AC

## 2016-02-26 ENCOUNTER — Ambulatory Visit (INDEPENDENT_AMBULATORY_CARE_PROVIDER_SITE_OTHER): Payer: Medicaid Other | Admitting: "Endocrinology

## 2016-02-26 ENCOUNTER — Encounter: Payer: Self-pay | Admitting: "Endocrinology

## 2016-02-26 ENCOUNTER — Encounter: Payer: Medicaid Other | Admitting: Nutrition

## 2016-02-26 VITALS — BP 136/83 | HR 78 | Ht 63.0 in | Wt 198.0 lb

## 2016-02-26 VITALS — Ht 63.0 in | Wt 198.0 lb

## 2016-02-26 DIAGNOSIS — I10 Essential (primary) hypertension: Secondary | ICD-10-CM | POA: Diagnosis not present

## 2016-02-26 DIAGNOSIS — E785 Hyperlipidemia, unspecified: Secondary | ICD-10-CM | POA: Diagnosis not present

## 2016-02-26 DIAGNOSIS — E1122 Type 2 diabetes mellitus with diabetic chronic kidney disease: Secondary | ICD-10-CM | POA: Diagnosis not present

## 2016-02-26 DIAGNOSIS — IMO0002 Reserved for concepts with insufficient information to code with codable children: Secondary | ICD-10-CM

## 2016-02-26 DIAGNOSIS — Z794 Long term (current) use of insulin: Secondary | ICD-10-CM

## 2016-02-26 DIAGNOSIS — E1165 Type 2 diabetes mellitus with hyperglycemia: Secondary | ICD-10-CM

## 2016-02-26 DIAGNOSIS — E118 Type 2 diabetes mellitus with unspecified complications: Principal | ICD-10-CM

## 2016-02-26 DIAGNOSIS — N183 Chronic kidney disease, stage 3 (moderate): Secondary | ICD-10-CM | POA: Diagnosis not present

## 2016-02-26 NOTE — Progress Notes (Signed)
Subjective:    Patient ID: Christine Harvey, female    DOB: 1956/08/28. Patient is being seen in consultation for management of diabetes requested by  Celedonio Savage, MD  Past Medical History  Diagnosis Date  . Hypertension   . Diabetes mellitus   . Arthritis     osteoarthritis  . Depression   . Brain tumor (benign) ([place]) 7 years    Meningioma  along the [place] - 2.5 x 1.2 x 1.9 cm .  some iInvolvement long the Clival bone and Effect upon the Pons  . Worsening headaches   . Photophobia     With Headaches  . Weakness of left leg   . S/P radiation therapy 05/02/13    25 Gy at 5 Gy per fraction, last treatment on 04/30/13   Past Surgical History  Procedure Laterality Date  . Tonsillectomy      As a child   Social History   Social History  . Marital Status: Single    Spouse Name: N/A  . Number of Children: N/A  . Years of Education: N/A   Social History Main Topics  . Smoking status: Current Some Day Smoker  . Smokeless tobacco: None  . Alcohol Use: Yes     Comment: 2010 -  Alcohol Rehabilitation/ Drinks Occassionally  . Drug Use: No  . Sexual Activity: Not Asked   Other Topics Concern  . None   Social History Narrative   Outpatient Encounter Prescriptions as of 02/26/2016  Medication Sig  . acyclovir (ZOVIRAX) 200 MG capsule Take 200 mg by mouth as needed.  Marland Kitchen atenolol (TENORMIN) 25 MG tablet Take 50 mg by mouth daily.   . Blood Glucose Monitoring Suppl (ACCU-CHEK AVIVA) device Use as instructed  . glucose blood (ACCU-CHEK AVIVA) test strip Test glucose 4 times a day E11.65  . HYDROcodone-acetaminophen (NORCO) 7.5-325 MG tablet Take 1 tablet by mouth every 4 (four) hours as needed for moderate pain (Must last 30 days.  Do not drive or operate machinery while taking this medicine.).  . Insulin Glargine (LANTUS SOLOSTAR) 100 UNIT/ML Solostar Pen Inject 50 Units into the skin at bedtime.  Marland Kitchen lisinopril (PRINIVIL,ZESTRIL) 40 MG tablet Take 40 mg by mouth daily.  Marland Kitchen  losartan-hydrochlorothiazide (HYZAAR) 100-25 MG per tablet Take 1 tablet by mouth daily.  . naproxen (NAPROSYN) 500 MG tablet Take 500 mg by mouth 2 (two) times daily with a meal.  . potassium chloride (K-DUR) 10 MEQ tablet Take 10 mEq by mouth daily.   No facility-administered encounter medications on file as of 02/26/2016.   ALLERGIES: Allergies  Allergen Reactions  . Sulfa Antibiotics Other (See Comments)    Passes out   VACCINATION STATUS:  There is no immunization history on file for this patient.  Diabetes She presents for her follow-up diabetic visit. She has type 2 diabetes mellitus. Onset time: She was diagnosed at approximately 60 years of age. Her disease course has been worsening. There are no hypoglycemic associated symptoms. Pertinent negatives for hypoglycemia include no confusion, headaches, pallor or seizures. Associated symptoms include fatigue, polydipsia and polyuria. Pertinent negatives for diabetes include no chest pain and no polyphagia. There are no hypoglycemic complications. Symptoms are worsening. Diabetic complications include nephropathy. Risk factors for coronary artery disease include dyslipidemia, diabetes mellitus, obesity, hypertension, tobacco exposure, sedentary lifestyle and family history. Current diabetic treatments: She is on Lantus 40 units at bedtime and metformin 500 mg by mouth twice a day. She is following a generally unhealthy diet.  When asked about meal planning, she reported none. She has not had a previous visit with a dietitian (She will see the dietitian today.). She never participates in exercise. Home blood sugar record trend: She came with inadequate monitoring, average blood glucose is still high at 291. But she monitored only once a day. Her overall blood glucose range is >200 mg/dl. An ACE inhibitor/angiotensin II receptor blocker is being taken.  Hypertension This is a chronic problem. The current episode started more than 1 year ago.  Pertinent negatives include no chest pain, headaches, palpitations or shortness of breath. Risk factors for coronary artery disease include smoking/tobacco exposure, diabetes mellitus, dyslipidemia, obesity and sedentary lifestyle. Past treatments include angiotensin blockers.       Review of Systems  Constitutional: Positive for fatigue. Negative for fever, chills and unexpected weight change.  HENT: Negative for trouble swallowing and voice change.   Eyes: Negative for visual disturbance.  Respiratory: Negative for cough, shortness of breath and wheezing.   Cardiovascular: Negative for chest pain, palpitations and leg swelling.  Gastrointestinal: Negative for nausea, vomiting and diarrhea.  Endocrine: Positive for polydipsia and polyuria. Negative for cold intolerance, heat intolerance and polyphagia.  Musculoskeletal: Negative for myalgias and arthralgias.  Skin: Negative for color change, pallor, rash and wound.  Neurological: Negative for seizures and headaches.  Psychiatric/Behavioral: Negative for suicidal ideas and confusion.    Objective:    BP 136/83 mmHg  Pulse 78  Ht 5\' 3"  (1.6 m)  Wt 198 lb (89.812 kg)  BMI 35.08 kg/m2  SpO2 95%  Wt Readings from Last 3 Encounters:  02/26/16 198 lb (89.812 kg)  02/26/16 198 lb (89.812 kg)  02/18/16 199 lb (90.266 kg)    Physical Exam  Constitutional: She is oriented to person, place, and time. She appears well-developed.  HENT:  Head: Normocephalic and atraumatic.  Eyes: EOM are normal.  Neck: Normal range of motion. Neck supple. No tracheal deviation present. No thyromegaly present.  Cardiovascular: Normal rate and regular rhythm.   Pulmonary/Chest: Effort normal and breath sounds normal.  Abdominal: Soft. Bowel sounds are normal. There is no tenderness. There is no guarding.  Musculoskeletal: Normal range of motion. She exhibits no edema.  Neurological: She is alert and oriented to person, place, and time. She has normal  reflexes. No cranial nerve deficit. Coordination normal.  Skin: Skin is warm and dry. No rash noted. No erythema. No pallor.  Psychiatric: She has a normal mood and affect. Judgment normal.     CMP ( most recent) CMP     Component Value Date/Time   NA 136 06/30/2011 0444   K 3.6 06/30/2011 0444   CL 102 06/30/2011 0444   CO2 25 06/30/2011 0444   GLUCOSE 143* 06/30/2011 0444   BUN 9.5 04/24/2015 0937   BUN 12 06/30/2011 0444   CREATININE 1.0 04/24/2015 0937   CREATININE 0.72 06/30/2011 0444   CALCIUM 9.1 06/30/2011 0444   GFRNONAA >60 06/30/2011 0444   GFRAA >60 06/30/2011 0444     Diabetic Labs (most recent): Lab Results  Component Value Date   HGBA1C 10.4 01/27/2016   HGBA1C 7.6* 06/29/2011     Lipid Panel ( most recent) Lipid Panel     Component Value Date/Time   CHOL 102 06/30/2011 0444   TRIG 96 06/30/2011 0444   HDL 31* 06/30/2011 0444   CHOLHDL 3.3 06/30/2011 0444   VLDL 19 06/30/2011 0444   LDLCALC 52 06/30/2011 0444     Assessment & Plan:  1. Uncontrolled type 2 diabetes mellitus with stage 3 chronic kidney disease, with long-term current use of insulin ([place])   - Patient has currently uncontrolled symptomatic type 2 DM since  60 years of age,  with most recent A1c of 10.4 %. Recent labs reviewed.   Her diabetes is complicated by stage 3 CKD and patient remains at a high risk for more acute and chronic complications of diabetes which include CAD, CVA, CKD, retinopathy, and neuropathy. These are all discussed in detail with the patient.  - I have counseled the patient on diet management and weight loss, by adopting a carbohydrate restricted/protein rich diet.  - Suggestion is made for patient to avoid simple carbohydrates   from their diet including Cakes , Desserts, Ice Cream,  Soda (  diet and regular) , Sweet Tea , Candies,  Chips, Cookies, Artificial Sweeteners,   and "Sugar-free" Products . This will help patient to have stable blood glucose profile  and potentially avoid unintended weight gain.  - I encouraged the patient to switch to  unprocessed or minimally processed complex starch and increased protein intake (animal or plant source), fruits, and vegetables.  - Patient is advised to stick to a routine mealtimes to eat 3 meals  a day and avoid unnecessary snacks ( to snack only to correct hypoglycemia).  - The patient will be scheduled with Jearld Fenton, RDN, CDE for individualized DM education.  - I have approached patient with the following individualized plan to manage diabetes and patient agrees:   - She came with inadequate monitoring, not enough to make a decision to put her on prandial insulin. - I  will proceed with basal insulin Lantus  50  units QHS,  associated with strict monitoring of glucose  AC and HS. -Based on her commitment and blood glucose readings, she may require prandial insulin when she returns in one week.  - Patient is warned not to take insulin without proper monitoring per orders. -Adjustment parameters are given for hypo and hyperglycemia in writing. -Patient is encouraged to call clinic for blood glucose levels less than 70 or above 300 mg /dl.   -Patient is not a candidate for SGLT2 inhibitors due to CKD.  - Patient will be considered for incretin therapy as appropriate next visit. - Patient specific target  A1c;  LDL, HDL, Triglycerides, and  Waist Circumference were discussed in detail.  2) BP/HTN:  controlled Continue current medications including ACEI/ARB. 3) Lipids/HPL:  controlled LDL at 57. Patient is not on statins , will consider on subsequent visits. 4)  Weight/Diet: CDE Consult will be initiated , exercise, and detailed carbohydrates information provided.  5) Chronic Care/Health Maintenance:  -Patient is on ACEI/ARB  medications and encouraged to continue to follow up with Ophthalmology, Podiatrist at least yearly or according to recommendations, and advised to   quit smoking. I have  recommended yearly flu vaccine and pneumonia vaccination at least every 5 years; moderate intensity exercise for up to 150 minutes weekly; and  sleep for at least 7 hours a day.  - 25 minutes of time was spent on the care of this patient , 50% of which was applied for counseling on diabetes complications and their preventions.  - Patient to bring meter and  blood glucose logs during their next visit.  -Extensive smoking cessation advice and counseling given to her. - I advised patient to maintain close follow up with Celedonio Savage, MD for primary care needs.  Follow up plan: - Return in  about 1 week (around 03/04/2016) for diabetes, high blood pressure, high cholesterol, follow up with meter and logs- no labs.  Glade Lloyd, MD Phone: [phone]  Fax: [phone]   02/26/2016, 5:20 PM

## 2016-02-26 NOTE — Patient Instructions (Signed)
Advice for weight management -For most of us the best way to lose weight is by diet management. Generally speaking, diet management means restricting carbohydrate consumption to minimum possible (and to unprocessed or minimally processed complex starch) and increasing protein intake (animal or plant source), fruits, and vegetables.  -Sticking to a routine mealtime to eat 3 meals a day and avoiding unnecessary snacks is shown to have a big role in weight control.  -It is better to avoid simple carbohydrates including: Cakes, Desserts, Ice Cream, Soda (diet and regular), Sweet Tea, Candies, Chips, Cookies, Artificial Sweeteners, and "Sugar-free" Products.   -Exercise: 30 minutes a day 3-4 days a week, or 150 minutes a week. Combine stretch, strength, and aerobic activities. You may seek evaluation by your heart doctor prior to initiating exercise if you have high risk for heart disease.  -If you are interested, we can schedule a visit with Christine Harvey, RDN, CDE for individualized nutrition education.  

## 2016-02-26 NOTE — Patient Instructions (Signed)
Goals: 1 Follow My Plate Method 2. Increase fresh fruits and vegetables. 3. Cut out snacks between meals. 4. Give 50 units of Lantus at night. 5. Do not skip meals. 6. Drink only water. 7. Test bloods sugars per Dr. Liliane Channel orders; 4 times per day. 8. Keep food journal for 1 week. 9. Bring meter and BS log to all visits

## 2016-02-26 NOTE — Progress Notes (Signed)
  Medical Nutrition Therapy:  Appt start time: 1430 end time:  1500.  Assessment:  Primary concerns today: Diabetes. Type 2 Follow up DM. She notes she has been trying to eat better. Here to see Dr. Dorris Fetch. Didn't bring log book or meter because she just got her strips on Saturday. She is taking 50 units of Lantus daily. Drinking more water.  Marland Kitchen PMH: CKD, HTN.  Admits to being hungry, craving sweets, tired/fatigue, no energy and slightly depressed.   Lab Results  Component Value Date   HGBA1C 10.4 01/27/2016   Preferred Learning Style:  Auditory  Visual  Hands on  No preference indicated   Learning Readiness:   Change in progress   MEDICATIONS: See list   DIETARY INTAKE:  Breakfast: 1 egg and 1 slice toast  Lunch: hasn't eatn lunch yet. Eats 2-3 meals per day.  Drinking water some and milk   Usual physical activity: ADL  Estimated energy needs: 1500 calories 170 g carbohydrates 112 g protein 42 g fat  Progress Towards Goal(s):  In progress.   Nutritional Diagnosis:  NB-1.1 Food and nutrition-related knowledge deficit As related to Diabetes.  As evidenced by A1C 10.4%..    Intervention:  Nutrition and Diabetes education provided on My Plate, CHO counting, meal planning, portion sizes, timing of meals, avoiding snacks between meals unless having a low blood sugar, target ranges for A1C and blood sugars, signs/symptoms and treatment of hyper/hypoglycemia, monitoring blood sugars, taking medications as prescribed, benefits of exercising 30 minutes per day and prevention of complications of DM.  Goals: 1 Follow My Plate Method 2. Increase fresh fruits and vegetables. 3. Cut out snacks between meals. 4. Give 50 units of Lantus at night. 5. Do not skip meals. 6. Drink only water. 7. Test bloods sugars per Dr. Liliane Channel orders; 4 times per day. 8. Keep food journal for 1 week. 9. Bring meter and BS log to all visits.   Teaching Method Utilized:  Visual Auditory Hands  on  Handouts given during visit include:  The Plate Method  Meal Plan Card  Diabetes Instructions  Barriers to learning/adherence to lifestyle change: none  Demonstrated degree of understanding via:  Teach Back   Monitoring/Evaluation:  Dietary intake, exercise, meal planning, SBG, and body weight in 2 week(s).

## 2016-02-28 NOTE — Addendum Note (Signed)
Addended by: Willette Pa on: 02/28/2016 09:00 PM   Modules accepted: Miquel Dunn

## 2016-03-01 ENCOUNTER — Ambulatory Visit: Payer: Medicaid Other | Admitting: Nutrition

## 2016-03-08 ENCOUNTER — Telehealth: Payer: Self-pay | Admitting: Orthopaedic Surgery

## 2016-03-08 MED ORDER — HYDROCODONE-ACETAMINOPHEN 7.5-325 MG PO TABS: 1.0000 | ORAL_TABLET | ORAL | Status: DC | PRN

## 2016-03-08 NOTE — Telephone Encounter (Signed)
Rx Done . 

## 2016-03-08 NOTE — Telephone Encounter (Signed)
Patient called to request refill on medication: HYDROcodone-acetaminophen (NORCO) 7.5-325 MG tablet [id number] - quantity 120.  States it is due tomorrow, 03/09/16. Patient ph# [phone].

## 2016-03-09 ENCOUNTER — Encounter: Payer: Self-pay | Admitting: "Endocrinology

## 2016-03-09 ENCOUNTER — Ambulatory Visit (INDEPENDENT_AMBULATORY_CARE_PROVIDER_SITE_OTHER): Payer: Medicaid Other | Admitting: "Endocrinology

## 2016-03-09 VITALS — BP 144/85 | HR 60 | Ht 63.0 in | Wt 200.0 lb

## 2016-03-09 DIAGNOSIS — E1122 Type 2 diabetes mellitus with diabetic chronic kidney disease: Secondary | ICD-10-CM

## 2016-03-09 DIAGNOSIS — E785 Hyperlipidemia, unspecified: Secondary | ICD-10-CM | POA: Diagnosis not present

## 2016-03-09 DIAGNOSIS — I10 Essential (primary) hypertension: Secondary | ICD-10-CM | POA: Diagnosis not present

## 2016-03-09 DIAGNOSIS — E1165 Type 2 diabetes mellitus with hyperglycemia: Secondary | ICD-10-CM

## 2016-03-09 DIAGNOSIS — Z794 Long term (current) use of insulin: Secondary | ICD-10-CM

## 2016-03-09 DIAGNOSIS — N183 Chronic kidney disease, stage 3 (moderate): Secondary | ICD-10-CM

## 2016-03-09 DIAGNOSIS — IMO0002 Reserved for concepts with insufficient information to code with codable children: Secondary | ICD-10-CM

## 2016-03-09 NOTE — Progress Notes (Signed)
Subjective:    Patient ID: Christine Harvey, female    DOB: [birth year]. Patient is being seen in consultation for management of diabetes requested by  Celedonio Savage, MD  Past Medical History  Diagnosis Date  . Hypertension   . Diabetes mellitus   . Arthritis     osteoarthritis  . Depression   . 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  some iInvolvement long the Clival bone and Effect upon the Pons  . Worsening headaches   . Photophobia     With Headaches  . Weakness of left leg   . S/P radiation therapy 05/02/13    25 Gy at 5 Gy per fraction, last treatment on 04/30/13   Past Surgical History  Procedure Laterality Date  . Tonsillectomy      As a child   Social History   Social History  . Marital Status: Single    Spouse Name: N/A  . Number of Children: N/A  . Years of Education: N/A   Social History Main Topics  . Smoking status: Current Some Day Smoker  . Smokeless tobacco: None  . Alcohol Use: Yes     Comment: 2010 -  Alcohol Rehabilitation/ Drinks Occassionally  . Drug Use: No  . Sexual Activity: Not Asked   Other Topics Concern  . None   Social History Narrative   Outpatient Encounter Prescriptions as of 03/09/2016  Medication Sig  . acyclovir (ZOVIRAX) 200 MG capsule Take 200 mg by mouth as needed.  Marland Kitchen atenolol (TENORMIN) 25 MG tablet Take 50 mg by mouth daily.   . Blood Glucose Monitoring Suppl (ACCU-CHEK AVIVA) device Use as instructed  . glucose blood (ACCU-CHEK AVIVA) test strip Test glucose 4 times a day E11.65  . HYDROcodone-acetaminophen (NORCO) 7.5-325 MG tablet Take 1 tablet by mouth every 4 (four) hours as needed for moderate pain (Must last 30 days.  Do not drive or operate machinery while taking this medicine.).  . Insulin Glargine (LANTUS SOLOSTAR) 100 UNIT/ML Solostar Pen Inject 50 Units into the skin at bedtime.  Marland Kitchen lisinopril (PRINIVIL,ZESTRIL) 40 MG tablet Take 40 mg by mouth daily.  Marland Kitchen  losartan-hydrochlorothiazide (HYZAAR) 100-25 MG per tablet Take 1 tablet by mouth daily.  . naproxen (NAPROSYN) 500 MG tablet Take 500 mg by mouth 2 (two) times daily with a meal.  . potassium chloride (K-DUR) 10 MEQ tablet Take 10 mEq by mouth daily.   No facility-administered encounter medications on file as of 03/09/2016.   ALLERGIES: Allergies  Allergen Reactions  . Sulfa Antibiotics Other (See Comments)    Passes out   VACCINATION STATUS:  There is no immunization history on file for this patient.  Diabetes She presents for her follow-up diabetic visit. She has type 2 diabetes mellitus. Onset time: She was diagnosed at approximately 60 years of age. Her disease course has been improving. There are no hypoglycemic associated symptoms. Pertinent negatives for hypoglycemia include no confusion, headaches, pallor or seizures. Associated symptoms include fatigue, polydipsia and polyuria. Pertinent negatives for diabetes include no chest pain and no polyphagia. There are no hypoglycemic complications. Symptoms are improving. Diabetic complications include nephropathy. Risk factors for coronary artery disease include dyslipidemia, diabetes mellitus, obesity, hypertension, tobacco exposure, sedentary lifestyle and family history. Current diabetic treatments: She is on Lantus 40 units at bedtime and metformin 500 mg by mouth twice a day. She is following a generally unhealthy diet.  When asked about meal planning, she reported none. She has not had a previous visit with a dietitian (She will see the dietitian today.). She never participates in exercise. Home blood sugar record trend: She came with inadequate monitoring, average blood glucose is still high at 237. But she monitored only 1-2 a day. Her breakfast blood glucose range is generally 180-200 mg/dl. Her highest blood glucose is >200 mg/dl. Her overall blood glucose range is >200 mg/dl. An ACE inhibitor/angiotensin II receptor blocker is being  taken.  Hypertension This is a chronic problem. The current episode started more than 1 year ago. Pertinent negatives include no chest pain, headaches, palpitations or shortness of breath. Risk factors for coronary artery disease include smoking/tobacco exposure, diabetes mellitus, dyslipidemia, obesity and sedentary lifestyle. Past treatments include angiotensin blockers.       Review of Systems  Constitutional: Positive for fatigue. Negative for fever, chills and unexpected weight change.  HENT: Negative for trouble swallowing and voice change.   Eyes: Negative for visual disturbance.  Respiratory: Negative for cough, shortness of breath and wheezing.   Cardiovascular: Negative for chest pain, palpitations and leg swelling.  Gastrointestinal: Negative for nausea, vomiting and diarrhea.  Endocrine: Positive for polydipsia and polyuria. Negative for cold intolerance, heat intolerance and polyphagia.  Musculoskeletal: Negative for myalgias and arthralgias.  Skin: Negative for color change, pallor, rash and wound.  Neurological: Negative for seizures and headaches.  Psychiatric/Behavioral: Negative for suicidal ideas and confusion.    Objective:    BP 144/85 mmHg  Pulse 60  Ht 5\' 3"  (1.6 m)  Wt 200 lb (90.719 kg)  BMI 35.44 kg/m2  SpO2 97%  Wt Readings from Last 3 Encounters:  03/09/16 200 lb (90.719 kg)  02/26/16 198 lb (89.812 kg)  02/26/16 198 lb (89.812 kg)    Physical Exam  Constitutional: She is oriented to person, place, and time. She appears well-developed.  HENT:  Head: Normocephalic and atraumatic.  Eyes: EOM are normal.  Neck: Normal range of motion. Neck supple. No tracheal deviation present. No thyromegaly present.  Cardiovascular: Normal rate and regular rhythm.   Pulmonary/Chest: Effort normal and breath sounds normal.  Abdominal: Soft. Bowel sounds are normal. There is no tenderness. There is no guarding.  Musculoskeletal: Normal range of motion. She exhibits  no edema.  Neurological: She is alert and oriented to person, place, and time. She has normal reflexes. No cranial nerve deficit. Coordination normal.  Skin: Skin is warm and dry. No rash noted. No erythema. No pallor.  Psychiatric: She has a normal mood and affect. Judgment normal.     CMP ( most recent) CMP     Component Value Date/Time   NA 136 06/30/2011 0444   K 3.6 06/30/2011 0444   CL 102 06/30/2011 0444   CO2 25 06/30/2011 0444   GLUCOSE 143* 06/30/2011 0444   BUN 9.5 04/24/2015 0937   BUN 12 06/30/2011 0444   CREATININE 1.0 04/24/2015 0937   CREATININE 0.72 06/30/2011 0444   CALCIUM 9.1 06/30/2011 0444   GFRNONAA >60 06/30/2011 0444   GFRAA >60 06/30/2011 0444     Diabetic Labs (most recent): Lab Results  Component Value Date   HGBA1C 10.4 01/27/2016   HGBA1C 7.6* 06/29/2011     Lipid Panel ( most recent) Lipid Panel     Component Value Date/Time   CHOL 102 06/30/2011 0444   TRIG 96 06/30/2011 0444   HDL 31* 06/30/2011 0444   CHOLHDL 3.3 06/30/2011 0444   VLDL 19  06/30/2011 0444   LDLCALC 52 06/30/2011 0444     Assessment & Plan:   1. Uncontrolled type 2 diabetes mellitus with stage 3 chronic kidney disease, with long-term current use of insulin ([place])   - Patient has currently uncontrolled symptomatic type 2 DM since  60 years of age,  with most recent A1c of 10.4 %. Recent labs reviewed.   Her diabetes is complicated by stage 3 CKD and patient remains at a high risk for more acute and chronic complications of diabetes which include CAD, CVA, CKD, retinopathy, and neuropathy. These are all discussed in detail with the patient.  - I have counseled the patient on diet management and weight loss, by adopting a carbohydrate restricted/protein rich diet.  - Suggestion is made for patient to avoid simple carbohydrates   from their diet including Cakes , Desserts, Ice Cream,  Soda (  diet and regular) , Sweet Tea , Candies,  Chips, Cookies, Artificial  Sweeteners,   and "Sugar-free" Products . This will help patient to have stable blood glucose profile and potentially avoid unintended weight gain.  - I encouraged the patient to switch to  unprocessed or minimally processed complex starch and increased protein intake (animal or plant source), fruits, and vegetables.  - Patient is advised to stick to a routine mealtimes to eat 3 meals  a day and avoid unnecessary snacks ( to snack only to correct hypoglycemia).  - The patient will be scheduled with Jearld Fenton, RDN, CDE for individualized DM education.  - I have approached patient with the following individualized plan to manage diabetes and patient agrees:   - She came with inadequate monitoring, not enough to make a decision to put her on prandial insulin. - I  will proceed with basal insulin Lantus  50  units QHS,  associated with strict monitoring of glucose  AC and HS. -Based on her commitment and blood glucose readings, she may require prandial insulin when she returns in one week.  - Patient is warned not to take insulin without proper monitoring per orders. -Adjustment parameters are given for hypo and hyperglycemia in writing. -Patient is encouraged to call clinic for blood glucose levels less than 70 or above 300 mg /dl.   -Patient is not a candidate for SGLT2 inhibitors due to CKD.  - Patient will be considered for incretin therapy as appropriate next visit. - Patient specific target  A1c;  LDL, HDL, Triglycerides, and  Waist Circumference were discussed in detail.  2) BP/HTN:  controlled Continue current medications including ACEI/ARB. 3) Lipids/HPL:  controlled LDL at 57. Patient is not on statins , will consider on subsequent visits. 4)  Weight/Diet: CDE Consult will be initiated , exercise, and detailed carbohydrates information provided.  5) Chronic Care/Health Maintenance:  -Patient is on ACEI/ARB  medications and encouraged to continue to follow up with Ophthalmology,  Podiatrist at least yearly or according to recommendations, and advised to   quit smoking. I have recommended yearly flu vaccine and pneumonia vaccination at least every 5 years; moderate intensity exercise for up to 150 minutes weekly; and  sleep for at least 7 hours a day.  - 25 minutes of time was spent on the care of this patient , 50% of which was applied for counseling on diabetes complications and their preventions.  - Patient to bring meter and  blood glucose logs during their next visit.  -Extensive smoking cessation advice and counseling given to her. - I advised patient to maintain close  follow up with Celedonio Savage, MD for primary care needs.  Follow up plan: - Return in about 10 weeks (around 05/18/2016) for diabetes, high blood pressure, high cholesterol, follow up with pre-visit labs, meter, and logs.  Glade Lloyd, MD Phone: [phone]  Fax: [phone]   03/09/2016, 8:46 AM

## 2016-03-09 NOTE — Patient Instructions (Signed)

## 2016-03-15 ENCOUNTER — Ambulatory Visit: Payer: Medicaid Other | Admitting: Nutrition

## 2016-04-08 ENCOUNTER — Telehealth: Payer: Self-pay | Admitting: Orthopaedic Surgery

## 2016-04-08 MED ORDER — HYDROCODONE-ACETAMINOPHEN 7.5-325 MG PO TABS: 1.0000 | ORAL_TABLET | ORAL | Status: DC | PRN

## 2016-04-08 NOTE — Telephone Encounter (Signed)
Rx done. 

## 2016-04-08 NOTE — Telephone Encounter (Signed)
Patient called and requested Norco  7.5-325 mgs.  Qty 120  Sig: Take 1 tablet by mouth every 4 (four) hours as needed for moderate pain (Must last 30 days. Do not drive or operate machinery while taking this medicine.).

## 2016-04-20 ENCOUNTER — Other Ambulatory Visit: Payer: Self-pay | Admitting: Radiation Therapy

## 2016-04-20 DIAGNOSIS — D329 Benign neoplasm of meninges, unspecified: Secondary | ICD-10-CM

## 2016-04-22 ENCOUNTER — Ambulatory Visit (INDEPENDENT_AMBULATORY_CARE_PROVIDER_SITE_OTHER): Payer: Medicaid Other | Admitting: Orthopaedic Surgery

## 2016-04-22 ENCOUNTER — Encounter: Payer: Self-pay | Admitting: Orthopaedic Surgery

## 2016-04-22 VITALS — BP 138/85 | HR 70 | Temp 97.3°F | Ht 63.0 in | Wt 200.0 lb

## 2016-04-22 DIAGNOSIS — Z794 Long term (current) use of insulin: Secondary | ICD-10-CM

## 2016-04-22 DIAGNOSIS — M25561 Pain in right knee: Secondary | ICD-10-CM | POA: Diagnosis not present

## 2016-04-22 DIAGNOSIS — M5442 Lumbago with sciatica, left side: Secondary | ICD-10-CM | POA: Diagnosis not present

## 2016-04-22 DIAGNOSIS — E1165 Type 2 diabetes mellitus with hyperglycemia: Secondary | ICD-10-CM

## 2016-04-22 DIAGNOSIS — I10 Essential (primary) hypertension: Secondary | ICD-10-CM

## 2016-04-22 DIAGNOSIS — E1122 Type 2 diabetes mellitus with diabetic chronic kidney disease: Secondary | ICD-10-CM

## 2016-04-22 DIAGNOSIS — N183 Chronic kidney disease, stage 3 (moderate): Secondary | ICD-10-CM

## 2016-04-22 DIAGNOSIS — IMO0002 Reserved for concepts with insufficient information to code with codable children: Secondary | ICD-10-CM

## 2016-04-22 NOTE — Progress Notes (Signed)
Patient [id number] Christine Harvey, female DOB:[birth year], 60 y.o. [id number]  Chief Complaint  Patient presents with  . Follow-up    Right knee    HPI  Christine Harvey is a 60 y.o. female who has had right knee pain chronically.  It is stable.  She has some swelling and popping but no giving way, no locking.  She is walking better.  She is taking her medicine.  She has developed lower back pain with paresthesias to the left side.  She was seen in the ER at [facility] and had a MRI of the back. I do not have copies of the report or the CD to review. She says she cannot wait here for the report to be faxed as she has another appointment shortly.  She will sign a release for the report and come back next week.  That is what she wants to do rather than wait.  HPI  Body mass index is 35.44 kg/(m^2). Marland Kitchen  Review of Systems  HENT: Negative for congestion.   Eyes: Negative.   Respiratory: Negative for cough and shortness of breath.   Cardiovascular: Negative for chest pain and leg swelling.       Hypertension.  Endocrine:       Diabetes mellitus on insulin  Musculoskeletal: Positive for joint swelling, arthralgias and gait problem.  Neurological: Positive for headaches.       Post benign brain tumor, no residual except slight weakness of the left lower leg at times, varies.    Past Medical History  Diagnosis Date  . Hypertension   . Diabetes mellitus   . Arthritis     osteoarthritis  . Depression   . 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  some iInvolvement long the Clival bone and Effect upon the Pons  . Worsening headaches   . Photophobia     With Headaches  . Weakness of left leg   . S/P radiation therapy 05/02/13    25 Gy at 5 Gy per fraction, last treatment on 04/30/13    Past Surgical History  Procedure Laterality Date  . Tonsillectomy      As a child    Family History  Problem Relation Age of Onset  . Hypertension Mother    . CAD Mother   . Hypertension Father   . Stroke Father   . Cancer Sister     Social History Social History  Substance Use Topics  . Smoking status: Current Some Day Smoker  . Smokeless tobacco: None  . Alcohol Use: Yes     Comment: 2010 -  Alcohol Rehabilitation/ Drinks Occassionally    Allergies  Allergen Reactions  . Sulfa Antibiotics Other (See Comments)    Passes out    Current Outpatient Prescriptions  Medication Sig Dispense Refill  . acyclovir (ZOVIRAX) 200 MG capsule Take 200 mg by mouth as needed.    Marland Kitchen atenolol (TENORMIN) 25 MG tablet Take 50 mg by mouth daily.     . Blood Glucose Monitoring Suppl (ACCU-CHEK AVIVA) device Use as instructed 1 each 0  . glucose blood (ACCU-CHEK AVIVA) test strip Test glucose 4 times a day E11.65 150 each 3  . HYDROcodone-acetaminophen (NORCO) 7.5-325 MG tablet Take 1 tablet by mouth every 4 (four) hours as needed for moderate pain (Must last 30 days.  Do not drive or operate machinery while taking this medicine.). 120 tablet 0  . Insulin  Glargine (LANTUS SOLOSTAR) 100 UNIT/ML Solostar Pen Inject 50 Units into the skin at bedtime.    Marland Kitchen lisinopril (PRINIVIL,ZESTRIL) 40 MG tablet Take 40 mg by mouth daily.    Marland Kitchen losartan-hydrochlorothiazide (HYZAAR) 100-25 MG per tablet Take 1 tablet by mouth daily.    . naproxen (NAPROSYN) 500 MG tablet Take 500 mg by mouth 2 (two) times daily with a meal.    . potassium chloride (K-DUR) 10 MEQ tablet Take 10 mEq by mouth daily.     No current facility-administered medications for this visit.     Physical Exam  Blood pressure 138/85, pulse 70, temperature 97.3 F (36.3 C), height 5\' 3"  (1.6 m), weight 200 lb (90.719 kg).  Constitutional: overall normal hygiene, normal nutrition, well developed, normal grooming, normal body habitus. Assistive device:none  Musculoskeletal: gait and station Limp none, muscle tone and strength are normal, no tremors or atrophy is present.  .  Neurological:  coordination overall normal.  Deep tendon reflex/nerve stretch intact.  Sensation normal.  Cranial nerves II-XII intact.   Skin:   normal overall no scars, lesions, ulcers or rashes. No psoriasis.  Psychiatric: Alert and oriented x 3.  Recent memory intact, remote memory unclear.  Normal mood and affect. Well groomed.  Good eye contact.  Cardiovascular: overall no swelling, no varicosities, no edema bilaterally, normal temperatures of the legs and arms, no clubbing, cyanosis and good capillary refill.  Lymphatic: palpation is normal. The right lower extremity is examined:  Inspection:  Thigh:  Non-tender and no defects  Knee has swelling 1+ effusion.                        Joint tenderness is present                        Patient is tender over the medial joint line  Lower Leg:  Has normal appearance and no tenderness or defects  Ankle:  Non-tender and no defects  Foot:  Non-tender and no defects Range of Motion:  Knee:  Range of motion is: 0-105                        Crepitus is not  present  Ankle:  Range of motion is normal. Strength and Tone:  The right lower extremity has normal strength and tone. Stability:  Knee:  The knee is stable.  Ankle:  The ankle is stable.    The patient has been educated about the nature of the problem(s) and counseled on treatment options.  The patient appeared to understand what I have discussed and is in agreement with it.  Encounter Diagnoses  Name Primary?  . Right knee pain Yes  . Left-sided low back pain with left-sided sciatica   . Essential hypertension, benign   . Uncontrolled type 2 diabetes mellitus with stage 3 chronic kidney disease, with long-term current use of insulin ([place])     PLAN Call if any problems.  Precautions discussed.  Continue current medications.   Return to clinic 1 week

## 2016-04-29 ENCOUNTER — Encounter: Payer: Self-pay | Admitting: Orthopaedic Surgery

## 2016-04-29 ENCOUNTER — Ambulatory Visit (INDEPENDENT_AMBULATORY_CARE_PROVIDER_SITE_OTHER): Payer: Medicaid Other | Admitting: Orthopaedic Surgery

## 2016-04-29 VITALS — BP 134/78 | HR 70 | Temp 97.3°F | Ht 63.0 in | Wt 200.0 lb

## 2016-04-29 DIAGNOSIS — I10 Essential (primary) hypertension: Secondary | ICD-10-CM | POA: Diagnosis not present

## 2016-04-29 DIAGNOSIS — M25561 Pain in right knee: Secondary | ICD-10-CM | POA: Diagnosis not present

## 2016-04-29 DIAGNOSIS — M4806 Spinal stenosis, lumbar region: Secondary | ICD-10-CM

## 2016-04-29 DIAGNOSIS — M48061 Spinal stenosis, lumbar region without neurogenic claudication: Secondary | ICD-10-CM

## 2016-04-29 DIAGNOSIS — M5442 Lumbago with sciatica, left side: Secondary | ICD-10-CM | POA: Diagnosis not present

## 2016-04-29 NOTE — Progress Notes (Signed)
Patient [id number] Christine Harvey, female DOB:[birth year], 60 y.o. [id number]  Chief Complaint  Patient presents with  . Follow-up    1 week follow up and review MRI     HPI  Christine Harvey is a 60 y.o. female who has right knee pain and lower back pain.  The right knee is still tender.  She has less swelling.  She has no trauma.  She has no giving way.  She has no locking.  She does not want to consider injection.  She is taking her medicine.  Ice helps too.  She has had lower back pain.  She had a MRI at [facility] on 03-21-16.  I got the report today.  She has lower back pain with left sided sciatica that is not improving. She has tried the medicine and exercises with little help.  The MRI shows significant multilevel canal and foraminal narrowing.  I have explained the report to her.  More foraminal narrowing is on the right side but she has pain on the left side.  I will have her see Dr. Carloyn Manner, neurosurgeon, in [place].  An appointment will be made.  She is agreeable to this.  Her blood sugars are better controlled now.  Her hypertension is also controlled.   HPI  Body mass index is 35.44 kg/(m^2).  Review of Systems  HENT: Negative for congestion.   Eyes: Negative.   Respiratory: Negative for cough and shortness of breath.   Cardiovascular: Negative for chest pain and leg swelling.       Hypertension.  Endocrine:       Diabetes mellitus on insulin  Musculoskeletal: Positive for joint swelling, arthralgias and gait problem.  Neurological: Positive for headaches.       Post benign brain tumor, no residual except slight weakness of the left lower leg at times, varies.    Past Medical History  Diagnosis Date  . Hypertension   . Diabetes mellitus   . Arthritis     osteoarthritis  . Depression   . 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  some iInvolvement long the Clival bone and Effect upon the Pons  . Worsening headaches    . Photophobia     With Headaches  . Weakness of left leg   . S/P radiation therapy 05/02/13    25 Gy at 5 Gy per fraction, last treatment on 04/30/13    Past Surgical History  Procedure Laterality Date  . Tonsillectomy      As a child    Family History  Problem Relation Age of Onset  . Hypertension Mother   . CAD Mother   . Hypertension Father   . Stroke Father   . Cancer Sister     Social History Social History  Substance Use Topics  . Smoking status: Current Some Day Smoker  . Smokeless tobacco: None  . Alcohol Use: Yes     Comment: 2010 -  Alcohol Rehabilitation/ Drinks Occassionally    Allergies  Allergen Reactions  . Sulfa Antibiotics Other (See Comments)    Passes out    Current Outpatient Prescriptions  Medication Sig Dispense Refill  . acyclovir (ZOVIRAX) 200 MG capsule Take 200 mg by mouth as needed.    Marland Kitchen atenolol (TENORMIN) 25 MG tablet Take 50 mg by mouth daily.     . Blood Glucose Monitoring Suppl (ACCU-CHEK AVIVA) device Use as instructed 1 each 0  .  glucose blood (ACCU-CHEK AVIVA) test strip Test glucose 4 times a day E11.65 150 each 3  . HYDROcodone-acetaminophen (NORCO) 7.5-325 MG tablet Take 1 tablet by mouth every 4 (four) hours as needed for moderate pain (Must last 30 days.  Do not drive or operate machinery while taking this medicine.). 120 tablet 0  . Insulin Glargine (LANTUS SOLOSTAR) 100 UNIT/ML Solostar Pen Inject 50 Units into the skin at bedtime.    Marland Kitchen lisinopril (PRINIVIL,ZESTRIL) 40 MG tablet Take 40 mg by mouth daily.    Marland Kitchen losartan-hydrochlorothiazide (HYZAAR) 100-25 MG per tablet Take 1 tablet by mouth daily.    . naproxen (NAPROSYN) 500 MG tablet Take 500 mg by mouth 2 (two) times daily with a meal.    . potassium chloride (K-DUR) 10 MEQ tablet Take 10 mEq by mouth daily.     No current facility-administered medications for this visit.     Physical Exam  Blood pressure 134/78, pulse 70, temperature 97.3 F (36.3 C), height 5\' 3"   (1.6 m), weight 200 lb (90.719 kg).  Constitutional: overall normal hygiene, normal nutrition, well developed, normal grooming, normal body habitus. Assistive device:none  Musculoskeletal: gait and station Limp right, muscle tone and strength are normal, no tremors or atrophy is present.  .  Neurological: coordination overall normal.  Deep tendon reflex/nerve stretch intact.  Sensation normal.  Cranial nerves II-XII intact.   Skin:   normal overall no scars, lesions, ulcers or rashes. No psoriasis.  Psychiatric: Alert and oriented x 3.  Recent memory intact, remote memory unclear.  Normal mood and affect. Well groomed.  Good eye contact.  Cardiovascular: overall no swelling, no varicosities, no edema bilaterally, normal temperatures of the legs and arms, no clubbing, cyanosis and good capillary refill.  Lymphatic: palpation is normal.  The right lower extremity is examined:  Inspection:  Thigh:  Non-tender and no defects  Knee has swelling 1+ effusion.                        Joint tenderness is present                        Patient is tender over the medial joint line  Lower Leg:  Has normal appearance and no tenderness or defects  Ankle:  Non-tender and no defects  Foot:  Non-tender and no defects Range of Motion:  Knee:  Range of motion is: 0-110                        Crepitus is  present  Ankle:  Range of motion is normal. Strength and Tone:  The right lower extremity has normal strength and tone. Stability:  Knee:  The knee is stable.  Ankle:  The ankle is stable.  The left knee is negative.  Spine/Pelvis examination:  Inspection:  Overall, sacoiliac joint benign and hips nontender; without crepitus or defects.   Thoracic spine inspection: Alignment normal without kyphosis present   Lumbar spine inspection:  Alignment  with normal lumbar lordosis, without scoliosis apparent.   Thoracic spine palpation:  without tenderness of spinal processes   Lumbar spine  palpation: with tenderness of lumbar area; without tightness of lumbar muscles    Range of Motion:   Lumbar flexion, forward flexion is 40  without pain or tenderness    Lumbar extension is 5  without pain or tenderness   Left lateral bend is Normal  without  pain or tenderness   Right lateral bend is Normal without pain or tenderness   Straight leg raising is Normal   Strength & tone: Normal   Stability overall normal stability    The patient has been educated about the nature of the problem(s) and counseled on treatment options.  The patient appeared to understand what I have discussed and is in agreement with it.  Encounter Diagnoses  Name Primary?  . Lumbar canal stenosis Yes  . Right knee pain   . Left-sided low back pain with left-sided sciatica   . Essential hypertension, benign     PLAN Call if any problems.  Precautions discussed.  Continue current medications.   Return to clinic to see Dr. Carloyn Manner.

## 2016-04-29 NOTE — Patient Instructions (Signed)
WE WILL REFER YOU TO DR Carloyn Manner

## 2016-04-30 ENCOUNTER — Other Ambulatory Visit: Payer: Medicaid Other

## 2016-04-30 ENCOUNTER — Ambulatory Visit
Admission: RE | Admit: 2016-04-30 | Discharge: 2016-04-30 | Disposition: A | Discharge status: Home / Self Care | Payer: Medicaid Other | Source: Ambulatory Visit | Attending: Radiation Oncology | Admitting: Radiation Oncology

## 2016-04-30 DIAGNOSIS — D329 Benign neoplasm of meninges, unspecified: Secondary | ICD-10-CM

## 2016-04-30 MED ORDER — GADOBENATE DIMEGLUMINE 529 MG/ML IV SOLN
19.0000 mL | Freq: Once | INTRAVENOUS | Status: AC | PRN
  Administered 2016-04-30: 19 mL via INTRAVENOUS

## 2016-05-05 ENCOUNTER — Ambulatory Visit: Admission: RE | Admit: 2016-05-05 | Payer: Medicaid Other | Source: Ambulatory Visit | Admitting: Radiation Oncology

## 2016-05-06 ENCOUNTER — Telehealth: Payer: Self-pay | Admitting: Orthopaedic Surgery

## 2016-05-06 MED ORDER — HYDROCODONE-ACETAMINOPHEN 7.5-325 MG PO TABS: 1.0000 | ORAL_TABLET | ORAL | Status: DC | PRN

## 2016-05-06 NOTE — Telephone Encounter (Signed)
Patient requests a refill on Hydrocodone-Acetaminophen(Norco) 7-.5-325 mgs.  Qty 120   Sig: Take 1 tablet by mouth every 4 (four) hours as needed for moderate pain (Must last 30 days. Do not drive or operate machinery while taking this medicine.).

## 2016-05-06 NOTE — Telephone Encounter (Signed)
Rx done. 

## 2016-05-11 NOTE — Progress Notes (Signed)
Radiation Oncology         [phone] ________________________________  Name: Christine Harvey MRN: [id number]  Date: 05/12/2016  DOB: April 12, 1956  Follow-Up Visit Note  CC: Celedonio Savage, MD  Phillips Odor, MD  Diagnosis:     ICD-9-CM ICD-10-CM   1. Benign meningioma (HCC) 225.2 D32.0    Right clivus meningioma target was treated using 12 IMRT Beams to a prescription dose of 25 Gy at 5 Gy per fraction, last treatment on 04/30/13   Narrative:  The patient returns today for routine follow-up. MRI of brain on 04-30-16 revealed that her meningioma is stable. There is still slight mass effect on the ventral pons.  She continues to have a HA which she has had for years.  She denies dizziness, nausea, or new neurologic issues. She reports that she may see Dr Carloyn Manner soon for "spinal inflammation"... Recent orthopedic notes indicate dx of lumbar canal stenosis and left sided sciatica.  ALLERGIES:  is allergic to sulfa antibiotics.  Meds: Current Outpatient Prescriptions  Medication Sig Dispense Refill  . acyclovir (ZOVIRAX) 200 MG capsule Take 200 mg by mouth as needed.    Marland Kitchen atenolol (TENORMIN) 25 MG tablet Take 50 mg by mouth daily.     . Blood Glucose Monitoring Suppl (ACCU-CHEK AVIVA) device Use as instructed 1 each 0  . glucose blood (ACCU-CHEK AVIVA) test strip Test glucose 4 times a day E11.65 150 each 3  . HYDROcodone-acetaminophen (NORCO) 7.5-325 MG tablet Take 1 tablet by mouth every 4 (four) hours as needed for moderate pain (Must last 30 days.  Do not drive or operate machinery while taking this medicine.). 120 tablet 0  . Insulin Glargine (LANTUS SOLOSTAR) 100 UNIT/ML Solostar Pen Inject 50 Units into the skin at bedtime.    Marland Kitchen lisinopril (PRINIVIL,ZESTRIL) 40 MG tablet Take 40 mg by mouth daily.    Marland Kitchen losartan-hydrochlorothiazide (HYZAAR) 100-25 MG per tablet Take 1 tablet by mouth daily.    . naproxen (NAPROSYN) 500 MG tablet Take 500 mg by mouth 2 (two) times daily with a meal.      . potassium chloride (K-DUR) 10 MEQ tablet Take 10 mEq by mouth daily.     No current facility-administered medications for this encounter.    Physical Findings:  Filed Vitals:   05/12/16 1125  BP: 118/86  Pulse: 70  Temp: 98.4 F (36.9 C)   The patient is in no acute distress. Patient is alert and oriented. Oropharynx clear. Slight LLE weakness.  Cranial nerves grossly intact although possibly a faint drop of upper right lip. Heart RRR with a murmur , systolic, most audible at the aortic region.  Lungs CTAB.  Lab Findings: Lab Results  Component Value Date   WBC 9.0 06/30/2011   HGB 10.5* 06/30/2011   HCT 31.9* 06/30/2011   MCV 84.4 06/30/2011   PLT 319 06/30/2011      Chemistry      Component Value Date/Time   NA 136 06/30/2011 0444   K 3.6 06/30/2011 0444   CL 102 06/30/2011 0444   CO2 25 06/30/2011 0444   BUN 9.5 04/24/2015 0937   BUN 12 06/30/2011 0444   CREATININE 1.0 04/24/2015 0937   CREATININE 0.72 06/30/2011 0444      Component Value Date/Time   CALCIUM 9.1 06/30/2011 0444       Radiographic Findings: MRI BRAIN REPORT IMPRESSION: Stable examination. Meningioma of the clivus measuring 23 x 20 x 12 mm. Partial encasement of the basilar artery and slight  mass-effect upon the ventral pons.  Chronic small vessel ischemic changes throughout the brain without evidence of recent insult.   Impression:  Good response to [facility] treatment. No evidence of progressive disease. MRI shows radiographic stability.     Plan:  MRI in 12 months, then follow-up with myself or Dr. Vertell Limber of [facility].  I will ask Mont Dutton, RT to coordinate.  Patient informed of her heart murmur and advised to notify her PCP about this.  She believes that I have told her about this heart murmur previously. ---------------------------------------------------------------------------------  Eppie Gibson, MD

## 2016-05-12 ENCOUNTER — Ambulatory Visit
Admission: RE | Admit: 2016-05-12 | Discharge: 2016-05-12 | Disposition: A | Discharge status: Home / Self Care | Payer: Medicaid Other | Source: Ambulatory Visit | Attending: Radiation Oncology | Admitting: Radiation Oncology

## 2016-05-12 ENCOUNTER — Encounter: Payer: Self-pay | Admitting: Radiation Oncology

## 2016-05-12 ENCOUNTER — Ambulatory Visit: Payer: Medicaid Other | Admitting: Radiation Oncology

## 2016-05-12 VITALS — BP 118/86 | HR 70 | Temp 98.4°F | Ht 63.0 in | Wt 193.3 lb

## 2016-05-12 DIAGNOSIS — D329 Benign neoplasm of meninges, unspecified: Secondary | ICD-10-CM

## 2016-05-12 DIAGNOSIS — M4806 Spinal stenosis, lumbar region: Secondary | ICD-10-CM | POA: Diagnosis not present

## 2016-05-12 DIAGNOSIS — Z79899 Other long term (current) drug therapy: Secondary | ICD-10-CM | POA: Diagnosis not present

## 2016-05-12 DIAGNOSIS — Z882 Allergy status to sulfonamides status: Secondary | ICD-10-CM | POA: Diagnosis not present

## 2016-05-12 DIAGNOSIS — Z794 Long term (current) use of insulin: Secondary | ICD-10-CM | POA: Diagnosis not present

## 2016-05-12 NOTE — Progress Notes (Signed)
Ms. Hoxie is here for follow up of radiation completed 04/30/13 to a Right Clivus Meningioma. She reports a constant headache which she rates a 7/10 today. She is taking hydrocodone for her knee pain, and she reports it helps her headache.  She denies dizziness, or nausea. She does have some difficulty ambulating, but feels like it might be from "inflamation on her spine" which she has been told she has. She also reports a decreased appetite recently, and food does not taste the same.   BP 118/86 mmHg  Pulse 70  Temp(Src) 98.4 F (36.9 C)  Ht 5\' 3"  (1.6 m)  Wt 193 lb 4.8 oz (87.68 kg)  BMI 34.25 kg/m2  SpO2 100%   Wt Readings from Last 3 Encounters:  05/12/16 193 lb 4.8 oz (87.68 kg)  04/29/16 200 lb (90.719 kg)  04/22/16 200 lb (90.719 kg)

## 2016-05-13 DIAGNOSIS — D329 Benign neoplasm of meninges, unspecified: Secondary | ICD-10-CM | POA: Insufficient documentation

## 2016-05-13 NOTE — Addendum Note (Signed)
Encounter addended by: Ernst Spell, RN on: 05/13/2016  9:22 AM<BR>     Documentation filed: Charges VN

## 2016-05-14 LAB — HEMOGLOBIN A1C: Hemoglobin A1C: 11.4

## 2016-05-20 ENCOUNTER — Ambulatory Visit (INDEPENDENT_AMBULATORY_CARE_PROVIDER_SITE_OTHER): Payer: Medicaid Other | Admitting: "Endocrinology

## 2016-05-20 ENCOUNTER — Encounter: Payer: Self-pay | Admitting: "Endocrinology

## 2016-05-20 VITALS — BP 142/89 | HR 69 | Ht 63.0 in | Wt 192.0 lb

## 2016-05-20 DIAGNOSIS — E785 Hyperlipidemia, unspecified: Secondary | ICD-10-CM | POA: Diagnosis not present

## 2016-05-20 DIAGNOSIS — I10 Essential (primary) hypertension: Secondary | ICD-10-CM

## 2016-05-20 DIAGNOSIS — E1165 Type 2 diabetes mellitus with hyperglycemia: Secondary | ICD-10-CM

## 2016-05-20 DIAGNOSIS — Z794 Long term (current) use of insulin: Secondary | ICD-10-CM | POA: Diagnosis not present

## 2016-05-20 DIAGNOSIS — E118 Type 2 diabetes mellitus with unspecified complications: Secondary | ICD-10-CM | POA: Diagnosis not present

## 2016-05-20 DIAGNOSIS — IMO0002 Reserved for concepts with insufficient information to code with codable children: Secondary | ICD-10-CM

## 2016-05-20 NOTE — Patient Instructions (Signed)

## 2016-05-20 NOTE — Progress Notes (Signed)
Subjective:    Patient ID: Christine Harvey, female    DOB: 28-Feb-1956. Patient is being seen in f/u for management of diabetes requested by  Celedonio Savage, MD  Past Medical History  Diagnosis Date  . Hypertension   . Diabetes mellitus   . Arthritis     osteoarthritis  . Depression   . 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  some iInvolvement long the Clival bone and Effect upon the Pons  . Worsening headaches   . Photophobia     With Headaches  . Weakness of left leg   . S/P radiation therapy 05/02/13    25 Gy at 5 Gy per fraction, last treatment on 04/30/13   Past Surgical History  Procedure Laterality Date  . Tonsillectomy      As a child   Social History   Social History  . Marital Status: Single    Spouse Name: N/A  . Number of Children: N/A  . Years of Education: N/A   Social History Main Topics  . Smoking status: Current Some Day Smoker  . Smokeless tobacco: None  . Alcohol Use: Yes     Comment: 2010 -  Alcohol Rehabilitation/ Drinks Occassionally  . Drug Use: No  . Sexual Activity: Not Asked   Other Topics Concern  . None   Social History Narrative   Outpatient Encounter Prescriptions as of 05/20/2016  Medication Sig  . metFORMIN (GLUCOPHAGE) 500 MG tablet Take 500 mg by mouth 2 (two) times daily with a meal.  . acyclovir (ZOVIRAX) 200 MG capsule Take 200 mg by mouth as needed.  Marland Kitchen atenolol (TENORMIN) 25 MG tablet Take 50 mg by mouth daily.   . Blood Glucose Monitoring Suppl (ACCU-CHEK AVIVA) device Use as instructed  . glucose blood (ACCU-CHEK AVIVA) test strip Test glucose 4 times a day E11.65  . HYDROcodone-acetaminophen (NORCO) 7.5-325 MG tablet Take 1 tablet by mouth every 4 (four) hours as needed for moderate pain (Must last 30 days.  Do not drive or operate machinery while taking this medicine.).  . Insulin Glargine (LANTUS SOLOSTAR) 100 UNIT/ML Solostar Pen Inject 50 Units into the skin at bedtime.   Marland Kitchen lisinopril (PRINIVIL,ZESTRIL) 40 MG tablet Take 40 mg by mouth daily.  Marland Kitchen losartan-hydrochlorothiazide (HYZAAR) 100-25 MG per tablet Take 1 tablet by mouth daily.  . naproxen (NAPROSYN) 500 MG tablet Take 500 mg by mouth 2 (two) times daily with a meal.  . potassium chloride (K-DUR) 10 MEQ tablet Take 10 mEq by mouth daily.   No facility-administered encounter medications on file as of 05/20/2016.   ALLERGIES: Allergies  Allergen Reactions  . Sulfa Antibiotics Other (See Comments)    Passes out   VACCINATION STATUS:  There is no immunization history on file for this patient.  Diabetes She presents for her follow-up diabetic visit. She has type 2 diabetes mellitus. Onset time: She was diagnosed at approximately 60 years of age. Her disease course has been worsening. There are no hypoglycemic associated symptoms. Pertinent negatives for hypoglycemia include no confusion, headaches, pallor or seizures. Associated symptoms include fatigue, polydipsia and polyuria. Pertinent negatives for diabetes include no chest pain and no polyphagia. There are no hypoglycemic complications. Symptoms are worsening. Diabetic complications include nephropathy. Risk factors for coronary artery disease include dyslipidemia, diabetes mellitus, obesity, hypertension, tobacco exposure, sedentary lifestyle and family history. Current diabetic treatments: She is on Lantus 40  units at bedtime and metformin 500 mg by mouth twice a day. She is following a generally unhealthy diet. When asked about meal planning, she reported none. She has not had a previous visit with a dietitian (She will see the dietitian today.). She never participates in exercise. Home blood sugar record trend: She came with inadequate monitoring, average blood glucose is still high at 237. But she monitored only 1-2 a day. Her breakfast blood glucose range is generally >200 mg/dl. Her overall blood glucose range is >200 mg/dl. An ACE inhibitor/angiotensin  II receptor blocker is being taken.  Hypertension This is a chronic problem. The current episode started more than 1 year ago. Pertinent negatives include no chest pain, headaches, palpitations or shortness of breath. Risk factors for coronary artery disease include smoking/tobacco exposure, diabetes mellitus, dyslipidemia, obesity and sedentary lifestyle. Past treatments include angiotensin blockers.       Review of Systems  Constitutional: Positive for fatigue. Negative for fever, chills and unexpected weight change.  HENT: Negative for trouble swallowing and voice change.   Eyes: Negative for visual disturbance.  Respiratory: Negative for cough, shortness of breath and wheezing.   Cardiovascular: Negative for chest pain, palpitations and leg swelling.  Gastrointestinal: Negative for nausea, vomiting and diarrhea.  Endocrine: Positive for polydipsia and polyuria. Negative for cold intolerance, heat intolerance and polyphagia.  Musculoskeletal: Negative for myalgias and arthralgias.  Skin: Negative for color change, pallor, rash and wound.  Neurological: Negative for seizures and headaches.  Psychiatric/Behavioral: Negative for suicidal ideas and confusion.    Objective:    BP 142/89 mmHg  Pulse 69  Ht 5\' 3"  (1.6 m)  Wt 192 lb (87.091 kg)  BMI 34.02 kg/m2  SpO2 97%  Wt Readings from Last 3 Encounters:  05/20/16 192 lb (87.091 kg)  05/12/16 193 lb 4.8 oz (87.68 kg)  04/29/16 200 lb (90.719 kg)    Physical Exam  Constitutional: She is oriented to person, place, and time. She appears well-developed.  HENT:  Head: Normocephalic and atraumatic.  Eyes: EOM are normal.  Neck: Normal range of motion. Neck supple. No tracheal deviation present. No thyromegaly present.  Cardiovascular: Normal rate and regular rhythm.   Pulmonary/Chest: Effort normal and breath sounds normal.  Abdominal: Soft. Bowel sounds are normal. There is no tenderness. There is no guarding.  Musculoskeletal:  Normal range of motion. She exhibits no edema.  Neurological: She is alert and oriented to person, place, and time. She has normal reflexes. No cranial nerve deficit. Coordination normal.  Skin: Skin is warm and dry. No rash noted. No erythema. No pallor.  Psychiatric: She has a normal mood and affect. Judgment normal.     CMP ( most recent) CMP     Component Value Date/Time   NA 136 06/30/2011 0444   K 3.6 06/30/2011 0444   CL 102 06/30/2011 0444   CO2 25 06/30/2011 0444   GLUCOSE 143* 06/30/2011 0444   BUN 9.5 04/24/2015 0937   BUN 12 06/30/2011 0444   CREATININE 1.0 04/24/2015 0937   CREATININE 0.72 06/30/2011 0444   CALCIUM 9.1 06/30/2011 0444   GFRNONAA >60 06/30/2011 0444   GFRAA >60 06/30/2011 0444     Diabetic Labs (most recent): Lab Results  Component Value Date   HGBA1C 11.4 05/14/2016   HGBA1C 10.4 01/27/2016   HGBA1C 7.6* 06/29/2011    Lipid Panel     Component Value Date/Time   CHOL 102 06/30/2011 0444   TRIG 96 06/30/2011 0444   HDL  31* 06/30/2011 0444   CHOLHDL 3.3 06/30/2011 0444   VLDL 19 06/30/2011 0444   LDLCALC 52 06/30/2011 0444     Assessment & Plan:   1. Uncontrolled type 2 diabetes mellitus with stage 3 chronic kidney disease, with long-term current use of insulin ([place])   - Patient has currently uncontrolled symptomatic type 2 DM since  59 years of age,  with most recent A1c 11.4% increasing from 10.4 %. Recent labs reviewed.   Her diabetes is complicated by stage 3 CKD and patient remains at a high risk for more acute and chronic complications of diabetes which include CAD, CVA, CKD, retinopathy, and neuropathy. These are all discussed in detail with the patient.  - I have counseled the patient on diet management and weight loss, by adopting a carbohydrate restricted/protein rich diet.  - Suggestion is made for patient to avoid simple carbohydrates   from their diet including Cakes , Desserts, Ice Cream,  Soda (  diet and regular) ,  Sweet Tea , Candies,  Chips, Cookies, Artificial Sweeteners,   and "Sugar-free" Products . This will help patient to have stable blood glucose profile and potentially avoid unintended weight gain.  - I encouraged the patient to switch to  unprocessed or minimally processed complex starch and increased protein intake (animal or plant source), fruits, and vegetables.  - Patient is advised to stick to a routine mealtimes to eat 3 meals  a day and avoid unnecessary snacks ( to snack only to correct hypoglycemia).  - The patient will be scheduled with Jearld Fenton, RDN, CDE for individualized DM education.  - I have approached patient with the following individualized plan to manage diabetes and patient agrees:   - Based on her significant glycemic burden, she will need basal/bolus insulin if she can engage properly for appropriate monitoring.   - I  will proceed with basal insulin Lantus  50  units QHS,  associated with strict monitoring of glucose  AC and HS. -She will return in 1 week with meter and logs to review. -Her other option would be premixed insulin NovoLog 70/30 or Humalog 75/25 to use twice a day with breakfast and supper. -Since her renal function is improving, I will initiate metformin 500 mg by mouth twice a day.  -Patient is encouraged to call clinic for blood glucose levels less than 70 or above 300 mg /dl.   -Patient is not a candidate for SGLT2 inhibitors due to CKD.  - Patient will be considered for incretin therapy as appropriate next visit. - Patient specific target  A1c;  LDL, HDL, Triglycerides, and  Waist Circumference were discussed in detail.  2) BP/HTN:  uncontrolled Continue current medications including ACEI/ARB. 3) Lipids/HPL:  controlled LDL at 57. Patient is not on statins , will consider on subsequent visits. 4)  Weight/Diet: CDE Consult will be initiated , exercise, and detailed carbohydrates information provided.  5) Chronic Care/Health  Maintenance:  -Patient is on ACEI/ARB  medications and encouraged to continue to follow up with Ophthalmology, Podiatrist at least yearly or according to recommendations, and advised to   quit smoking. I have recommended yearly flu vaccine and pneumonia vaccination at least every 5 years; moderate intensity exercise for up to 150 minutes weekly; and  sleep for at least 7 hours a day.  - 25 minutes of time was spent on the care of this patient , 50% of which was applied for counseling on diabetes complications and their preventions.  - Patient to bring  meter and  blood glucose logs during their next visit.  -Extensive smoking cessation advice and counseling given to her. - I advised patient to maintain close follow up with Celedonio Savage, MD for primary care needs.  Follow up plan: - Return in about 1 week (around 05/27/2016) for diabetes, high blood pressure, high cholesterol, follow up with meter and logs- no labs.  Glade Lloyd, MD Phone: [phone]  Fax: [phone]   05/20/2016, 2:11 PM

## 2016-05-28 ENCOUNTER — Ambulatory Visit: Payer: Medicaid Other | Admitting: "Endocrinology

## 2016-06-03 ENCOUNTER — Encounter: Payer: Self-pay | Admitting: "Endocrinology

## 2016-06-08 ENCOUNTER — Telehealth: Payer: Self-pay | Admitting: Orthopaedic Surgery

## 2016-06-08 MED ORDER — HYDROCODONE-ACETAMINOPHEN 7.5-325 MG PO TABS: 1.0000 | ORAL_TABLET | ORAL | Status: DC | PRN

## 2016-06-08 NOTE — Telephone Encounter (Signed)
Rx Done . 

## 2016-06-08 NOTE — Telephone Encounter (Signed)
Patient called and requested a refill on Hydrocodone-Acetaminophen 7.5-325 mgs.  Qty 120           Sig: Take 1 tablet by mouth every 4 (four) hours as needed for moderate pain (Must last 30 days. Do not drive or operate machinery while taking this medicine.).

## 2016-07-06 ENCOUNTER — Telehealth: Payer: Self-pay | Admitting: Orthopaedic Surgery

## 2016-07-06 MED ORDER — HYDROCODONE-ACETAMINOPHEN 7.5-325 MG PO TABS: 1.0000 | ORAL_TABLET | ORAL | Status: DC | PRN

## 2016-07-06 NOTE — Telephone Encounter (Signed)
Hydrocodone-Acetaminophen 7.5/325mg Qty 120 Tablets °

## 2016-07-06 NOTE — Telephone Encounter (Signed)
Rx done. 

## 2016-07-22 ENCOUNTER — Ambulatory Visit (INDEPENDENT_AMBULATORY_CARE_PROVIDER_SITE_OTHER): Payer: Medicaid Other | Admitting: Orthopaedic Surgery

## 2016-07-22 ENCOUNTER — Encounter: Payer: Self-pay | Admitting: Orthopaedic Surgery

## 2016-07-22 VITALS — BP 127/86 | HR 82 | Ht 64.0 in | Wt 187.0 lb

## 2016-07-22 DIAGNOSIS — M4806 Spinal stenosis, lumbar region: Secondary | ICD-10-CM | POA: Diagnosis not present

## 2016-07-22 DIAGNOSIS — M48061 Spinal stenosis, lumbar region without neurogenic claudication: Secondary | ICD-10-CM

## 2016-07-22 DIAGNOSIS — M25561 Pain in right knee: Secondary | ICD-10-CM

## 2016-07-22 DIAGNOSIS — I10 Essential (primary) hypertension: Secondary | ICD-10-CM

## 2016-07-22 NOTE — Progress Notes (Signed)
Patient [id number] Christine Harvey, female DOB:01-Jun-1956, 60 y.o. [id number]  Chief Complaint  Patient presents with  . Follow-up    right knee    HPI  Christine Harvey is a 60 y.o. female who has chronic lower back pain and right knee pain.  She is stable. She has no new trauma. She has no paresthesia or weakness.  The knee has some swelling at times but does not give way or lock. HPI  Body mass index is 32.1 kg/m.  ROS  Review of Systems  HENT: Negative for congestion.   Eyes: Negative.   Respiratory: Negative for cough and shortness of breath.   Cardiovascular: Negative for chest pain and leg swelling.       Hypertension.  Endocrine:       Diabetes mellitus on insulin  Musculoskeletal: Positive for arthralgias, gait problem and joint swelling.  Neurological: Positive for headaches.       Post benign brain tumor, no residual except slight weakness of the left lower leg at times, varies.    Past Medical History:  Diagnosis Date  . Arthritis    osteoarthritis  . 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  some iInvolvement long the Clival bone and Effect upon the Pons  . Depression   . Diabetes mellitus   . Hypertension   . Photophobia    With Headaches  . S/P radiation therapy 05/02/13   25 Gy at 5 Gy per fraction, last treatment on 04/30/13  . Weakness of left leg   . Worsening headaches     Past Surgical History:  Procedure Laterality Date  . TONSILLECTOMY     As a child    Family History  Problem Relation Age of Onset  . Hypertension Mother   . CAD Mother   . Hypertension Father   . Stroke Father   . Cancer Sister     Social History Social History  Substance Use Topics  . Smoking status: Current Some Day Smoker  . Smokeless tobacco: Not on file  . Alcohol use Yes     Comment: 2010 -  Alcohol Rehabilitation/ Drinks Occassionally    Allergies  Allergen Reactions  . Sulfa Antibiotics Other (See  Comments)    Passes out    Current Outpatient Prescriptions  Medication Sig Dispense Refill  . acyclovir (ZOVIRAX) 200 MG capsule Take 200 mg by mouth as needed.    Marland Kitchen atenolol (TENORMIN) 25 MG tablet Take 50 mg by mouth daily.     . Blood Glucose Monitoring Suppl (ACCU-CHEK AVIVA) device Use as instructed 1 each 0  . glucose blood (ACCU-CHEK AVIVA) test strip Test glucose 4 times a day E11.65 150 each 3  . HYDROcodone-acetaminophen (NORCO) 7.5-325 MG tablet Take 1 tablet by mouth every 4 (four) hours as needed for moderate pain (Must last 30 days.  Do not drive or operate machinery while taking this medicine.). 120 tablet 0  . Insulin Glargine (LANTUS SOLOSTAR) 100 UNIT/ML Solostar Pen Inject 50 Units into the skin at bedtime.    Marland Kitchen lisinopril (PRINIVIL,ZESTRIL) 40 MG tablet Take 40 mg by mouth daily.    Marland Kitchen losartan-hydrochlorothiazide (HYZAAR) 100-25 MG per tablet Take 1 tablet by mouth daily.    . metFORMIN (GLUCOPHAGE) 500 MG tablet Take 500 mg by mouth 2 (two) times daily with a meal.    . naproxen (NAPROSYN) 500 MG tablet Take 500 mg by mouth 2 (two)  times daily with a meal.    . potassium chloride (K-DUR) 10 MEQ tablet Take 10 mEq by mouth daily.     No current facility-administered medications for this visit.      Physical Exam  Blood pressure 127/86, pulse 82, height 5\' 4"  (1.626 m), weight 187 lb (84.8 kg).  Constitutional: overall normal hygiene, normal nutrition, well developed, normal grooming, normal body habitus. Assistive device:none  Musculoskeletal: gait and station Limp none, muscle tone and strength are normal, no tremors or atrophy is present.  .  Neurological: coordination overall normal.  Deep tendon reflex/nerve stretch intact.  Sensation normal.  Cranial nerves II-XII intact.   Skin:   normal overall no scars, lesions, ulcers or rashes. No psoriasis.  Psychiatric: Alert and oriented x 3.  Recent memory intact, remote memory unclear.  Normal mood and affect.  Well groomed.  Good eye contact.  Cardiovascular: overall no swelling, no varicosities, no edema bilaterally, normal temperatures of the legs and arms, no clubbing, cyanosis and good capillary refill.  Lymphatic: palpation is normal.  Spine/Pelvis examination:  Inspection:  Overall, sacoiliac joint benign and hips nontender; without crepitus or defects.   Thoracic spine inspection: Alignment normal without kyphosis present   Lumbar spine inspection:  Alignment  with normal lumbar lordosis, without scoliosis apparent.   Thoracic spine palpation:  without tenderness of spinal processes   Lumbar spine palpation: with tenderness of lumbar area; without tightness of lumbar muscles    Range of Motion:   Lumbar flexion, forward flexion is 45 without pain or tenderness    Lumbar extension is 10 without pain or tenderness   Left lateral bend is Normal  without pain or tenderness   Right lateral bend is Normal without pain or tenderness   Straight leg raising is Normal   Strength & tone: Normal   Stability overall normal stability   The right lower extremity is examined:  Inspection:  Thigh:  Non-tender and no defects  Knee does not have swelling 0 effusion.                        Joint tenderness is present                        Patient is tender over the medial joint line  Lower Leg:  Has normal appearance and no tenderness or defects  Ankle:  Non-tender and no defects  Foot:  Non-tender and no defects Range of Motion:  Knee:  Range of motion is: 0-115                        Crepitus is  present  Ankle:  Range of motion is normal. Strength and Tone:  The right lower extremity has normal strength and tone. Stability:  Knee:  The knee is stable.  Ankle:  The ankle is stable.    The patient has been educated about the nature of the problem(s) and counseled on treatment options.  The patient appeared to understand what I have discussed and is in agreement with it.  Encounter  Diagnoses  Name Primary?  . Lumbar canal stenosis Yes  . Right knee pain   . Essential hypertension, benign     PLAN Call if any problems.  Precautions discussed.  Continue current medications.   Return to clinic 3 months  Electronically Signed Sanjuana Kava, MD 8/3/20171:38 PM

## 2016-07-31 ENCOUNTER — Encounter (HOSPITAL_COMMUNITY): Payer: Self-pay | Admitting: Nurse Practitioner

## 2016-07-31 ENCOUNTER — Inpatient Hospital Stay (HOSPITAL_COMMUNITY)
Admission: EM | Admit: 2016-07-31 | Discharge: 2016-08-03 | DRG: 282 | Disposition: A | Discharge status: Home / Self Care | Payer: Medicaid Other | Source: Ambulatory Visit | Attending: Cardiovascular Disease | Admitting: Cardiovascular Disease

## 2016-07-31 ENCOUNTER — Encounter (HOSPITAL_COMMUNITY)
Admission: EM | Disposition: A | Discharge status: Home / Self Care | Payer: Self-pay | Source: Ambulatory Visit | Attending: Cardiovascular Disease

## 2016-07-31 DIAGNOSIS — E663 Overweight: Secondary | ICD-10-CM | POA: Diagnosis present

## 2016-07-31 DIAGNOSIS — I2129 ST elevation (STEMI) myocardial infarction involving other sites: Secondary | ICD-10-CM | POA: Diagnosis not present

## 2016-07-31 DIAGNOSIS — Z79899 Other long term (current) drug therapy: Secondary | ICD-10-CM

## 2016-07-31 DIAGNOSIS — E118 Type 2 diabetes mellitus with unspecified complications: Secondary | ICD-10-CM | POA: Diagnosis present

## 2016-07-31 DIAGNOSIS — Z923 Personal history of irradiation: Secondary | ICD-10-CM

## 2016-07-31 DIAGNOSIS — Z6831 Body mass index (BMI) 31.0-31.9, adult: Secondary | ICD-10-CM

## 2016-07-31 DIAGNOSIS — Z86011 Personal history of benign neoplasm of the brain: Secondary | ICD-10-CM

## 2016-07-31 DIAGNOSIS — R079 Chest pain, unspecified: Secondary | ICD-10-CM | POA: Diagnosis present

## 2016-07-31 DIAGNOSIS — E876 Hypokalemia: Secondary | ICD-10-CM | POA: Diagnosis not present

## 2016-07-31 DIAGNOSIS — Z791 Long term (current) use of non-steroidal anti-inflammatories (NSAID): Secondary | ICD-10-CM

## 2016-07-31 DIAGNOSIS — E785 Hyperlipidemia, unspecified: Secondary | ICD-10-CM | POA: Diagnosis present

## 2016-07-31 DIAGNOSIS — F1410 Cocaine abuse, uncomplicated: Secondary | ICD-10-CM | POA: Diagnosis present

## 2016-07-31 DIAGNOSIS — F17200 Nicotine dependence, unspecified, uncomplicated: Secondary | ICD-10-CM | POA: Diagnosis present

## 2016-07-31 DIAGNOSIS — I2119 ST elevation (STEMI) myocardial infarction involving other coronary artery of inferior wall: Secondary | ICD-10-CM | POA: Diagnosis present

## 2016-07-31 DIAGNOSIS — I2510 Atherosclerotic heart disease of native coronary artery without angina pectoris: Secondary | ICD-10-CM | POA: Diagnosis not present

## 2016-07-31 DIAGNOSIS — Z794 Long term (current) use of insulin: Secondary | ICD-10-CM

## 2016-07-31 DIAGNOSIS — I10 Essential (primary) hypertension: Secondary | ICD-10-CM | POA: Diagnosis not present

## 2016-07-31 DIAGNOSIS — I213 ST elevation (STEMI) myocardial infarction of unspecified site: Secondary | ICD-10-CM | POA: Diagnosis present

## 2016-07-31 DIAGNOSIS — Z720 Tobacco use: Secondary | ICD-10-CM | POA: Diagnosis not present

## 2016-07-31 DIAGNOSIS — I119 Hypertensive heart disease without heart failure: Secondary | ICD-10-CM | POA: Diagnosis present

## 2016-07-31 DIAGNOSIS — Z9119 Patient's noncompliance with other medical treatment and regimen: Secondary | ICD-10-CM | POA: Diagnosis not present

## 2016-07-31 HISTORY — DX: Morbid (severe) obesity due to excess calories: E66.01

## 2016-07-31 HISTORY — PX: CARDIAC CATHETERIZATION: SHX172

## 2016-07-31 HISTORY — DX: Hyperlipidemia, unspecified: E78.5

## 2016-07-31 LAB — COMPREHENSIVE METABOLIC PANEL
ALT: 29 U/L (ref 14–54)
AST: 42 U/L — ABNORMAL HIGH (ref 15–41)
Albumin: 3.2 g/dL — ABNORMAL LOW (ref 3.5–5.0)
Alkaline Phosphatase: 81 U/L (ref 38–126)
Anion gap: 12 (ref 5–15)
BUN: 9 mg/dL (ref 6–20)
CO2: 23 mmol/L (ref 22–32)
Calcium: 8.9 mg/dL (ref 8.9–10.3)
Chloride: 100 mmol/L — ABNORMAL LOW (ref 101–111)
Creatinine, Ser: 0.98 mg/dL (ref 0.44–1.00)
GFR calc Af Amer: >60 mL/min (ref >60)
GFR calc non Af Amer: >60 mL/min (ref >60)
Glucose, Bld: 190 mg/dL — ABNORMAL HIGH (ref 65–99)
Potassium: 3 mmol/L — ABNORMAL LOW (ref 3.5–5.1)
Sodium: 135 mmol/L (ref 135–145)
Total Bilirubin: 0.7 mg/dL (ref 0.3–1.2)
Total Protein: 8 g/dL (ref 6.5–8.1)

## 2016-07-31 LAB — CBC
HCT: 36 % (ref 36.0–46.0)
Hemoglobin: 11.7 g/dL — ABNORMAL LOW (ref 12.0–15.0)
MCH: 28.5 pg (ref 26.0–34.0)
MCHC: 32.5 g/dL (ref 30.0–36.0)
MCV: 87.8 fL (ref 78.0–100.0)
Platelets: 352 10*3/uL (ref 150–400)
RBC: 4.1 MIL/uL (ref 3.87–5.11)
RDW: 13.5 % (ref 11.5–15.5)
WBC: 10 10*3/uL (ref 4.0–10.5)

## 2016-07-31 LAB — POCT I-STAT, CHEM 8
BUN: 9 mg/dL (ref 6–20)
Calcium, Ion: 1.18 mmol/L (ref 1.12–1.23)
Chloride: 99 mmol/L — ABNORMAL LOW (ref 101–111)
Creatinine, Ser: 0.8 mg/dL (ref 0.44–1.00)
Glucose, Bld: 191 mg/dL — ABNORMAL HIGH (ref 65–99)
HCT: 38 % (ref 36.0–46.0)
Hemoglobin: 12.9 g/dL (ref 12.0–15.0)
Potassium: 3 mmol/L — ABNORMAL LOW (ref 3.5–5.1)
Sodium: 140 mmol/L (ref 135–145)
TCO2: 25 mmol/L (ref 0–100)

## 2016-07-31 LAB — GLUCOSE, CAPILLARY
Glucose-Capillary: 155 mg/dL — ABNORMAL HIGH (ref 65–99)
Glucose-Capillary: 303 mg/dL — ABNORMAL HIGH (ref 65–99)
Glucose-Capillary: 196 mg/dL — ABNORMAL HIGH (ref 65–99)

## 2016-07-31 LAB — CK TOTAL AND CKMB (NOT AT ARMC)
CK, MB: 2.7 ng/mL (ref 0.5–5.0)
Relative Index: INVALID (ref 0.0–2.5)
Total CK: 93 U/L (ref 38–234)

## 2016-07-31 LAB — APTT: aPTT: 29 seconds (ref 24–36)

## 2016-07-31 LAB — PROTIME-INR
INR: 1.11
Prothrombin Time: 14.3 seconds (ref 11.4–15.2)

## 2016-07-31 LAB — TROPONIN I: Troponin I: 5.15 ng/mL (ref <0.03)

## 2016-07-31 LAB — LIPID PANEL
Cholesterol: 116 mg/dL (ref 0–200)
HDL: 28 mg/dL — ABNORMAL LOW (ref >40)
LDL Cholesterol: 60 mg/dL (ref 0–99)
Total CHOL/HDL Ratio: 4.1 RATIO
Triglycerides: 141 mg/dL (ref <150)
VLDL: 28 mg/dL (ref 0–40)

## 2016-07-31 LAB — POCT ACTIVATED CLOTTING TIME: Activated Clotting Time: 368 seconds

## 2016-07-31 LAB — RAPID URINE DRUG SCREEN, HOSP PERFORMED
Amphetamines: NOT DETECTED
Barbiturates: NOT DETECTED
Benzodiazepines: POSITIVE — AB
Cocaine: POSITIVE — AB
Opiates: POSITIVE — AB
Tetrahydrocannabinol: NOT DETECTED

## 2016-07-31 LAB — MRSA PCR SCREENING: MRSA by PCR: NEGATIVE

## 2016-07-31 SURGERY — Surgical Case
Anesthesia: *Unknown

## 2016-07-31 SURGERY — LEFT HEART CATH AND CORONARY ANGIOGRAPHY
Anesthesia: LOCAL

## 2016-07-31 MED ORDER — HYDROCHLOROTHIAZIDE 25 MG PO TABS: 25.0000 mg | ORAL_TABLET | Freq: Every day | ORAL | Status: DC

## 2016-07-31 MED ORDER — LOSARTAN POTASSIUM 50 MG PO TABS
100.0000 mg | ORAL_TABLET | Freq: Every day | ORAL | Status: DC
  Administered 2016-08-01 – 2016-08-03 (×3): 100 mg via ORAL
  Filled 2016-07-31 (×3): qty 2

## 2016-07-31 MED ORDER — MIDAZOLAM HCL 2 MG/2ML IJ SOLN
INTRAMUSCULAR | Status: AC
  Filled 2016-07-31: qty 2

## 2016-07-31 MED ORDER — NITROGLYCERIN 1 MG/10 ML FOR IR/CATH LAB
INTRA_ARTERIAL | Status: AC
  Filled 2016-07-31: qty 10

## 2016-07-31 MED ORDER — IOPAMIDOL (ISOVUE-370) INJECTION 76%
INTRAVENOUS | Status: DC | PRN
  Administered 2016-07-31: 190 mL via INTRA_ARTERIAL

## 2016-07-31 MED ORDER — IOPAMIDOL (ISOVUE-370) INJECTION 76%
INTRAVENOUS | Status: AC
  Filled 2016-07-31: qty 125

## 2016-07-31 MED ORDER — FENTANYL CITRATE (PF) 100 MCG/2ML IJ SOLN
INTRAMUSCULAR | Status: DC | PRN
  Administered 2016-07-31: 25 ug via INTRAVENOUS

## 2016-07-31 MED ORDER — INSULIN GLARGINE 100 UNIT/ML ~~LOC~~ SOLN
50.0000 [IU] | Freq: Every day | SUBCUTANEOUS | Status: DC
  Administered 2016-07-31 – 2016-08-02 (×3): 50 [IU] via SUBCUTANEOUS
  Filled 2016-07-31 (×4): qty 0.5

## 2016-07-31 MED ORDER — VERAPAMIL HCL 2.5 MG/ML IV SOLN
INTRAVENOUS | Status: AC
  Filled 2016-07-31: qty 2

## 2016-07-31 MED ORDER — HEPARIN (PORCINE) IN NACL 2-0.9 UNIT/ML-% IJ SOLN
INTRAMUSCULAR | Status: AC
  Filled 2016-07-31: qty 1000

## 2016-07-31 MED ORDER — HYDRALAZINE HCL 20 MG/ML IJ SOLN
10.0000 mg | INTRAMUSCULAR | Status: DC | PRN
Start: 2016-07-31 — End: 2016-08-03

## 2016-07-31 MED ORDER — POTASSIUM CHLORIDE CRYS ER 20 MEQ PO TBCR
40.0000 meq | EXTENDED_RELEASE_TABLET | Freq: Once | ORAL | Status: AC
Start: 2016-07-31 — End: 2016-07-31
  Administered 2016-07-31: 40 meq via ORAL
  Filled 2016-07-31: qty 2

## 2016-07-31 MED ORDER — FENTANYL CITRATE (PF) 100 MCG/2ML IJ SOLN
INTRAMUSCULAR | Status: AC
  Filled 2016-07-31: qty 2

## 2016-07-31 MED ORDER — ATROPINE SULFATE 1 MG/10ML IJ SOSY
PREFILLED_SYRINGE | INTRAMUSCULAR | Status: AC
  Filled 2016-07-31: qty 10

## 2016-07-31 MED ORDER — MIDAZOLAM HCL 2 MG/2ML IJ SOLN
INTRAMUSCULAR | Status: DC | PRN
  Administered 2016-07-31: 1 mg via INTRAVENOUS

## 2016-07-31 MED ORDER — HEPARIN (PORCINE) IN NACL 2-0.9 UNIT/ML-% IJ SOLN
INTRAMUSCULAR | Status: DC | PRN
  Administered 2016-07-31: 1000 mL
  Administered 2016-07-31: 500 mL

## 2016-07-31 MED ORDER — CLOPIDOGREL BISULFATE 300 MG PO TABS
ORAL_TABLET | ORAL | Status: AC
  Filled 2016-07-31: qty 1

## 2016-07-31 MED ORDER — ACETAMINOPHEN 325 MG PO TABS: 650.0000 mg | ORAL_TABLET | ORAL | Status: DC | PRN

## 2016-07-31 MED ORDER — NITROGLYCERIN 0.4 MG SL SUBL: 0.4000 mg | SUBLINGUAL_TABLET | SUBLINGUAL | Status: DC | PRN

## 2016-07-31 MED ORDER — MORPHINE SULFATE (PF) 2 MG/ML IV SOLN: 2.0000 mg | INTRAVENOUS | Status: DC | PRN

## 2016-07-31 MED ORDER — SODIUM CHLORIDE 0.9 % IV SOLN: 250.0000 mL | INTRAVENOUS | Status: DC | PRN

## 2016-07-31 MED ORDER — BIVALIRUDIN BOLUS VIA INFUSION - CUPID
INTRAVENOUS | Status: DC | PRN
  Administered 2016-07-31: 63.6 mg via INTRAVENOUS

## 2016-07-31 MED ORDER — INSULIN ASPART 100 UNIT/ML ~~LOC~~ SOLN
0.0000 [IU] | Freq: Three times a day (TID) | SUBCUTANEOUS | Status: DC
  Administered 2016-07-31: 11 [IU] via SUBCUTANEOUS
  Administered 2016-08-01: 8 [IU] via SUBCUTANEOUS
  Administered 2016-08-01: 2 [IU] via SUBCUTANEOUS
  Administered 2016-08-01: 3 [IU] via SUBCUTANEOUS
  Administered 2016-08-02: 5 [IU] via SUBCUTANEOUS
  Administered 2016-08-02 – 2016-08-03 (×3): 2 [IU] via SUBCUTANEOUS

## 2016-07-31 MED ORDER — ASPIRIN 81 MG PO CHEW
81.0000 mg | CHEWABLE_TABLET | Freq: Every day | ORAL | Status: DC
  Administered 2016-08-01: 81 mg via ORAL
  Filled 2016-07-31: qty 1

## 2016-07-31 MED ORDER — ONDANSETRON HCL 4 MG/2ML IJ SOLN: 4.0000 mg | Freq: Four times a day (QID) | INTRAMUSCULAR | Status: DC | PRN

## 2016-07-31 MED ORDER — HYDROCODONE-ACETAMINOPHEN 7.5-325 MG PO TABS: 1.0000 | ORAL_TABLET | ORAL | Status: DC | PRN

## 2016-07-31 MED ORDER — HEPARIN (PORCINE) IN NACL 100-0.45 UNIT/ML-% IJ SOLN: 1100.0000 [IU]/h | INTRAMUSCULAR | Status: DC

## 2016-07-31 MED ORDER — SODIUM CHLORIDE 0.9 % IV SOLN
INTRAVENOUS | Status: DC | PRN
  Administered 2016-07-31: 10 mL/h via INTRAVENOUS

## 2016-07-31 MED ORDER — HEPARIN SODIUM (PORCINE) 5000 UNIT/ML IJ SOLN: 5000.0000 [IU] | Freq: Three times a day (TID) | INTRAMUSCULAR | Status: DC

## 2016-07-31 MED ORDER — ATORVASTATIN CALCIUM 80 MG PO TABS
80.0000 mg | ORAL_TABLET | Freq: Every day | ORAL | Status: DC
  Administered 2016-07-31 – 2016-08-02 (×3): 80 mg via ORAL
  Filled 2016-07-31 (×3): qty 1

## 2016-07-31 MED ORDER — HEPARIN (PORCINE) IN NACL 100-0.45 UNIT/ML-% IJ SOLN
1250.0000 [IU]/h | INTRAMUSCULAR | Status: DC
  Administered 2016-07-31: 1100 [IU]/h via INTRAVENOUS
  Administered 2016-08-01: 1250 [IU]/h via INTRAVENOUS
  Filled 2016-07-31 (×2): qty 250

## 2016-07-31 MED ORDER — POTASSIUM CHLORIDE ER 10 MEQ PO TBCR
10.0000 meq | EXTENDED_RELEASE_TABLET | Freq: Every day | ORAL | Status: DC
  Administered 2016-08-01 – 2016-08-02 (×2): 10 meq via ORAL
  Filled 2016-07-31 (×6): qty 1

## 2016-07-31 MED ORDER — BIVALIRUDIN 250 MG IV SOLR
INTRAVENOUS | Status: AC
  Filled 2016-07-31: qty 250

## 2016-07-31 MED ORDER — SODIUM CHLORIDE 0.9 % WEIGHT BASED INFUSION
1.0000 mL/kg/h | INTRAVENOUS | Status: AC
  Administered 2016-07-31: 1 mL/kg/h via INTRAVENOUS

## 2016-07-31 MED ORDER — CLOPIDOGREL BISULFATE 75 MG PO TABS
75.0000 mg | ORAL_TABLET | Freq: Every day | ORAL | Status: DC
  Administered 2016-08-01 – 2016-08-03 (×3): 75 mg via ORAL
  Filled 2016-07-31 (×3): qty 1

## 2016-07-31 MED ORDER — SODIUM CHLORIDE 0.9% FLUSH
3.0000 mL | Freq: Two times a day (BID) | INTRAVENOUS | Status: DC
Start: 2016-07-31 — End: 2016-08-03
  Administered 2016-07-31 – 2016-08-02 (×4): 3 mL via INTRAVENOUS

## 2016-07-31 MED ORDER — SODIUM CHLORIDE 0.9% FLUSH: 3.0000 mL | INTRAVENOUS | Status: DC | PRN

## 2016-07-31 MED ORDER — LIDOCAINE HCL (PF) 1 % IJ SOLN
INTRAMUSCULAR | Status: DC | PRN
  Administered 2016-07-31: 25 mL

## 2016-07-31 MED ORDER — IOPAMIDOL (ISOVUE-370) INJECTION 76%
INTRAVENOUS | Status: AC
  Filled 2016-07-31: qty 100

## 2016-07-31 MED ORDER — ASPIRIN EC 81 MG PO TBEC
81.0000 mg | DELAYED_RELEASE_TABLET | Freq: Every day | ORAL | Status: DC
  Administered 2016-08-01 – 2016-08-03 (×3): 81 mg via ORAL
  Filled 2016-07-31 (×3): qty 1

## 2016-07-31 MED ORDER — CLOPIDOGREL BISULFATE 300 MG PO TABS
ORAL_TABLET | ORAL | Status: DC | PRN
Start: 2016-07-31 — End: 2016-07-31
  Administered 2016-07-31: 600 mg via ORAL

## 2016-07-31 MED ORDER — IOPAMIDOL (ISOVUE-370) INJECTION 76%
INTRAVENOUS | Status: AC
  Filled 2016-07-31: qty 50

## 2016-07-31 MED ORDER — SODIUM CHLORIDE 0.9% FLUSH
3.0000 mL | Freq: Two times a day (BID) | INTRAVENOUS | Status: DC
  Administered 2016-07-31 – 2016-08-03 (×4): 3 mL via INTRAVENOUS

## 2016-07-31 MED ORDER — LOSARTAN POTASSIUM-HCTZ 100-25 MG PO TABS: 1.0000 | ORAL_TABLET | Freq: Every day | ORAL | Status: DC

## 2016-07-31 MED ORDER — SODIUM CHLORIDE 0.9 % IV SOLN
INTRAVENOUS | Status: DC | PRN
  Administered 2016-07-31: 1.75 mg/kg/h via INTRAVENOUS

## 2016-07-31 MED ORDER — ATENOLOL 50 MG PO TABS: 50.0000 mg | ORAL_TABLET | Freq: Every day | ORAL | Status: DC

## 2016-07-31 SURGICAL SUPPLY — 22 items
BALLN EMERGE MR 2.0X12 (BALLOONS) ×4
BALLN EMERGE MR PUSH 1.5X8 (BALLOONS) ×2
BALLOON EMERGE MR 2.0X12 (BALLOONS) ×2 IMPLANT
BALLOON EMERGE MR PUSH 1.5X8 (BALLOONS) ×1 IMPLANT
CATH INFINITI 5FR MULTPACK ANG (CATHETERS) ×2 IMPLANT
CATH VISTA GUIDE 6FR XBLAD3.5 (CATHETERS) ×2 IMPLANT
DEVICE RAD COMP TR BAND LRG (VASCULAR PRODUCTS) IMPLANT
KIT ENCORE 26 ADVANTAGE (KITS) ×2 IMPLANT
KIT HEART LEFT (KITS) ×2 IMPLANT
PACK CARDIAC CATHETERIZATION (CUSTOM PROCEDURE TRAY) ×2 IMPLANT
SHEATH PINNACLE 6F 10CM (SHEATH) ×2 IMPLANT
STOPCOCK MORSE 400PSI 3WAY (MISCELLANEOUS) ×2 IMPLANT
SYR MEDRAD MARK V 150ML (SYRINGE) ×2 IMPLANT
TRANSDUCER W/STOPCOCK (MISCELLANEOUS) ×2 IMPLANT
TUBING CIL FLEX 10 FLL-RA (TUBING) ×2 IMPLANT
WIRE ASAHI FIELDER XT 190CM (WIRE) ×2 IMPLANT
WIRE ASAHI PROWATER 180CM (WIRE) ×2 IMPLANT
WIRE COUGAR XT STRL 190CM (WIRE) ×2 IMPLANT
WIRE EMERALD 3MM-J .035X150CM (WIRE) ×2 IMPLANT
WIRE HI TORQ BMW 190CM (WIRE) ×2 IMPLANT
WIRE HI TORQ WHISPER MS 190CM (WIRE) ×2 IMPLANT
WIRE RUNTHROUGH .014X180CM (WIRE) ×2 IMPLANT

## 2016-07-31 NOTE — Progress Notes (Signed)
   07/31/16 1214  Clinical Encounter Type  Visited With Patient not available  Visit Type Initial;ED   Chaplain responded to a code STEMI. Patient was taken immediately to the Cath Lab, and no family came through with EMS. Spiritual care services available as needed.   Jeri Lager, Chaplain 07/31/16 12:14 PM

## 2016-07-31 NOTE — H&P (Signed)
History & Physical    Patient ID: Christine Harvey MRN: [id number], DOB/AGE: Jul 06, 1956   Admit date: 07/31/2016   Primary Physician: Celedonio Savage, MD Primary Cardiologist: New - seen by Adora Fridge, MD (pt to f/u in [place])  Patient Profile    60 y/o ? with a h/o HTN, HL, DM, obesity, noncompliance, tobacco abuse, and headaches in the setting of meningioma s/p stereotactic radiosurgery in 2014, who presented to the ED today with chest pain and lateral ST elevation.  Past Medical History    Past Medical History:  Diagnosis Date  . Arthritis    osteoarthritis  . 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  some iInvolvement long the Clival bone and Effect upon the Pons - s/p sterotactic radiosurgery in 2014.  . Depression   . Diabetes mellitus   . Hyperlipidemia   . Hypertension   . Morbid obesity ([place])   . Photophobia    With Headaches  . S/P radiation therapy 05/02/13   25 Gy at 5 Gy per fraction, last treatment on 04/30/13  . Tobacco abuse   . Weakness of left leg   . Worsening headaches     Past Surgical History:  Procedure Laterality Date  . TONSILLECTOMY     As a child     Allergies  Allergies  Allergen Reactions  . Sulfa Antibiotics Other (See Comments)    Passes out    History of Present Illness    60 y/o ? with the above complex PMH including HTN, HL, DM, obesity, tobacco abuse, and headaches w/ left sided wkns in the setting of meningioma s/p stereotactic radiosurgery in 2014.  She is sedentary @ home and does not ambulate much.  She was in her usoh until yesterday, when she began to experience intermittent sscp occurring on and off throughout the day.  This AM, she awoke with worsened chest pain and called EMS.  ECG showed lateral ST elevation with inf and ant ST depression.  A code STEMI was activated and she was taken directly to the [facility] Cath Lab.  She continues to c/o chest pain and cath is ongoing.  Home  Medications    Prior to Admission medications   Medication Sig Start Date End Date Taking? Authorizing Provider  acyclovir (ZOVIRAX) 200 MG capsule Take 200 mg by mouth as needed.    Historical Provider, MD  atenolol (TENORMIN) 25 MG tablet Take 50 mg by mouth daily.     Historical Provider, MD  Blood Glucose Monitoring Suppl (ACCU-CHEK AVIVA) device Use as instructed 02/18/16   Cassandria Anger, MD  glucose blood (ACCU-CHEK AVIVA) test strip Test glucose 4 times a day E11.65 02/19/16   Cassandria Anger, MD  HYDROcodone-acetaminophen (NORCO) 7.5-325 MG tablet Take 1 tablet by mouth every 4 (four) hours as needed for moderate pain (Must last 30 days.  Do not drive or operate machinery while taking this medicine.). 07/06/16   Sanjuana Kava, MD  Insulin Glargine (LANTUS SOLOSTAR) 100 UNIT/ML Solostar Pen Inject 50 Units into the skin at bedtime.    Historical Provider, MD  lisinopril (PRINIVIL,ZESTRIL) 40 MG tablet Take 40 mg by mouth daily.    Historical Provider, MD  losartan-hydrochlorothiazide (HYZAAR) 100-25 MG per tablet Take 1 tablet by mouth daily.    Historical Provider, MD  metFORMIN (GLUCOPHAGE) 500 MG tablet Take 500 mg by mouth 2 (two) times daily with a meal.  Historical Provider, MD  naproxen (NAPROSYN) 500 MG tablet Take 500 mg by mouth 2 (two) times daily with a meal.    Historical Provider, MD  potassium chloride (K-DUR) 10 MEQ tablet Take 10 mEq by mouth daily.    Historical Provider, MD    Family History    Family History  Problem Relation Age of Onset  . Hypertension Mother     MI in her 29's to 61's.  . CAD Mother   . Hypertension Father   . Stroke Father   . Cancer Sister     Social History    Social History   Social History  . Marital status: Single    Spouse name: N/A  . Number of children: N/A  . Years of education: N/A   Occupational History  . Not on file.   Social History Main Topics  . Smoking status: Current Some Day Smoker  . Smokeless  tobacco: Not on file  . Alcohol use Yes     Comment: 2010 -  Alcohol Rehabilitation/ Drinks Occassionally  . Drug use: No  . Sexual activity: Not on file   Other Topics Concern  . Not on file   Social History Narrative  . No narrative on file     Review of Systems    General:  No chills, fever, night sweats or weight changes.  Cardiovascular:  +++ chest pain, +++ dyspnea on exertion, no edema, orthopnea, palpitations, paroxysmal nocturnal dyspnea. Dermatological: No rash, lesions/masses Respiratory: No cough, +++ dyspnea Urologic: No hematuria, dysuria Abdominal:   No nausea, vomiting, diarrhea, bright red blood per rectum, melena, or hematemesis Neurologic:  No visual changes, +++ chronic L sided wkns, no changes in mental status.  Chronic headaches. All other systems reviewed and are otherwise negative except as noted above.  Physical Exam    SpO2 98 %. afebrile, 90, 20, 157/94 General: Pleasant, NAD Psych: Normal affect. Neuro: Alert and oriented X 3. Moves all extremities spontaneously. HEENT: Normal  Neck: Supple without bruits or JVD. Lungs:  Resp regular and unlabored, CTA. Heart: RRR no s3, s4, or murmurs. Abdomen: Soft, non-tender, non-distended, BS + x 4.  Extremities: No clubbing, cyanosis or edema. DP/PT/Radials 2+ and equal bilaterally.  Labs    All Labs Pending   Radiology Studies    No results found.  ECG & Cardiac Imaging    RSR, 69, 1-2 mm ST elev in I, aVL with deep inferior and anterior TWI  Assessment & Plan    1.  Acute Lateral STEMI:  Pt presented today with a two day h/o intermittent chest pain that worsened this AM.  She has been found to have lateral ST elevation with inferior and anterior reciprocal changes.  Emergent cath/pci ongoing.  Plan to admit and cycle ce.  Check echo.  Add asa, high potency statin,  blocker, and acei.  Eventual cardiac rehab.  2.  Hypertensive Heart Disease:  BP elevated currently.  F/u on home meds and adjust  as necessary.  3.  HL:  Add high potency statin therapy.  Check fasting lipids/lfts.  4.  DM II:  Cont lantus. A1c = 11.4 in 04/2016.  5.  Tob Abuse:  Will need formal cessation counseling.  6.  H/o meningioma/headaches/left sided wkns:  Follow.  Signed, Murray Hodgkins, NP 07/31/2016, 12:35 PM

## 2016-07-31 NOTE — Progress Notes (Signed)
ANTICOAGULATION CONSULT NOTE - Initial Consult  Pharmacy Consult for heparin   Indication: chest pain/ACS - unsuccessful PCI plan heparin x48hr  Allergies  Allergen Reactions  . Sulfa Antibiotics Other (See Comments)    Passes out    Patient Measurements: Height: 5\' 4"  (162.6 cm) IBW/kg (Calculated) : 54.7   Vital Signs: Temp: 97.7 F (36.5 C) (08/12 1400) Temp Source: Oral (08/12 1400) BP: 170/80 (08/12 1400) Pulse Rate: 74 (08/12 1400)  Labs:  Recent Labs  07/31/16 1225 07/31/16 1228  HGB 11.7* 12.9  HCT 36.0 38.0  PLT 352  --   APTT 29  --   LABPROT 14.3  --   INR 1.11  --   CREATININE 0.98 0.80  CKTOTAL 93  --   CKMB 2.7  --     Estimated Creatinine Clearance: 78.7 mL/min (by C-G formula based on SCr of 0.8 mg/dL).   Medical History: Past Medical History:  Diagnosis Date  . Arthritis    osteoarthritis  . 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  some iInvolvement long the Clival bone and Effect upon the Pons - s/p sterotactic radiosurgery in 2014.  . Depression   . Diabetes mellitus   . Hyperlipidemia   . Hypertension   . Morbid obesity ([place])   . Photophobia    With Headaches  . S/P radiation therapy 05/02/13   25 Gy at 5 Gy per fraction, last treatment on 04/30/13  . Tobacco abuse   . Weakness of left leg   . Worsening headaches      Assessment: 59yof admitted with CP - called STEMI CP improved in cath lab.  Unsuccessful PCI of diagonal plan medical therapy Clop, asa, statin - no BB with recent cocaine, losartan, HCTZ,  Begin heparin drip 4hr after sheath out x48hr  Goal of Therapy:   Heparin level 0.3-0.7 units/ml Monitor platelets by anticoagulation protocol: Yes   Plan:  4hr after sheath out - out 1330 start 1730 Heparin no bolus drip rate 1100 uts/hr 6hr HL Daily CBC, HL  Bonnita Nasuti Pharm.D. CPP, BCPS Clinical Pharmacist [phone] 07/31/2016 4:40 PM

## 2016-07-31 NOTE — Progress Notes (Signed)
Pt sheath removed at 1807. Pressure applied to right femoral site. Pressure maintained for 20 minutes. VSS. Hemostasis achieved at 1827 and pressure released. Patient education completed. Patient expressed verbal understanding that she needed to maintain right leg in straight position for the next 6 hours and need to apply pressure to femoral site with increases in abdominothoracic pressure. All questions answered. Will continue to monitor.

## 2016-08-01 ENCOUNTER — Encounter (HOSPITAL_COMMUNITY): Payer: Self-pay | Admitting: *Deleted

## 2016-08-01 ENCOUNTER — Inpatient Hospital Stay (HOSPITAL_COMMUNITY): Payer: Medicaid Other

## 2016-08-01 DIAGNOSIS — I110 Hypertensive heart disease with heart failure: Secondary | ICD-10-CM

## 2016-08-01 DIAGNOSIS — Z720 Tobacco use: Secondary | ICD-10-CM

## 2016-08-01 DIAGNOSIS — F1410 Cocaine abuse, uncomplicated: Secondary | ICD-10-CM

## 2016-08-01 DIAGNOSIS — E785 Hyperlipidemia, unspecified: Secondary | ICD-10-CM

## 2016-08-01 DIAGNOSIS — I213 ST elevation (STEMI) myocardial infarction of unspecified site: Secondary | ICD-10-CM

## 2016-08-01 LAB — CBC
HCT: 37.8 % (ref 36.0–46.0)
Hemoglobin: 11.9 g/dL — ABNORMAL LOW (ref 12.0–15.0)
MCH: 27.9 pg (ref 26.0–34.0)
MCHC: 31.5 g/dL (ref 30.0–36.0)
MCV: 88.7 fL (ref 78.0–100.0)
Platelets: 371 10*3/uL (ref 150–400)
RBC: 4.26 MIL/uL (ref 3.87–5.11)
RDW: 13.9 % (ref 11.5–15.5)
WBC: 9.9 10*3/uL (ref 4.0–10.5)

## 2016-08-01 LAB — ECHOCARDIOGRAM COMPLETE
AO mean calculated velocity dopler: 158 cm/s
AV Area VTI index: 1.01 cm2/m2
AV Area VTI: 1.71 cm2
AV Area mean vel: 1.53 cm2
AV Mean grad: 12 mmHg
AV Peak grad: 20 mmHg
AV VEL mean LVOT/AV: 0.44
AV area mean vel ind: 0.81 cm2/m2
AV peak Index: 0.9
AV pk vel: 224 cm/s
AV vel: 1.91
Ao pk vel: 0.5 m/s
E decel time: 246 msec
E/e' ratio: 17.76
FS: 24 % — AB (ref 28–44)
Height: 64 in
IVS/LV PW RATIO, ED: 0.94
LA ID, A-P, ES: 34 mm
LA diam end sys: 34 mm
LA diam index: 1.8 cm/m2
LA vol A4C: 58.1 ml
LA vol index: 35.5 mL/m2
LA vol: 67.1 mL
LV E/e' medial: 17.76
LV E/e'average: 17.76
LV PW d: 9.75 mm — AB (ref 0.6–1.1)
LV e' LATERAL: 5 cm/s
LVOT SV: 93 mL
LVOT VTI: 26.9 cm
LVOT area: 3.46 cm2
LVOT diameter: 21 mm
LVOT peak VTI: 0.55 cm
LVOT peak vel: 111 cm/s
Lateral S' vel: 11.5 cm/s
MV Dec: 246
MV Peak grad: 3 mmHg
MV pk A vel: 125 m/s
MV pk E vel: 88.8 m/s
TAPSE: 17.8 mm
TDI e' lateral: 5
TDI e' medial: 4.68
VTI: 48.8 cm
Valve area index: 1.01
Valve area: 1.91 cm2
Weight: 2952.4 oz

## 2016-08-01 LAB — BASIC METABOLIC PANEL
Anion gap: 12 (ref 5–15)
BUN: 12 mg/dL (ref 6–20)
CO2: 22 mmol/L (ref 22–32)
Calcium: 8.9 mg/dL (ref 8.9–10.3)
Chloride: 100 mmol/L — ABNORMAL LOW (ref 101–111)
Creatinine, Ser: 1 mg/dL (ref 0.44–1.00)
GFR calc Af Amer: >60 mL/min (ref >60)
GFR calc non Af Amer: 60 mL/min — ABNORMAL LOW (ref >60)
Glucose, Bld: 163 mg/dL — ABNORMAL HIGH (ref 65–99)
Potassium: 3.8 mmol/L (ref 3.5–5.1)
Sodium: 134 mmol/L — ABNORMAL LOW (ref 135–145)

## 2016-08-01 LAB — HEPARIN LEVEL (UNFRACTIONATED)
Heparin Unfractionated: 0.18 IU/mL — ABNORMAL LOW (ref 0.30–0.70)
Heparin Unfractionated: 0.25 IU/mL — ABNORMAL LOW (ref 0.30–0.70)
Heparin Unfractionated: 0.32 IU/mL (ref 0.30–0.70)
Heparin Unfractionated: 0.4 IU/mL (ref 0.30–0.70)

## 2016-08-01 LAB — GLUCOSE, CAPILLARY
Glucose-Capillary: 132 mg/dL — ABNORMAL HIGH (ref 65–99)
Glucose-Capillary: 257 mg/dL — ABNORMAL HIGH (ref 65–99)
Glucose-Capillary: 179 mg/dL — ABNORMAL HIGH (ref 65–99)
Glucose-Capillary: 185 mg/dL — ABNORMAL HIGH (ref 65–99)

## 2016-08-01 LAB — COMPREHENSIVE METABOLIC PANEL
ALT: 29 U/L (ref 14–54)
AST: 83 U/L — ABNORMAL HIGH (ref 15–41)
Albumin: 3.1 g/dL — ABNORMAL LOW (ref 3.5–5.0)
Alkaline Phosphatase: 77 U/L (ref 38–126)
Anion gap: 8 (ref 5–15)
BUN: 12 mg/dL (ref 6–20)
CO2: 27 mmol/L (ref 22–32)
Calcium: 8.9 mg/dL (ref 8.9–10.3)
Chloride: 100 mmol/L — ABNORMAL LOW (ref 101–111)
Creatinine, Ser: 1.16 mg/dL — ABNORMAL HIGH (ref 0.44–1.00)
GFR calc Af Amer: 58 mL/min — ABNORMAL LOW (ref >60)
GFR calc non Af Amer: 50 mL/min — ABNORMAL LOW (ref >60)
Glucose, Bld: 180 mg/dL — ABNORMAL HIGH (ref 65–99)
Potassium: 3.3 mmol/L — ABNORMAL LOW (ref 3.5–5.1)
Sodium: 135 mmol/L (ref 135–145)
Total Bilirubin: 0.3 mg/dL (ref 0.3–1.2)
Total Protein: 8 g/dL (ref 6.5–8.1)

## 2016-08-01 LAB — LIPID PANEL
Cholesterol: 117 mg/dL (ref 0–200)
HDL: 33 mg/dL — ABNORMAL LOW (ref >40)
LDL Cholesterol: 64 mg/dL (ref 0–99)
Total CHOL/HDL Ratio: 3.5 RATIO
Triglycerides: 101 mg/dL (ref <150)
VLDL: 20 mg/dL (ref 0–40)

## 2016-08-01 LAB — TROPONIN I: Troponin I: 13.25 ng/mL (ref <0.03)

## 2016-08-01 MED ORDER — POTASSIUM CHLORIDE CRYS ER 20 MEQ PO TBCR
40.0000 meq | EXTENDED_RELEASE_TABLET | Freq: Once | ORAL | Status: AC
Start: 2016-08-01 — End: 2016-08-01
  Administered 2016-08-01: 40 meq via ORAL
  Filled 2016-08-01: qty 2

## 2016-08-01 MED ORDER — CARVEDILOL 12.5 MG PO TABS
12.5000 mg | ORAL_TABLET | Freq: Two times a day (BID) | ORAL | Status: DC
  Administered 2016-08-01 – 2016-08-02 (×3): 12.5 mg via ORAL
  Filled 2016-08-01 (×3): qty 1

## 2016-08-01 MED ORDER — HYDROCHLOROTHIAZIDE 12.5 MG PO CAPS
12.5000 mg | ORAL_CAPSULE | Freq: Every day | ORAL | Status: DC
  Administered 2016-08-01 – 2016-08-03 (×3): 12.5 mg via ORAL
  Filled 2016-08-01 (×3): qty 1

## 2016-08-01 NOTE — Progress Notes (Signed)
ANTICOAGULATION CONSULT NOTE - Follow Up Consult  Pharmacy Consult for heparin   Indication: chest pain/ACS - unsuccessful PCI plan heparin x48hr  Allergies  Allergen Reactions  . Sulfa Antibiotics Other (See Comments)    Passes out    Patient Measurements: Height: 5\' 4"  (162.6 cm) Weight: 184 lb 8.4 oz (83.7 kg) IBW/kg (Calculated) : 54.7   Vital Signs: Temp: 98 F (36.7 C) (08/13 0800) Temp Source: Oral (08/13 0800) BP: 145/75 (08/13 0800) Pulse Rate: 86 (08/13 0800)  Labs:  Recent Labs  07/31/16 1225 07/31/16 1228 07/31/16 1935 08/01/16 0105 08/01/16 0604  HGB 11.7* 12.9  --  11.9*  --   HCT 36.0 38.0  --  37.8  --   PLT 352  --   --  371  --   APTT 29  --   --   --   --   LABPROT 14.3  --   --   --   --   INR 1.11  --   --   --   --   HEPARINUNFRC  --   --   --  0.18* 0.25*  CREATININE 0.98 0.80  --  1.16* 1.00  CKTOTAL 93  --   --   --   --   CKMB 2.7  --   --   --   --   TROPONINI  --   --  5.15* 13.25*  --     Estimated Creatinine Clearance: 62.6 mL/min (by C-G formula based on SCr of 1 mg/dL).   Medical History: Past Medical History:  Diagnosis Date  . Arthritis    osteoarthritis  . 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  some iInvolvement long the Clival bone and Effect upon the Pons - s/p sterotactic radiosurgery in 2014.  . Depression   . Diabetes mellitus   . Hyperlipidemia   . Hypertension   . Morbid obesity ([place])   . Photophobia    With Headaches  . S/P radiation therapy 05/02/13   25 Gy at 5 Gy per fraction, last treatment on 04/30/13  . Tobacco abuse   . Weakness of left leg   . Worsening headaches      Assessment: 59yof admitted with CP - called STEMI CP improved in cath lab.  Unsuccessful PCI of diagonal plan medical therapy Clop, asa, statin - no BB with recent cocaine, losartan, HCTZ, BP elevated may benefit from amlodipine or Imdur Heparin started after sheath pulled last  pm Heparin drip 1100 uts/hr HL 0.25 < goal CBC stable no bleeding  Goal of Therapy:   Heparin level 0.3-0.7 units/ml Monitor platelets by anticoagulation protocol: Yes   Plan:  Increase Heparin 1250 uts/hr  6hr HL Daily CBC, HL  Bonnita Nasuti Pharm.D. CPP, BCPS Clinical Pharmacist [phone] 08/01/2016 8:43 AM

## 2016-08-01 NOTE — Progress Notes (Signed)
PATIENT ID: Christine Harvey is a 65F with hypertension, hyperlipidemia, DM, tobacco abuse,and CKD who presented with lateral STEMI. She had a 99% D1 lesion that could not be opened.   SUBJECTIVE:  Feeling well.  Denies chest pain or shortness of breath.  Admits to using cocaine two days ago.  Does not want family to know about this.    PHYSICAL EXAM Vitals:   08/01/16 0300 08/01/16 0400 08/01/16 0500 08/01/16 0800  BP: 125/64 (!) 109/52  (!) 145/75  Pulse: 88 86  86  Resp: (!) 21 (!) 26  (!) 22  Temp:  98.8 F (37.1 C)  98 F (36.7 C)  TempSrc:  Oral  Oral  SpO2: 96% 95%  98%  Weight:   184 lb 8.4 oz (83.7 kg)   Height:       General:  Well-appearing.  No acute distress Neck: No JVD Lungs:  CTAB.  No crackles, rhonchi or wheezes3 Heart:  RRR.  No m/r/g.  Normal S1/S2 Abdomen:  Soft, NT, ND. +BS  Extremities:  WWP.  No edema.   LABS: Lab Results  Component Value Date   TROPONINI 13.25 ([place]) 08/01/2016   Results for orders placed or performed during the hospital encounter of 07/31/16 (from the past 24 hour(s))  Comprehensive metabolic panel     Status: Abnormal   Collection Time: 07/31/16 12:25 PM  Result Value Ref Range   Sodium 135 135 - 145 mmol/L   Potassium 3.0 (L) 3.5 - 5.1 mmol/L   Chloride 100 (L) 101 - 111 mmol/L   CO2 23 22 - 32 mmol/L   Glucose, Bld 190 (H) 65 - 99 mg/dL   BUN 9 6 - 20 mg/dL   Creatinine, Ser 0.98 0.44 - 1.00 mg/dL   Calcium 8.9 8.9 - 10.3 mg/dL   Total Protein 8.0 6.5 - 8.1 g/dL   Albumin 3.2 (L) 3.5 - 5.0 g/dL   AST 42 (H) 15 - 41 U/L   ALT 29 14 - 54 U/L   Alkaline Phosphatase 81 38 - 126 U/L   Total Bilirubin 0.7 0.3 - 1.2 mg/dL   GFR calc non Af Amer >60 >60 mL/min   GFR calc Af Amer >60 >60 mL/min   Anion gap 12 5 - 15  Lipid panel     Status: Abnormal   Collection Time: 07/31/16 12:25 PM  Result Value Ref Range   Cholesterol 116 0 - 200 mg/dL   Triglycerides 141 <150 mg/dL   HDL 28 (L) >40 mg/dL   Total CHOL/HDL Ratio 4.1 RATIO    VLDL 28 0 - 40 mg/dL   LDL Cholesterol 60 0 - 99 mg/dL  CK total and CKMB (cardiac)not at [facility]     Status: None   Collection Time: 07/31/16 12:25 PM  Result Value Ref Range   Total CK 93 38 - 234 U/L   CK, MB 2.7 0.5 - 5.0 ng/mL   Relative Index RELATIVE INDEX IS INVALID 0.0 - 2.5  CBC     Status: Abnormal   Collection Time: 07/31/16 12:25 PM  Result Value Ref Range   WBC 10.0 4.0 - 10.5 K/uL   RBC 4.10 3.87 - 5.11 MIL/uL   Hemoglobin 11.7 (L) 12.0 - 15.0 g/dL   HCT 36.0 36.0 - 46.0 %   MCV 87.8 78.0 - 100.0 fL   MCH 28.5 26.0 - 34.0 pg   MCHC 32.5 30.0 - 36.0 g/dL   RDW 13.5 11.5 - 15.5 %   Platelets  352 150 - 400 K/uL  Protime-INR     Status: None   Collection Time: 07/31/16 12:25 PM  Result Value Ref Range   Prothrombin Time 14.3 11.4 - 15.2 seconds   INR 1.11   APTT     Status: None   Collection Time: 07/31/16 12:25 PM  Result Value Ref Range   aPTT 29 24 - 36 seconds  I-STAT, chem 8     Status: Abnormal   Collection Time: 07/31/16 12:28 PM  Result Value Ref Range   Sodium 140 135 - 145 mmol/L   Potassium 3.0 (L) 3.5 - 5.1 mmol/L   Chloride 99 (L) 101 - 111 mmol/L   BUN 9 6 - 20 mg/dL   Creatinine, Ser 0.80 0.44 - 1.00 mg/dL   Glucose, Bld 191 (H) 65 - 99 mg/dL   Calcium, Ion 1.18 1.12 - 1.23 mmol/L   TCO2 25 0 - 100 mmol/L   Hemoglobin 12.9 12.0 - 15.0 g/dL   HCT 38.0 36.0 - 46.0 %  POCT Activated clotting time     Status: None   Collection Time: 07/31/16 12:50 PM  Result Value Ref Range   Activated Clotting Time 368 seconds  MRSA PCR Screening     Status: None   Collection Time: 07/31/16  2:29 PM  Result Value Ref Range   MRSA by PCR NEGATIVE NEGATIVE  Glucose, capillary     Status: Abnormal   Collection Time: 07/31/16  2:37 PM  Result Value Ref Range   Glucose-Capillary 155 (H) 65 - 99 mg/dL  Rapid urine drug screen (hospital performed)     Status: Abnormal   Collection Time: 07/31/16  3:42 PM  Result Value Ref Range   Opiates POSITIVE (A) NONE  DETECTED   Cocaine POSITIVE (A) NONE DETECTED   Benzodiazepines POSITIVE (A) NONE DETECTED   Amphetamines NONE DETECTED NONE DETECTED   Tetrahydrocannabinol NONE DETECTED NONE DETECTED   Barbiturates NONE DETECTED NONE DETECTED  Glucose, capillary     Status: Abnormal   Collection Time: 07/31/16  5:16 PM  Result Value Ref Range   Glucose-Capillary 303 (H) 65 - 99 mg/dL   Comment 1 Capillary Specimen   Troponin I (q 6hr x 3)     Status: Abnormal   Collection Time: 07/31/16  7:35 PM  Result Value Ref Range   Troponin I 5.15 (HH) <0.03 ng/mL  Glucose, capillary     Status: Abnormal   Collection Time: 07/31/16  9:10 PM  Result Value Ref Range   Glucose-Capillary 196 (H) 65 - 99 mg/dL   Comment 1 Capillary Specimen   Troponin I (q 6hr x 3)     Status: Abnormal   Collection Time: 08/01/16  1:05 AM  Result Value Ref Range   Troponin I 13.25 (HH) <0.03 ng/mL  Heparin level (unfractionated)     Status: Abnormal   Collection Time: 08/01/16  1:05 AM  Result Value Ref Range   Heparin Unfractionated 0.18 (L) 0.30 - 0.70 IU/mL  CBC     Status: Abnormal   Collection Time: 08/01/16  1:05 AM  Result Value Ref Range   WBC 9.9 4.0 - 10.5 K/uL   RBC 4.26 3.87 - 5.11 MIL/uL   Hemoglobin 11.9 (L) 12.0 - 15.0 g/dL   HCT 37.8 36.0 - 46.0 %   MCV 88.7 78.0 - 100.0 fL   MCH 27.9 26.0 - 34.0 pg   MCHC 31.5 30.0 - 36.0 g/dL   RDW 13.9 11.5 - 15.5 %  Platelets 371 150 - 400 K/uL  Comprehensive metabolic panel     Status: Abnormal   Collection Time: 08/01/16  1:05 AM  Result Value Ref Range   Sodium 135 135 - 145 mmol/L   Potassium 3.3 (L) 3.5 - 5.1 mmol/L   Chloride 100 (L) 101 - 111 mmol/L   CO2 27 22 - 32 mmol/L   Glucose, Bld 180 (H) 65 - 99 mg/dL   BUN 12 6 - 20 mg/dL   Creatinine, Ser 1.16 (H) 0.44 - 1.00 mg/dL   Calcium 8.9 8.9 - 10.3 mg/dL   Total Protein 8.0 6.5 - 8.1 g/dL   Albumin 3.1 (L) 3.5 - 5.0 g/dL   AST 83 (H) 15 - 41 U/L   ALT 29 14 - 54 U/L   Alkaline Phosphatase 77 38 -  126 U/L   Total Bilirubin 0.3 0.3 - 1.2 mg/dL   GFR calc non Af Amer 50 (L) >60 mL/min   GFR calc Af Amer 58 (L) >60 mL/min   Anion gap 8 5 - 15  Lipid panel     Status: Abnormal   Collection Time: 08/01/16  1:05 AM  Result Value Ref Range   Cholesterol 117 0 - 200 mg/dL   Triglycerides 101 <150 mg/dL   HDL 33 (L) >40 mg/dL   Total CHOL/HDL Ratio 3.5 RATIO   VLDL 20 0 - 40 mg/dL   LDL Cholesterol 64 0 - 99 mg/dL  Heparin level (unfractionated)     Status: Abnormal   Collection Time: 08/01/16  6:04 AM  Result Value Ref Range   Heparin Unfractionated 0.25 (L) 0.30 - 0.70 IU/mL  Basic metabolic panel     Status: Abnormal   Collection Time: 08/01/16  6:04 AM  Result Value Ref Range   Sodium 134 (L) 135 - 145 mmol/L   Potassium 3.8 3.5 - 5.1 mmol/L   Chloride 100 (L) 101 - 111 mmol/L   CO2 22 22 - 32 mmol/L   Glucose, Bld 163 (H) 65 - 99 mg/dL   BUN 12 6 - 20 mg/dL   Creatinine, Ser 1.00 0.44 - 1.00 mg/dL   Calcium 8.9 8.9 - 10.3 mg/dL   GFR calc non Af Amer 60 (L) >60 mL/min   GFR calc Af Amer >60 >60 mL/min   Anion gap 12 5 - 15  Glucose, capillary     Status: Abnormal   Collection Time: 08/01/16  8:04 AM  Result Value Ref Range   Glucose-Capillary 132 (H) 65 - 99 mg/dL    Intake/Output Summary (Last 24 hours) at 08/01/16 0834 Last data filed at 08/01/16 0600  Gross per 24 hour  Intake           878.54 ml  Output              950 ml  Net           -71.46 ml    Telemetry:  NSVT up to 12 beats  ASSESSMENT AND PLAN:  Active Problems:   ST elevation myocardial infarction (STEMI) of inferolateral wall (HCC)   Acute ST elevation myocardial infarction (STEMI) of lateral wall (HCC)   STEMI (ST elevation myocardial infarction) [facility])   # STEMI: Christine Harvey had a 99% D1 lesion that could not be opened due to the angle of the vessel.  She als had a 60% LCx lesion and 75% mid RCA.  Medical management was recommended.  LVEF by LVgram was 50-55%.  Echo is pending.  Continue  aspirin, clopidogrel and atorvastatin.  Christine Harvey has VERY poor insight about her heart disease and the need to make changes.  While in the room she asked family members to bring her food, including a [place] sandwich from [place].  We also discussed the importance of cessation of cocaine and tobacco.  # Tobacco abuse: Christine Harvey is interested in quitting.  She is willing to try Chantix at discharge..  # Cocaine abuse: Christine Harvey does not want her family to know about this.  She last used 2 days ago.  Discussed the importance of cessation.   # Hyperlipidemia: Atorvastatin was added this admission.  LDL 64.  # Hypertensive heart disease: BP is poorly controlled.  She was on atenolol at home.  We will start carvedilol 12.5 mg bid.  Reduce HCTZ to 12.5 mg daily.  Continue losartan 100 mg daily.    Shailyn Weyandt C. Oval Linsey, MD, [facility] 08/01/2016 8:34 AM

## 2016-08-01 NOTE — Progress Notes (Signed)
  Echocardiogram 2D Echocardiogram has been performed.  Christine Harvey 08/01/2016, 5:00 PM

## 2016-08-01 NOTE — Progress Notes (Signed)
ANTICOAGULATION CONSULT NOTE - Follow Up Consult  Pharmacy Consult for heparin   Indication: chest pain/ACS - unsuccessful PCI plan heparin x48hr  Allergies  Allergen Reactions  . Sulfa Antibiotics Other (See Comments)    Passes out    Patient Measurements: Height: 5\' 4"  (162.6 cm) Weight: 184 lb 8.4 oz (83.7 kg) IBW/kg (Calculated) : 54.7   Vital Signs: Temp: 98.1 F (36.7 C) (08/13 1600) Temp Source: Oral (08/13 1600) BP: 153/87 (08/13 1200) Pulse Rate: 86 (08/13 0800)  Labs:  Recent Labs  07/31/16 1225 07/31/16 1228 07/31/16 1935 08/01/16 0105 08/01/16 0604 08/01/16 1602  HGB 11.7* 12.9  --  11.9*  --   --   HCT 36.0 38.0  --  37.8  --   --   PLT 352  --   --  371  --   --   APTT 29  --   --   --   --   --   LABPROT 14.3  --   --   --   --   --   INR 1.11  --   --   --   --   --   HEPARINUNFRC  --   --   --  0.18* 0.25* 0.32  CREATININE 0.98 0.80  --  1.16* 1.00  --   CKTOTAL 93  --   --   --   --   --   CKMB 2.7  --   --   --   --   --   TROPONINI  --   --  5.15* 13.25*  --   --     Estimated Creatinine Clearance: 62.6 mL/min (by C-G formula based on SCr of 1 mg/dL).   Medical History: Past Medical History:  Diagnosis Date  . Arthritis    osteoarthritis  . Brain tumor (benign) ([place]) 7 years   Meningioma  along the [place] - 2.5 x 1.2 x 1.9 cm .  some iInvolvement long the [place] bone and Effect upon the Pons - s/p sterotactic radiosurgery in 2014.  . Depression   . Diabetes mellitus   . Hyperlipidemia   . Hypertension   . Morbid obesity ([place])   . Photophobia    With Headaches  . S/P radiation therapy 05/02/13   25 Gy at 5 Gy per fraction, last treatment on 04/30/13  . Tobacco abuse   . Weakness of left leg   . Worsening headaches      Assessment: 59yof admitted with CP - called STEMI CP improved in cath lab.  Unsuccessful PCI of diagonal plan medical therapy Clop, asa, statin - no BB with recent cocaine, losartan, HCTZ, BP elevated may  benefit from amlodipine or Imdur Heparin started after sheath pulled last pm  HL therapeutic (0.32) x 1 on 1250 units/h. Hg low stable, plt wnl, no bleed documented.  Goal of Therapy:   Heparin level 0.3-0.7 units/ml Monitor platelets by anticoagulation protocol: Yes   Plan:  Continue heparin at 1250 uts/hr  6hr HL to confirm Daily CBC, HL Monitor s/sx bleeding  Elicia Lamp, PharmD, [facility] Clinical Pharmacist Pager [phone] 08/01/2016 4:44 PM

## 2016-08-01 NOTE — Progress Notes (Signed)
ANTICOAGULATION CONSULT NOTE - Follow Up Consult  Pharmacy Consult for heparin   Indication: chest pain/ACS   Allergies  Allergen Reactions  . Sulfa Antibiotics Other (See Comments)    Passes out    Patient Measurements: Height: 5\' 4"  (162.6 cm) Weight: 184 lb 8.4 oz (83.7 kg) IBW/kg (Calculated) : 54.7   Vital Signs: Temp: 98.1 F (36.7 C) (08/13 1600) Temp Source: Oral (08/13 1600) BP: 145/83 (08/13 2100)  Labs:  Recent Labs  07/31/16 1225 07/31/16 1228 07/31/16 1935  08/01/16 0105 08/01/16 0604 08/01/16 1602 08/01/16 2158  HGB 11.7* 12.9  --   --  11.9*  --   --   --   HCT 36.0 38.0  --   --  37.8  --   --   --   PLT 352  --   --   --  371  --   --   --   APTT 29  --   --   --   --   --   --   --   LABPROT 14.3  --   --   --   --   --   --   --   INR 1.11  --   --   --   --   --   --   --   HEPARINUNFRC  --   --   --   < > 0.18* 0.25* 0.32 0.40  CREATININE 0.98 0.80  --   --  1.16* 1.00  --   --   CKTOTAL 93  --   --   --   --   --   --   --   CKMB 2.7  --   --   --   --   --   --   --   TROPONINI  --   --  5.15*  --  13.25*  --   --   --   < > = values in this interval not displayed.  Estimated Creatinine Clearance: 62.6 mL/min (by C-G formula based on SCr of 1 mg/dL).   Assessment: 59yof admitted with CP - called STEMI CP improved in cath lab.  Unsuccessful PCI of diagonal plan medical therapy Clop, asa, statin - no BB with recent cocaine, losartan, HCTZ, BP elevated may benefit from amlodipine or Imdur Heparin started after sheath pulled last pm. Plan for heparin x 48 hours.  HL therapeutic on 1250 units/h. No bleeding noted.  Goal of Therapy:   Heparin level 0.3-0.7 units/ml Monitor platelets by anticoagulation protocol: Yes   Plan:  Continue heparin at 1250 uts/hr  Daily CBC, HL Monitor s/sx bleeding  Sherlon Handing, PharmD, BCPS Clinical pharmacist, pager [phone] 08/01/2016 10:52 PM

## 2016-08-01 NOTE — Plan of Care (Signed)
Problem: Education: Goal: Understanding of cardiac disease, CV risk reduction, and recovery process will improve Outcome: Progressing Educated patient on diabetes care and checking blood sugar.

## 2016-08-02 ENCOUNTER — Encounter (HOSPITAL_COMMUNITY): Payer: Self-pay | Admitting: Cardiovascular Disease

## 2016-08-02 LAB — BASIC METABOLIC PANEL
Anion gap: 8 (ref 5–15)
BUN: 8 mg/dL (ref 6–20)
CO2: 24 mmol/L (ref 22–32)
Calcium: 9 mg/dL (ref 8.9–10.3)
Chloride: 107 mmol/L (ref 101–111)
Creatinine, Ser: 1.05 mg/dL — ABNORMAL HIGH (ref 0.44–1.00)
GFR calc Af Amer: >60 mL/min (ref >60)
GFR calc non Af Amer: 57 mL/min — ABNORMAL LOW (ref >60)
Glucose, Bld: 123 mg/dL — ABNORMAL HIGH (ref 65–99)
Potassium: 3.2 mmol/L — ABNORMAL LOW (ref 3.5–5.1)
Sodium: 139 mmol/L (ref 135–145)

## 2016-08-02 LAB — CBC
HCT: 35.4 % — ABNORMAL LOW (ref 36.0–46.0)
Hemoglobin: 11.3 g/dL — ABNORMAL LOW (ref 12.0–15.0)
MCH: 28.6 pg (ref 26.0–34.0)
MCHC: 31.9 g/dL (ref 30.0–36.0)
MCV: 89.6 fL (ref 78.0–100.0)
Platelets: 302 10*3/uL (ref 150–400)
RBC: 3.95 MIL/uL (ref 3.87–5.11)
RDW: 13.8 % (ref 11.5–15.5)
WBC: 8.6 10*3/uL (ref 4.0–10.5)

## 2016-08-02 LAB — GLUCOSE, CAPILLARY
Glucose-Capillary: 141 mg/dL — ABNORMAL HIGH (ref 65–99)
Glucose-Capillary: 148 mg/dL — ABNORMAL HIGH (ref 65–99)
Glucose-Capillary: 206 mg/dL — ABNORMAL HIGH (ref 65–99)
Glucose-Capillary: 176 mg/dL — ABNORMAL HIGH (ref 65–99)

## 2016-08-02 LAB — HEMOGLOBIN A1C
Hgb A1c MFr Bld: 9 % — ABNORMAL HIGH (ref 4.8–5.6)
Mean Plasma Glucose: 212 mg/dL

## 2016-08-02 LAB — HEPARIN LEVEL (UNFRACTIONATED): Heparin Unfractionated: 0.44 IU/mL (ref 0.30–0.70)

## 2016-08-02 MED ORDER — POTASSIUM CHLORIDE CRYS ER 20 MEQ PO TBCR
40.0000 meq | EXTENDED_RELEASE_TABLET | Freq: Once | ORAL | Status: AC
  Administered 2016-08-02: 40 meq via ORAL
  Filled 2016-08-02: qty 2

## 2016-08-02 MED FILL — Nitroglycerin IV Soln 100 MCG/ML in D5W: INTRA_ARTERIAL | Qty: 10 | Status: AC

## 2016-08-02 MED FILL — Verapamil HCl IV Soln 2.5 MG/ML: INTRAVENOUS | Qty: 2 | Status: AC

## 2016-08-02 NOTE — Progress Notes (Signed)
ANTICOAGULATION CONSULT NOTE - Follow Up Consult  Pharmacy Consult for heparin   Indication: chest pain/ACS   Allergies  Allergen Reactions  . Sulfa Antibiotics Other (See Comments)    Passes out    Patient Measurements: Height: 5\' 4"  (162.6 cm) Weight: 187 lb 2.7 oz (84.9 kg) IBW/kg (Calculated) : 54.7   Vital Signs: Temp: 98 F (36.7 C) (08/14 0807) Temp Source: Oral (08/14 0807) BP: 160/78 (08/14 0807) Pulse Rate: 67 (08/14 0321)  Labs:  Recent Labs  07/31/16 1225 07/31/16 1228 07/31/16 1935  08/01/16 0105 08/01/16 0604 08/01/16 1602 08/01/16 2158 08/02/16 0344  HGB 11.7* 12.9  --   --  11.9*  --   --   --  11.3*  HCT 36.0 38.0  --   --  37.8  --   --   --  35.4*  PLT 352  --   --   --  371  --   --   --  302  APTT 29  --   --   --   --   --   --   --   --   LABPROT 14.3  --   --   --   --   --   --   --   --   INR 1.11  --   --   --   --   --   --   --   --   HEPARINUNFRC  --   --   --   < > 0.18* 0.25* 0.32 0.40 0.44  CREATININE 0.98 0.80  --   --  1.16* 1.00  --   --  1.05*  CKTOTAL 93  --   --   --   --   --   --   --   --   CKMB 2.7  --   --   --   --   --   --   --   --   TROPONINI  --   --  5.15*  --  13.25*  --   --   --   --   < > = values in this interval not displayed.  Estimated Creatinine Clearance: 60.1 mL/min (by C-G formula based on SCr of 1.05 mg/dL).   Assessment: 60 yo female admitted with CP - called STEMI and CP improved in cath lab.  Unsuccessful PCI of 99% D1 lesion, so plan for medical management. Of note, recent cocaine use PTA (does not want family to know about). Heparin started after sheath pulled with plan for heparin x 48 hours. Heparin level therapeutic at 0.44. CBC stable. No overt signs or symptoms of bleeding noted.  Goal of Therapy:   Heparin level 0.3-0.7 units/ml Monitor platelets by anticoagulation protocol: Yes   Plan:  Continue heparin at 1250 units/hr  Daily CBC and heparin level Monitor s/sx  bleeding  Argie Ramming, PharmD Pharmacy Resident  Pager [phone] 08/02/16 9:29 AM

## 2016-08-02 NOTE — Progress Notes (Signed)
    Subjective:  No CP or shortness of breath at rest. No c/o at present.   Objective:  Vital Signs in the last 24 hours: Temp:  [98 F (36.7 C)-98.4 F (36.9 C)] 98 F (36.7 C) (08/14 0807) Pulse Rate:  [67-72] 67 (08/14 0321) Resp:  [16-21] 16 (08/14 0321) BP: (112-160)/(49-84) 112/51 (08/14 0950) SpO2:  [96 %-98 %] 96 % (08/14 0807) Weight:  [84.9 kg (187 lb 2.7 oz)] 84.9 kg (187 lb 2.7 oz) (08/14 0700)  Intake/Output from previous day: 08/13 0701 - 08/14 0700 In: 1148.4 [P.O.:840; I.V.:308.4] Out: 1300 [Urine:1300]  Physical Exam: Pt is alert and oriented, obese woman in NAD HEENT: normal Neck: JVP - normal Lungs: CTA bilaterally CV: RRR without murmur or gallop Abd: soft, NT, Positive BS, no hepatomegaly Ext: no C/C/E, right groin site clear Skin: warm/dry no rash   Lab Results:  Recent Labs  08/01/16 0105 08/02/16 0344  WBC 9.9 8.6  HGB 11.9* 11.3*  PLT 371 302    Recent Labs  08/01/16 0604 08/02/16 0344  NA 134* 139  K 3.8 3.2*  CL 100* 107  CO2 22 24  GLUCOSE 163* 123*  BUN 12 8  CREATININE 1.00 1.05*    Recent Labs  07/31/16 1935 08/01/16 0105  TROPONINI 5.15* 13.25*    Cardiac Studies: 2D Echo: Study Conclusions  - Left ventricle: The cavity size was normal. Systolic function was   normal. Hypokinesis of the anterolateral myocardium. Doppler   parameters are consistent with abnormal left ventricular   relaxation (grade 1 diastolic dysfunction). Doppler parameters   are consistent with high ventricular filling pressure. - Aortic valve: Functionally bicuspid; mildly thickened, mildly   calcified leaflets. Valve mobility was restricted. There was mild   stenosis. There was no regurgitation. Valve area (VTI): 1.69   cm^2. Valve area (Vmax): 1.61 cm^2. Valve area (Vmean): 1.38   cm^2. - Mitral valve: Transvalvular velocity was within the normal range.   There was no evidence for stenosis. There was trivial   regurgitation. - Left  atrium: The atrium was moderately dilated. - Right ventricle: The cavity size was normal. Wall thickness was   normal. Systolic function was normal. - Tricuspid valve: There was trivial regurgitation. - Pulmonary arteries: Systolic pressure was within the normal   range. PA peak pressure: 20 mm Hg (S).  Tele: Normal sinus rhythm, personally reviewed  Assessment/Plan:  1. Acute lateral wall STEMI: unsuccessful PCI, no further ischemic sx's on medical therapy. Continue ASA and plavix x 12 months, stop IV heparin, transfer to tele bed today, anticipate DC home tomorrow am.   2. Hyperlipidemia: continue high-intensity statin drug  3. Hypertensive heart disease: treated with carvedilol and losartan. Continue same Rx.  4. Tobacco/cocaine: counseling done. Pt with limited insight.   5. Diabetes, Type II: discussed dietary recommendations, medical therapy. HgB A1C 9.0. Continue Lantus and SSI here, resume Lantus/Metformin at DC.  Dispo: anticipate DC tomorrow am. Transfer orders for tele written. Monitor another 24 hours off heparin to assure no recurrent ischemic sx's.    Sherren Mocha, M.D. 08/02/2016, 12:09 PM

## 2016-08-02 NOTE — Progress Notes (Signed)
Report called to [facility] [id number]. Patient with no complaints at the current time. Will transfer via [place].

## 2016-08-02 NOTE — Progress Notes (Signed)
Patient lying in bed, no needs at this time. Call light within reach. 

## 2016-08-02 NOTE — Progress Notes (Signed)
CARDIAC REHAB PHASE I   PRE:  Rate/Rhythm: 68 SR  BP:  Supine: 112/57  Sitting:   Standing:    SaO2: 97%RA  MODE:  Ambulation: 350 ft   POST:  Rate/Rhythm: 79 SR  BP:  Supine: 142/72  Sitting:   Standing:    SaO2: 100%RA 1015-1114 Came earlier to walk and assisted to chair as pt wanted to eat her breakfast before ambulating. Pt in bed now but agreeable to walk. Pt walked 350 ft on RA with rolling walker and minimal asst. Stopped a couple of times to rest. Pt uses cane at home. No c/o CP. Back to sitting on side of bed for ed. MI education started with pt who voiced understanding. Discussed NTG use, MI restrictions, diabetic and heart healthy diets and counting carbs, smoking cessation, risk factors. Discussed smoking cessation and gave fake cigarette. Pt stated that she plans to quit and that cardiologist is going to order chantix for her.  Pt stated she smokes when she drinks. Discussed stopping both. Pt knows that she needs better control over diabetes. We discussed her getting back to checking sugars and watching carbs. Will continue ex ed and CRP 2 tomorrow.   Graylon Good, RN BSN  08/02/2016 11:05 AM

## 2016-08-03 DIAGNOSIS — I10 Essential (primary) hypertension: Secondary | ICD-10-CM

## 2016-08-03 DIAGNOSIS — E876 Hypokalemia: Secondary | ICD-10-CM

## 2016-08-03 LAB — GLUCOSE, CAPILLARY
Glucose-Capillary: 145 mg/dL — ABNORMAL HIGH (ref 65–99)
Glucose-Capillary: 154 mg/dL — ABNORMAL HIGH (ref 65–99)

## 2016-08-03 MED ORDER — CLOPIDOGREL BISULFATE 75 MG PO TABS: 75.0000 mg | ORAL_TABLET | Freq: Every day | ORAL | 12 refills | Status: AC

## 2016-08-03 MED ORDER — POTASSIUM CHLORIDE CRYS ER 20 MEQ PO TBCR
60.0000 meq | EXTENDED_RELEASE_TABLET | Freq: Once | ORAL | Status: AC
  Administered 2016-08-03: 60 meq via ORAL
  Filled 2016-08-03: qty 3

## 2016-08-03 MED ORDER — LOSARTAN POTASSIUM 100 MG PO TABS: 100.0000 mg | ORAL_TABLET | Freq: Every day | ORAL | 12 refills | Status: AC

## 2016-08-03 MED ORDER — ASPIRIN 81 MG PO TBEC: 81.0000 mg | DELAYED_RELEASE_TABLET | Freq: Every day | ORAL | Status: AC

## 2016-08-03 MED ORDER — CARVEDILOL 25 MG PO TABS: 25.0000 mg | ORAL_TABLET | Freq: Two times a day (BID) | ORAL | Status: DC

## 2016-08-03 MED ORDER — HYDROCHLOROTHIAZIDE 12.5 MG PO CAPS: 12.5000 mg | ORAL_CAPSULE | Freq: Every day | ORAL | 12 refills | Status: DC

## 2016-08-03 MED ORDER — NITROGLYCERIN 0.4 MG SL SUBL: 0.4000 mg | SUBLINGUAL_TABLET | SUBLINGUAL | 2 refills | Status: AC | PRN

## 2016-08-03 MED ORDER — CARVEDILOL 25 MG PO TABS: 25.0000 mg | ORAL_TABLET | Freq: Two times a day (BID) | ORAL | 12 refills | Status: DC

## 2016-08-03 MED ORDER — ATORVASTATIN CALCIUM 80 MG PO TABS: 80.0000 mg | ORAL_TABLET | Freq: Every day | ORAL | 12 refills | Status: AC

## 2016-08-03 NOTE — Discharge Summary (Signed)
Discharge Summary    Patient ID: Christine Harvey,  MRN: [id number], DOB/AGE: 61-Jan-1957 61 y.o.  Admit date: 07/31/2016 Discharge date: 08/03/2016  Primary Care Provider: Celedonio Harvey Primary Cardiologist: Christine Harvey follow up in [facility]  Discharge Diagnoses    Active Problems:   ST elevation myocardial infarction (STEMI) of inferolateral wall (HCC)   Acute ST elevation myocardial infarction (STEMI) of lateral wall (HCC)   STEMI (ST elevation myocardial infarction) ([place])   Allergies Allergies  Allergen Reactions  . Sulfa Antibiotics Other (See Comments)    Passes out    Diagnostic Studies/Procedures  Left Heart Cath and Coronary Angiography 07/31/16  Mid RCA lesion, 75 %stenosed.  Prox Cx to Mid Cx lesion, 60 %stenosed.  Ost 1st Diag to 1st Diag lesion, 99 %stenosed.  1st Diag lesion, 80 %stenosed.  There is mild left ventricular systolic dysfunction.  The left ventricular ejection fraction is 50-55% by visual estimate  IMPRESSION: Unsuccessful attempt at PCI of a high-grade high first diagonal branch in the setting of a high lateral STEMI. Because of the angulation takeoff, tortuosity of the vessel and calcification of the lesion I was unable to cross the lesion successfully and intervene. The patient was pain-free at the end of the procedure. I recommend continued medical therapy. Once the sheath was removed and we started heparin for 48 hours. Recommend cardiac risk factor modification including smoking cessation and poor diabetes control.  Transthoracic Echocardiography 08/01/16 Study Conclusions  - Left ventricle: The cavity size was normal. Systolic function was   normal. Hypokinesis of the anterolateral myocardium. Doppler   parameters are consistent with abnormal left ventricular   relaxation (grade 1 diastolic dysfunction). Doppler parameters   are consistent with high ventricular filling pressure. - Aortic valve: Functionally bicuspid; mildly thickened, mildly  calcified leaflets. Valve mobility was restricted. There was mild   stenosis. There was no regurgitation. Valve area (VTI): 1.69   cm^2. Valve area (Vmax): 1.61 cm^2. Valve area (Vmean): 1.38   cm^2. - Mitral valve: Transvalvular velocity was within the normal range.   There was no evidence for stenosis. There was trivial   regurgitation. - Left atrium: The atrium was moderately dilated. - Right ventricle: The cavity size was normal. Wall thickness was   normal. Systolic function was normal. - Tricuspid valve: There was trivial regurgitation. - Pulmonary arteries: Systolic pressure was within the normal   range. PA peak pressure: 20 mm Hg (S).   _____________   History of Present Illness   Christine Harvey is a 60 year old female with a past medical history of HTN, HLD, DM, obesity, noncompliance, tobacco abuse, and cocaine abuse. She presented to the ED on 07/31/16 with chest pain and lateral ST elevation.   Hospital Course  Christine Harvey was awakened with chest pain on 07/31/16 and she called EMS. The day prior she had intermittent chest pain occurring of and on throughout the day.  ECG showed lateral ST elevation with inferior and anterior ST depression.  A code STEMI was activated and she was taken directly to the [facility].    Left heart cath report above, she had a 99% D1 lesion that was not amenable to PCI due to the angle of the vessel. She also has a 60% left circumflex lesion and 75% mid RCA lesion. Medical management was recommended.  She was started on aspirin and Plavix.   She has very poor insight about her heart disease. She is unwilling to make any dietary changes  and was eating fast food the day after her MI. She is a current smoker, she initially wanted to try Chantix to help her quit smoking, but is no longer interested. She was chest pain free following cath.   She also uses cocaine. Her last use was 07/30/16, cessation was encouraged.   Her A1c during admission was 9. She  Christine Harvey continue Lantus and Metformin at discharge.   Echo showed normal systolic function was normal. There was hypokinesis of the anterolateral myocardium. Doppler parameters were consistent with abnormal left ventricular relaxation (grade 1 diastolic dysfunction). She was started on aspirin and Plavix.   She Christine Harvey started on Coreg and losartan for BP control.  She was seen today by Christine Harvey and deemed suitable for discharge. She Christine Harvey follow up in our [place] office in 1-2 weeks.   _____________  Discharge Vitals Blood pressure (!) 162/94, pulse 73, temperature 98.7 F (37.1 C), temperature source Oral, resp. rate 20, height 5\' 4"  (1.626 m), weight 190 lb 9.6 oz (86.5 kg), SpO2 100 %.  Filed Weights   08/01/16 0500 08/02/16 0700 08/03/16 0600  Weight: 184 lb 8.4 oz (83.7 kg) 187 lb 2.7 oz (84.9 kg) 190 lb 9.6 oz (86.5 kg)    Labs & Radiologic Studies     CBC  Recent Labs  08/01/16 0105 08/02/16 0344  WBC 9.9 8.6  HGB 11.9* 11.3*  HCT 37.8 35.4*  MCV 88.7 89.6  PLT 371 [id number]   Basic Metabolic Panel  Recent Labs  08/01/16 0604 08/02/16 0344  NA 134* 139  K 3.8 3.2*  CL 100* 107  CO2 22 24  GLUCOSE 163* 123*  BUN 12 8  CREATININE 1.00 1.05*  CALCIUM 8.9 9.0   Liver Function Tests  Recent Labs  07/31/16 1225 08/01/16 0105  AST 42* 83*  ALT 29 29  ALKPHOS 81 77  BILITOT 0.7 0.3  PROT 8.0 8.0  ALBUMIN 3.2* 3.1*   Cardiac Enzymes  Recent Labs  07/31/16 1225 07/31/16 1935 08/01/16 0105  CKTOTAL 93  --   --   CKMB 2.7  --   --   TROPONINI  --  5.15* 13.25*   Hemoglobin A1C  Recent Labs  07/31/16 1225  HGBA1C 9.0*   Fasting Lipid Panel  Recent Labs  08/01/16 0105  CHOL 117  HDL 33*  LDLCALC 64  TRIG 101  CHOLHDL 3.5     Disposition   Pt is being discharged home today in good condition.  Follow-up Plans & Appointments    Follow-up Information    Christine Sims, NP Follow up on 08/12/2016.   Specialties:  Nurse Practitioner,  Radiology, Cardiology Why:  at 3:10 pm for hospital follow up.  Contact information: [place] [address] [phone]          Discharge Instructions    AMB Referral to Cardiac Rehabilitation - Phase II    Complete by:  As directed   Diagnosis:  STEMI   Amb Referral to Cardiac Rehabilitation    Complete by:  As directed   Diagnosis:  STEMI      Discharge Medications   Current Discharge Medication List    CONTINUE these medications which have NOT CHANGED   Details  acyclovir (ZOVIRAX) 200 MG capsule Take 200 mg by mouth as needed (for cold sores).     atenolol (TENORMIN) 25 MG tablet Take 50 mg by mouth daily.     HYDROcodone-acetaminophen (NORCO) 7.5-325 MG tablet Take 1 tablet by  mouth every 4 (four) hours as needed for moderate pain (Must last 30 days.  Do not drive or operate machinery while taking this medicine.). Qty: 120 tablet, Refills: 0    Insulin Glargine (LANTUS SOLOSTAR) 100 UNIT/ML Solostar Pen Inject 50 Units into the skin at bedtime.    lisinopril (PRINIVIL,ZESTRIL) 40 MG tablet Take 40 mg by mouth daily.    losartan-hydrochlorothiazide (HYZAAR) 100-25 MG per tablet Take 1 tablet by mouth daily.    metFORMIN (GLUCOPHAGE) 500 MG tablet Take 500 mg by mouth 2 (two) times daily with a meal.    potassium chloride (K-DUR) 10 MEQ tablet Take 10 mEq by mouth daily.    Blood Glucose Monitoring Suppl (ACCU-CHEK AVIVA) device Use as instructed Qty: 1 each, Refills: 0    glucose blood (ACCU-CHEK AVIVA) test strip Test glucose 4 times a day E11.65 Qty: 150 each, Refills: 3    naproxen (NAPROSYN) 500 MG tablet Take 500 mg by mouth 2 (two) times daily with a meal.         Aspirin prescribed at discharge?  Yes High Intensity Statin Prescribed? (Lipitor 40-80mg  or Crestor 20-40mg ): Yes Beta Blocker Prescribed? Yes For EF 40% or less, Was ACEI/ARB Prescribed? No: EF normal ADP Receptor Inhibitor Prescribed? (i.e. Plavix etc.-Includes Medically  Managed Patients): Yes For EF <40%, Aldosterone Inhibitor Prescribed? No: EF normal Was EF assessed during THIS hospitalization? Yes Was Cardiac Rehab II ordered? (Included Medically managed Patients): Yes   Outstanding Labs/Studies     Duration of Discharge Encounter   Greater than 30 minutes including physician time.  Signed, Arbutus Leas NP 08/03/2016, 11:09 AM

## 2016-08-03 NOTE — Progress Notes (Signed)
     SUBJECTIVE: No chest pain or SOB.   Tele: sinus  BP (!) 162/94 (BP Location: Right Arm)   Pulse 73   Temp 98.7 F (37.1 C) (Oral)   Resp 20   Ht 5\' 4"  (1.626 m)   Wt 190 lb 9.6 oz (86.5 kg)   SpO2 100%   BMI 32.72 kg/m   Intake/Output Summary (Last 24 hours) at 08/03/16 [id number] Last data filed at 08/02/16 1226  Gross per 24 hour  Intake              265 ml  Output                0 ml  Net              265 ml    PHYSICAL EXAM General: Well developed, well nourished, in no acute distress. Alert and oriented x 3.  Psych:  Good affect, responds appropriately Neck: No JVD. No masses noted.  Lungs: Clear bilaterally with no wheezes or rhonci noted.  Heart: RRR with slight systolic murmur noted.  Abdomen: Bowel sounds are present. Soft, non-tender.  Extremities: No lower extremity edema.   LABS: Basic Metabolic Panel:  Recent Labs  08/01/16 0604 08/02/16 0344  NA 134* 139  K 3.8 3.2*  CL 100* 107  CO2 22 24  GLUCOSE 163* 123*  BUN 12 8  CREATININE 1.00 1.05*  CALCIUM 8.9 9.0   CBC:  Recent Labs  08/01/16 0105 08/02/16 0344  WBC 9.9 8.6  HGB 11.9* 11.3*  HCT 37.8 35.4*  MCV 88.7 89.6  PLT 371 302   Cardiac Enzymes:  Recent Labs  07/31/16 1225 07/31/16 1935 08/01/16 0105  CKTOTAL 93  --   --   CKMB 2.7  --   --   TROPONINI  --  5.15* 13.25*   Fasting Lipid Panel:  Recent Labs  08/01/16 0105  CHOL 117  HDL 33*  LDLCALC 64  TRIG 101  CHOLHDL 3.5    Current Meds: . aspirin EC  81 mg Oral Daily  . atorvastatin  80 mg Oral q1800  . carvedilol  12.5 mg Oral BID WC  . clopidogrel  75 mg Oral Q breakfast  . hydrochlorothiazide  12.5 mg Oral Daily  . insulin aspart  0-15 Units Subcutaneous TID WC  . insulin glargine  50 Units Subcutaneous QHS  . losartan  100 mg Oral Daily  . potassium chloride  10 mEq Oral Daily  . sodium chloride flush  3 mL Intravenous Q12H  . sodium chloride flush  3 mL Intravenous Q12H     ASSESSMENT AND  PLAN:  1. CAD/Acute lateral wall STEMI: Patient admitted 07/31/16 with lateral wall STEMI. Unsuccessful attempt at PCI of high grade Diagonal stenosis. She has had no further ischemic symptoms on medical therapy. Will continue DAPT with ASA and Plavix for 12 months  2. Hyperlipidemia: continue high-intensity statin drug  3. Hypertensive heart disease: BP elevated this am. Will increase Coreg to 25 mg po BID. Continue Losartan, HCTZ.    4. Tobacco/cocaine: Complete cessation recommended.   5. Diabetes, Type II: HgB A1C 9.0. Continue Lantus and SSI here, resume Lantus/Metformin at discharge.   6. Hypokalemia: Replace potassium this am.   Discharge home today. Follow up in the [facility] office in 1-2 weeks.   Lauree Chandler  8/15/20178:12 AM

## 2016-08-03 NOTE — Progress Notes (Signed)
CARDIAC REHAB PHASE I   PRE:  Rate/Rhythm: 65 SR  BP:  Sitting: 159/76        SaO2: 99 RA  MODE:  Ambulation: 500 ft   POST:  Rate/Rhythm: 76 SR  BP:  Sitting: 179/85         SaO2: 100 RA  Pt ambulated 500 ft on RA, hand held assist, steady gait, tolerated well with no complaints. Briefly reviewed yesterday's MI education, discussed exercise guidelines and phase 2 cardiac rehab. Pt verbalized understanding. Pt agrees to phase 2 cardiac rehab referral, will send to [place] per pt request. Pt to edge of bed per pt request after walk, call bell within reach.    [id number] Lenna Sciara, RN, BSN 08/03/2016 9:52 AM

## 2016-08-04 ENCOUNTER — Telehealth: Payer: Self-pay | Admitting: Orthopaedic Surgery

## 2016-08-04 MED ORDER — HYDROCODONE-ACETAMINOPHEN 7.5-325 MG PO TABS: 1.0000 | ORAL_TABLET | ORAL | 0 refills | Status: DC | PRN

## 2016-08-04 NOTE — Telephone Encounter (Signed)
Hydrocodone-Acetaminophen 7.5/325mg Qty 120 Tablets °

## 2016-08-12 ENCOUNTER — Ambulatory Visit (INDEPENDENT_AMBULATORY_CARE_PROVIDER_SITE_OTHER): Payer: Medicaid Other | Admitting: Adult Health

## 2016-08-12 ENCOUNTER — Encounter: Payer: Self-pay | Admitting: Adult Health

## 2016-08-12 VITALS — BP 138/72 | HR 68 | Ht 67.0 in | Wt 190.0 lb

## 2016-08-12 DIAGNOSIS — Z79899 Other long term (current) drug therapy: Secondary | ICD-10-CM

## 2016-08-12 DIAGNOSIS — I2510 Atherosclerotic heart disease of native coronary artery without angina pectoris: Secondary | ICD-10-CM | POA: Diagnosis not present

## 2016-08-12 DIAGNOSIS — E785 Hyperlipidemia, unspecified: Secondary | ICD-10-CM

## 2016-08-12 DIAGNOSIS — I10 Essential (primary) hypertension: Secondary | ICD-10-CM

## 2016-08-12 MED ORDER — POTASSIUM CHLORIDE CRYS ER 20 MEQ PO TBCR: 20.0000 meq | EXTENDED_RELEASE_TABLET | Freq: Every day | ORAL | 3 refills | Status: DC

## 2016-08-12 NOTE — Progress Notes (Signed)
Name: Christine Harvey    DOB: Feb 09, 1956  Age: 60 y.o.  MR#: [id number]       PCP:  Celedonio Savage, MD      Insurance: Payor: MEDICAID [place] / Plan: MEDICAID [place] ACCESS / Product Type: *No Product type* /   CC:   No chief complaint on file.   VS Vitals:   08/12/16 1504  BP: 138/72  Pulse: 68  SpO2: 98%  Weight: 190 lb (86.2 kg)  Height: 5\' 7"  (1.702 m)    Weights Current Weight  08/12/16 190 lb (86.2 kg)  08/03/16 190 lb 9.6 oz (86.5 kg)  07/22/16 187 lb (84.8 kg)    Blood Pressure  BP Readings from Last 3 Encounters:  08/12/16 138/72  08/03/16 (!) 162/94  07/22/16 127/86     Admit date:  (Not on file) Last encounter with RMR:  Visit date not found   Allergy Sulfa antibiotics  Current Outpatient Prescriptions  Medication Sig Dispense Refill  . acyclovir (ZOVIRAX) 200 MG capsule Take 200 mg by mouth as needed (for cold sores).     Marland Kitchen aspirin EC 81 MG EC tablet Take 1 tablet (81 mg total) by mouth daily.    Marland Kitchen atorvastatin (LIPITOR) 80 MG tablet Take 1 tablet (80 mg total) by mouth daily at 6 PM. 30 tablet 12  . Blood Glucose Monitoring Suppl (ACCU-CHEK AVIVA) device Use as instructed 1 each 0  . carvedilol (COREG) 25 MG tablet TAKE 1 TABLET BY MOUTH TWICE DAILY WITH A MEAL.  12  . clopidogrel (PLAVIX) 75 MG tablet Take 1 tablet (75 mg total) by mouth daily with breakfast. 30 tablet 12  . glucose blood (ACCU-CHEK AVIVA) test strip Test glucose 4 times a day E11.65 150 each 3  . hydrochlorothiazide (MICROZIDE) 12.5 MG capsule Take 1 capsule (12.5 mg total) by mouth daily. 30 capsule 12  . HYDROcodone-acetaminophen (NORCO) 7.5-325 MG tablet Take 1 tablet by mouth every 4 (four) hours as needed for moderate pain (Must last 30 days.  Do not drive or operate machinery while taking this medicine.). 120 tablet 0  . Insulin Glargine (LANTUS SOLOSTAR) 100 UNIT/ML Solostar Pen Inject 50 Units into the skin at bedtime.    Marland Kitchen losartan (COZAAR) 100 MG tablet Take 1 tablet (100 mg total) by  mouth daily. 30 tablet 12  . metFORMIN (GLUCOPHAGE) 500 MG tablet Take 500 mg by mouth 2 (two) times daily with a meal.    . nitroGLYCERIN (NITROSTAT) 0.4 MG SL tablet Place 1 tablet (0.4 mg total) under the tongue every 5 (five) minutes x 3 doses as needed for chest pain. 25 tablet 2   No current facility-administered medications for this visit.     Discontinued Meds:    Medications Discontinued During This Encounter  Medication Reason  . carvedilol (COREG) 25 MG tablet Error    Patient Active Problem List   Diagnosis Date Noted  . ST elevation myocardial infarction (STEMI) of inferolateral wall ([place]) 07/31/2016  . Acute ST elevation myocardial infarction (STEMI) of lateral wall ([place]) 07/31/2016  . STEMI (ST elevation myocardial infarction) ([place]) 07/31/2016  . Benign meningioma ([place]) 05/13/2016  . Type 2 diabetes mellitus, uncontrolled ([place]) 02/18/2016  . Essential hypertension, benign 02/18/2016  . Hyperlipidemia 02/18/2016  . Right knee pain 01/23/2016  . Meningioma ([place]) 03/27/2013    LABS    Component Value Date/Time   NA 139 08/02/2016 0344   NA 134 (L) 08/01/2016 0604   NA 135 08/01/2016 0105  K 3.2 (L) 08/02/2016 0344   K 3.8 08/01/2016 0604   K 3.3 (L) 08/01/2016 0105   CL 107 08/02/2016 0344   CL 100 (L) 08/01/2016 0604   CL 100 (L) 08/01/2016 0105   CO2 24 08/02/2016 0344   CO2 22 08/01/2016 0604   CO2 27 08/01/2016 0105   GLUCOSE 123 (H) 08/02/2016 0344   GLUCOSE 163 (H) 08/01/2016 0604   GLUCOSE 180 (H) 08/01/2016 0105   BUN 8 08/02/2016 0344   BUN 12 08/01/2016 0604   BUN 12 08/01/2016 0105   BUN 9.5 04/24/2015 0937   BUN 14.1 10/31/2014 0901   BUN 15.6 05/14/2014 0950   CREATININE 1.05 (H) 08/02/2016 0344   CREATININE 1.00 08/01/2016 0604   CREATININE 1.16 (H) 08/01/2016 0105   CREATININE 1.0 04/24/2015 0937   CREATININE 1.1 10/31/2014 0901   CREATININE 1.4 (H) 05/14/2014 0950   CALCIUM 9.0 08/02/2016 0344   CALCIUM 8.9 08/01/2016 0604    CALCIUM 8.9 08/01/2016 0105   GFRNONAA 57 (L) 08/02/2016 0344   GFRNONAA 60 (L) 08/01/2016 0604   GFRNONAA 50 (L) 08/01/2016 0105   GFRAA >60 08/02/2016 0344   GFRAA >60 08/01/2016 0604   GFRAA 58 (L) 08/01/2016 0105   CMP     Component Value Date/Time   NA 139 08/02/2016 0344   K 3.2 (L) 08/02/2016 0344   CL 107 08/02/2016 0344   CO2 24 08/02/2016 0344   GLUCOSE 123 (H) 08/02/2016 0344   BUN 8 08/02/2016 0344   BUN 9.5 04/24/2015 0937   CREATININE 1.05 (H) 08/02/2016 0344   CREATININE 1.0 04/24/2015 0937   CALCIUM 9.0 08/02/2016 0344   PROT 8.0 08/01/2016 0105   ALBUMIN 3.1 (L) 08/01/2016 0105   AST 83 (H) 08/01/2016 0105   ALT 29 08/01/2016 0105   ALKPHOS 77 08/01/2016 0105   BILITOT 0.3 08/01/2016 0105   GFRNONAA 57 (L) 08/02/2016 0344   GFRAA >60 08/02/2016 0344       Component Value Date/Time   WBC 8.6 08/02/2016 0344   WBC 9.9 08/01/2016 0105   WBC 10.0 07/31/2016 1225   HGB 11.3 (L) 08/02/2016 0344   HGB 11.9 (L) 08/01/2016 0105   HGB 12.9 07/31/2016 1228   HCT 35.4 (L) 08/02/2016 0344   HCT 37.8 08/01/2016 0105   HCT 38.0 07/31/2016 1228   MCV 89.6 08/02/2016 0344   MCV 88.7 08/01/2016 0105   MCV 87.8 07/31/2016 1225    Lipid Panel     Component Value Date/Time   CHOL 117 08/01/2016 0105   TRIG 101 08/01/2016 0105   HDL 33 (L) 08/01/2016 0105   CHOLHDL 3.5 08/01/2016 0105   VLDL 20 08/01/2016 0105   LDLCALC 64 08/01/2016 0105    ABG    Component Value Date/Time   TCO2 25 07/31/2016 1228     Lab Results  Component Value Date   TSH 1.490 11/09/2013   BNP (last 3 results) No results for input(s): BNP in the last 8760 hours.  ProBNP (last 3 results) No results for input(s): PROBNP in the last 8760 hours.  Cardiac Panel (last 3 results) No results for input(s): CKTOTAL, CKMB, TROPONINI, RELINDX in the last 72 hours.  Iron/TIBC/Ferritin/ %Sat No results found for: IRON, TIBC, FERRITIN, IRONPCTSAT   EKG Orders placed or performed during  the hospital encounter of 07/31/16  . EKG 12-Lead  . EKG 12-Lead immediately post procedure  . EKG 12-Lead  . EKG 12-Lead immediately post procedure  . EKG 12-Lead  .  EKG 12-Lead  . EKG 12-Lead  . EKG 12-Lead  . EKG 12-Lead     Prior Assessment and Plan Problem List as of 08/12/2016 Reviewed: 07/22/2016  1:36 PM by Sanjuana Kava, MD     Cardiovascular and Mediastinum   Essential hypertension, benign   ST elevation myocardial infarction (STEMI) of inferolateral wall (HCC)   Acute ST elevation myocardial infarction (STEMI) of lateral wall (HCC)   STEMI (ST elevation myocardial infarction) ([place])     Endocrine   Type 2 diabetes mellitus, uncontrolled ([place])     Nervous and Auditory   Meningioma ([place])   Benign meningioma ([place])     Other   Right knee pain   Hyperlipidemia       Imaging: No results found.

## 2016-08-12 NOTE — Progress Notes (Signed)
Cardiology Office Note   Date:  08/12/2016   ID:  Christine, Harvey Dec 05, 1956, MRN [id number]  PCP:  Celedonio Savage, MD  Cardiologist: to be est   Jory Sims, NP   Chief Complaint  Patient presents with  . Coronary Artery Disease  . Hypertension      History of Present Illness: Christine Harvey is a 60 y.o. female who presents for ongoing assessment and management of CAD with recent admission to [facility] on 07/31/2016 in the setting of ST elevation MI of the inferior lateral wall. Patient had cardiac catheterization completed on 07/31/2016.   Mid RCA lesion, 75 %stenosed.  Prox Cx to Mid Cx lesion, 60 %stenosed.  Ost 1st Diag to 1st Diag lesion, 99 %stenosed.  1st Diag lesion, 80 %stenosed.  There is mild left ventricular systolic dysfunction.  The left ventricular ejection fraction is 50-55% by visual estimate   Patient had an unsuccessful attempt at PCI of high-grade first diagonal branch in the setting of high lateral STEMI. This was due to angulation takeoff tortuosity of the vessel and calcification of the lesion. It was noted that she also had a 60% left circumflex lesion and 75% mid RCA lesion. Medical management was recommended and she was started on dual antiplatelet therapy with aspirin and Plavix. She was also started on carvedilol and losartan. She was counseled on dietary regimen and smoking cessation. The patient did not appear to be willing to make significant changes in her lifestyle.  Follow-up echocardiogram demonstrated: Echo showed normal systolic function was normal. There was hypokinesis of the anterolateral myocardium. Dopplerparameters were consistent with abnormal left ventricularrelaxation (grade 1 diastolic dysfunction).  She is here for post hospitalization follow-up. She is without complaint today. She is medically compliant. She has multiple questions concerning her new medication regimen. She is becoming more active and has been  walking. She denies any chest discomfort, bleeding, oozing or fatigue.   Past Medical History:  Diagnosis Date  . Arthritis    osteoarthritis  . 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  some iInvolvement long the Clival bone and Effect upon the Pons - s/p sterotactic radiosurgery in 2014.  . Depression   . Diabetes mellitus   . Hyperlipidemia   . Hypertension   . Morbid obesity ([place])   . Photophobia    With Headaches  . S/P radiation therapy 05/02/13   25 Gy at 5 Gy per fraction, last treatment on 04/30/13  . Tobacco abuse   . Weakness of left leg   . Worsening headaches     Past Surgical History:  Procedure Laterality Date  . CARDIAC CATHETERIZATION N/A 07/31/2016   Procedure: Left Heart Cath and Coronary Angiography;  Surgeon: Lorretta Harp, MD;  Location: [facility];  Service: Cardiovascular;  Laterality: N/A;  . TONSILLECTOMY     As a child     Current Outpatient Prescriptions  Medication Sig Dispense Refill  . acyclovir (ZOVIRAX) 200 MG capsule Take 200 mg by mouth as needed (for cold sores).     Marland Kitchen aspirin EC 81 MG EC tablet Take 1 tablet (81 mg total) by mouth daily.    Marland Kitchen atorvastatin (LIPITOR) 80 MG tablet Take 1 tablet (80 mg total) by mouth daily at 6 PM. 30 tablet 12  . Blood Glucose Monitoring Suppl (ACCU-CHEK AVIVA) device Use as instructed 1 each 0  . carvedilol (COREG) 25 MG  tablet TAKE 1 TABLET BY MOUTH TWICE DAILY WITH A MEAL.  12  . clopidogrel (PLAVIX) 75 MG tablet Take 1 tablet (75 mg total) by mouth daily with breakfast. 30 tablet 12  . glucose blood (ACCU-CHEK AVIVA) test strip Test glucose 4 times a day E11.65 150 each 3  . hydrochlorothiazide (MICROZIDE) 12.5 MG capsule Take 1 capsule (12.5 mg total) by mouth daily. 30 capsule 12  . HYDROcodone-acetaminophen (NORCO) 7.5-325 MG tablet Take 1 tablet by mouth every 4 (four) hours as needed for moderate pain (Must last 30 days.  Do not drive or  operate machinery while taking this medicine.). 120 tablet 0  . Insulin Glargine (LANTUS SOLOSTAR) 100 UNIT/ML Solostar Pen Inject 50 Units into the skin at bedtime.    Marland Kitchen losartan (COZAAR) 100 MG tablet Take 1 tablet (100 mg total) by mouth daily. 30 tablet 12  . metFORMIN (GLUCOPHAGE) 500 MG tablet Take 500 mg by mouth 2 (two) times daily with a meal.    . nitroGLYCERIN (NITROSTAT) 0.4 MG SL tablet Place 1 tablet (0.4 mg total) under the tongue every 5 (five) minutes x 3 doses as needed for chest pain. 25 tablet 2  . potassium chloride SA (KLOR-CON M20) 20 MEQ tablet Take 1 tablet (20 mEq total) by mouth daily. 90 tablet 3   No current facility-administered medications for this visit.     Allergies:   Sulfa antibiotics    Social History:  The patient  reports that she has been smoking Cigarettes.  She has never used smokeless tobacco. She reports that she drinks alcohol. She reports that she does not use drugs.   Family History:  The patient's family history includes CAD in her mother; Cancer in her sister; Hypertension in her father and mother; Stroke in her father.    ROS: All other systems are reviewed and negative. Unless otherwise mentioned in H&P    PHYSICAL EXAM: VS:  BP 138/72   Pulse 68   Ht 5\' 7"  (1.702 m)   Wt 190 lb (86.2 kg)   SpO2 98%   BMI 29.76 kg/m  , BMI Body mass index is 29.76 kg/m. GEN: Well nourished, well developed, in no acute distress  HEENT: normal  Neck: no JVD, carotid bruits, or masses Cardiac: RRR; no murmurs, rubs, or gallops,no edema  Respiratory:  Clear to auscultation bilaterally, normal work of breathing GI: soft, nontender, nondistended, + BS MS: no deformity or atrophy  Skin: warm and dry, no rash. No bruising Neuro:  Strength and sensation are intact Psych: euthymic mood, full affect  Recent Labs: 08/01/2016: ALT 29 08/02/2016: BUN 8; Creatinine, Ser 1.05; Hemoglobin 11.3; Platelets 302; Potassium 3.2; Sodium 139    Lipid Panel     Component Value Date/Time   CHOL 117 08/01/2016 0105   TRIG 101 08/01/2016 0105   HDL 33 (L) 08/01/2016 0105   CHOLHDL 3.5 08/01/2016 0105   VLDL 20 08/01/2016 0105   LDLCALC 64 08/01/2016 0105      Wt Readings from Last 3 Encounters:  08/12/16 190 lb (86.2 kg)  08/03/16 190 lb 9.6 oz (86.5 kg)  07/22/16 187 lb (84.8 kg)    ASSESSMENT AND PLAN:  1.  Coronary artery disease: Status post ST elevation MI of the inferior lateral wall. Cardiac catheterization revealed multivessel disease with an 80% stenosed first diagonal, with ostial first diagonal 99% stenosed. Unsuccessful attempted PCI because of angulation takeoff in tortuosity. Disease was also found in the right coronary artery the proximal circumflex,  nonobstructive with highest amount of stenosis in the mid RCA at 75%. She is being treated with dual antiplatelet therapy ARB and beta blocker.  She is currently asymptomatic. Multiple questions have been answered concerning her medication regimen. I have checked her most recent labs and it revealed that she was hypokalemic with potassium of 3.2. The patient will restart her potassium 20 mEq daily with a follow-up BMET in one week. Goal for patients with heart disease should have a potassium of 4.0.  2. Hypertension: Blood pressure is controlled today on ARB, HCTZ and beta blocker. Continue current medication regimen.  3. Hypercholesterolemia: Continue statin therapy. She is advised to continue walking regimen to help to lose weight and maintain a healthy lifestyle, to help with overall cholesterol status and risk management for known CAD.  Current medicines are reviewed at length with the patient today.    Labs/ tests ordered today include: 3 months   Orders Placed This Encounter  Procedures  . Basic Metabolic Panel (BMET)     Disposition:   FU with 3 months  Signed, Jory Sims, NP  08/12/2016 4:21 PM    [facility] [address],  [place], [place] [address] Phone: [phone]; Fax: [phone]

## 2016-08-12 NOTE — Patient Instructions (Signed)
Your physician recommends that you schedule a follow-up appointment in: 3 Months   Your physician has recommended you make the following change in your medication: Start Potassium 20 mEq Daily   If you need a refill on your cardiac medications before your next appointment, please call your pharmacy.  Your physician recommends that you return for lab work in: 1 Week  Thank you for choosing [facility]!

## 2016-08-18 ENCOUNTER — Other Ambulatory Visit: Payer: Self-pay

## 2016-08-18 MED ORDER — LANCETS MISC: 5 refills | Status: AC

## 2016-08-19 ENCOUNTER — Other Ambulatory Visit (HOSPITAL_COMMUNITY)
Admission: RE | Admit: 2016-08-19 | Discharge: 2016-08-19 | Disposition: A | Discharge status: Home / Self Care | Payer: Medicaid Other | Source: Intra-hospital | Attending: Adult Health | Admitting: Adult Health

## 2016-08-19 DIAGNOSIS — Z79899 Other long term (current) drug therapy: Secondary | ICD-10-CM | POA: Insufficient documentation

## 2016-08-19 LAB — BASIC METABOLIC PANEL
Anion gap: 6 (ref 5–15)
BUN: 10 mg/dL (ref 6–20)
CO2: 26 mmol/L (ref 22–32)
Calcium: 9 mg/dL (ref 8.9–10.3)
Chloride: 108 mmol/L (ref 101–111)
Creatinine, Ser: 1.12 mg/dL — ABNORMAL HIGH (ref 0.44–1.00)
GFR calc Af Amer: >60 mL/min (ref >60)
GFR calc non Af Amer: 52 mL/min — ABNORMAL LOW (ref >60)
Glucose, Bld: 142 mg/dL — ABNORMAL HIGH (ref 65–99)
Potassium: 3.9 mmol/L (ref 3.5–5.1)
Sodium: 140 mmol/L (ref 135–145)

## 2016-08-31 ENCOUNTER — Ambulatory Visit (INDEPENDENT_AMBULATORY_CARE_PROVIDER_SITE_OTHER): Payer: Medicaid Other | Admitting: "Endocrinology

## 2016-08-31 ENCOUNTER — Encounter: Payer: Self-pay | Admitting: "Endocrinology

## 2016-08-31 VITALS — BP 128/79 | HR 67 | Ht 64.0 in | Wt 188.0 lb

## 2016-08-31 DIAGNOSIS — I10 Essential (primary) hypertension: Secondary | ICD-10-CM

## 2016-08-31 DIAGNOSIS — E785 Hyperlipidemia, unspecified: Secondary | ICD-10-CM | POA: Diagnosis not present

## 2016-08-31 DIAGNOSIS — E669 Obesity, unspecified: Secondary | ICD-10-CM

## 2016-08-31 DIAGNOSIS — E1159 Type 2 diabetes mellitus with other circulatory complications: Secondary | ICD-10-CM | POA: Diagnosis not present

## 2016-08-31 MED ORDER — ACCU-CHEK SOFTCLIX LANCET DEV MISC: 0 refills | Status: AC

## 2016-08-31 MED ORDER — METFORMIN HCL 500 MG PO TABS: 500.0000 mg | ORAL_TABLET | Freq: Two times a day (BID) | ORAL | 2 refills | Status: DC

## 2016-08-31 MED ORDER — CANAGLIFLOZIN 100 MG PO TABS: 100.0000 mg | ORAL_TABLET | Freq: Every day | ORAL | 2 refills | Status: DC

## 2016-08-31 NOTE — Patient Instructions (Signed)
Advice for weight management -For most of us the best way to lose weight is by diet management. Generally speaking, diet management means restricting carbohydrate consumption to minimum possible (and to unprocessed or minimally processed complex starch) and increasing protein intake (animal or plant source), fruits, and vegetables.  -Sticking to a routine mealtime to eat 3 meals a day and avoiding unnecessary snacks is shown to have a big role in weight control.  -It is better to avoid simple carbohydrates including: Cakes, Desserts, Ice Cream, Soda (diet and regular), Sweet Tea, Candies, Chips, Cookies, Artificial Sweeteners, and "Sugar-free" Products.   -Exercise: 30 minutes a day 3-4 days a week, or 150 minutes a week. Combine stretch, strength, and aerobic activities. You may seek evaluation by your heart doctor prior to initiating exercise if you have high risk for heart disease.  -If you are interested, we can schedule a visit with Christine Harvey, RDN, CDE for individualized nutrition education.  

## 2016-08-31 NOTE — Progress Notes (Signed)
Subjective:    Patient ID: [name], female    DOB: 15-Mar-1956. Patient is being seen in f/u for management of diabetes requested by  Christine Savage, MD  Past Medical History:  Diagnosis Date  . Arthritis    osteoarthritis  . 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  some iInvolvement long the Clival bone and Effect upon the Pons - s/p sterotactic radiosurgery in 2014.  . Depression   . Diabetes mellitus   . Hyperlipidemia   . Hypertension   . Morbid obesity ([place])   . Photophobia    With Headaches  . S/P radiation therapy 05/02/13   25 Gy at 5 Gy per fraction, last treatment on 04/30/13  . Tobacco abuse   . Weakness of left leg   . Worsening headaches    Past Surgical History:  Procedure Laterality Date  . CARDIAC CATHETERIZATION N/A 07/31/2016   Procedure: Left Heart Cath and Coronary Angiography;  Surgeon: Lorretta Harp, MD;  Location: [facility];  Service: Cardiovascular;  Laterality: N/A;  . TONSILLECTOMY     As a child   Social History   Social History  . Marital status: Single    Spouse name: N/A  . Number of children: N/A  . Years of education: N/A   Social History Main Topics  . Smoking status: Light Tobacco Smoker    Types: Cigarettes  . Smokeless tobacco: Never Used  . Alcohol use Yes     Comment: 2010 -  Alcohol Rehabilitation/ Drinks Occassionally  . Drug use: No  . Sexual activity: Not Currently    Partners: Male   Other Topics Concern  . None   Social History Narrative  . None   Outpatient Encounter Prescriptions as of 08/31/2016  Medication Sig  . acyclovir (ZOVIRAX) 200 MG capsule Take 200 mg by mouth as needed (for cold sores).   Marland Kitchen aspirin EC 81 MG EC tablet Take 1 tablet (81 mg total) by mouth daily.  Marland Kitchen atorvastatin (LIPITOR) 80 MG tablet Take 1 tablet (80 mg total) by mouth daily at 6 PM.  . Blood Glucose Monitoring Suppl (ACCU-CHEK AVIVA) device Use as instructed  .  canagliflozin (INVOKANA) 100 MG TABS tablet Take 1 tablet (100 mg total) by mouth daily before breakfast.  . carvedilol (COREG) 25 MG tablet TAKE 1 TABLET BY MOUTH TWICE DAILY WITH A MEAL.  Marland Kitchen clopidogrel (PLAVIX) 75 MG tablet Take 1 tablet (75 mg total) by mouth daily with breakfast.  . glucose blood (ACCU-CHEK AVIVA) test strip Test glucose 4 times a day E11.65  . HYDROcodone-acetaminophen (NORCO) 7.5-325 MG tablet Take 1 tablet by mouth every 4 (four) hours as needed for moderate pain (Must last 30 days.  Do not drive or operate machinery while taking this medicine.).  . Insulin Glargine (LANTUS SOLOSTAR) 100 UNIT/ML Solostar Pen Inject 50 Units into the skin at bedtime.  Elmore Guise Devices (ACCU-CHEK SOFTCLIX) lancets Use as instructed  . Lancets MISC Test 4 x daily. E11.65. Accu-Chek Aviva  . losartan (COZAAR) 100 MG tablet Take 1 tablet (100 mg total) by mouth daily.  . metFORMIN (GLUCOPHAGE) 500 MG tablet Take 1 tablet (500 mg total) by mouth 2 (two) times daily with a meal.  . nitroGLYCERIN (NITROSTAT) 0.4 MG SL tablet Place 1 tablet (0.4 mg total) under the tongue every 5 (five) minutes x 3 doses as needed for chest pain.  Marland Kitchen  potassium chloride SA (KLOR-CON M20) 20 MEQ tablet Take 1 tablet (20 mEq total) by mouth daily.  . [DISCONTINUED] hydrochlorothiazide (MICROZIDE) 12.5 MG capsule Take 1 capsule (12.5 mg total) by mouth daily.  . [DISCONTINUED] metFORMIN (GLUCOPHAGE) 500 MG tablet Take 500 mg by mouth 2 (two) times daily with a meal.   No facility-administered encounter medications on file as of 08/31/2016.    ALLERGIES: Allergies  Allergen Reactions  . Sulfa Antibiotics Other (See Comments)    Passes out   VACCINATION STATUS:  There is no immunization history on file for this patient.  Diabetes  She presents for her follow-up diabetic visit. She has type 2 diabetes mellitus. Onset time: She was diagnosed at approximately 60 years of age. Her disease course has been improving.  There are no hypoglycemic associated symptoms. Pertinent negatives for hypoglycemia include no confusion, headaches, pallor or seizures. Associated symptoms include fatigue, polydipsia and polyuria. Pertinent negatives for diabetes include no chest pain and no polyphagia. There are no hypoglycemic complications. Symptoms are improving. Diabetic complications include heart disease and nephropathy. Risk factors for coronary artery disease include dyslipidemia, diabetes mellitus, obesity, hypertension, tobacco exposure, sedentary lifestyle and family history. Current diabetic treatments: She is on Lantus 40 units at bedtime and metformin 500 mg by mouth twice a day. She is following a generally unhealthy diet. When asked about meal planning, she reported none. She has not had a previous visit with a dietitian (She will see the dietitian today.). She never participates in exercise. Home blood sugar record trend: She came without her meter nor logs to review today. Her A1c has slightly improved to 9%. Unfortunately since her last visit she developed acute coronary artery syndrome which was reportedly unamenable for angioplasty and currently on medical management. An ACE inhibitor/angiotensin II receptor blocker is being taken.  Hypertension  This is a chronic problem. The current episode started more than 1 year ago. Pertinent negatives include no chest pain, headaches, palpitations or shortness of breath. Risk factors for coronary artery disease include smoking/tobacco exposure, diabetes mellitus, dyslipidemia, obesity and sedentary lifestyle. Past treatments include angiotensin blockers.     Review of Systems  Constitutional: Positive for fatigue. Negative for chills, fever and unexpected weight change.  HENT: Negative for trouble swallowing and voice change.   Eyes: Negative for visual disturbance.  Respiratory: Negative for cough, shortness of breath and wheezing.   Cardiovascular: Negative for chest pain,  palpitations and leg swelling.  Gastrointestinal: Negative for diarrhea, nausea and vomiting.  Endocrine: Positive for polydipsia and polyuria. Negative for cold intolerance, heat intolerance and polyphagia.  Musculoskeletal: Negative for arthralgias and myalgias.  Skin: Negative for color change, pallor, rash and wound.  Neurological: Negative for seizures and headaches.  Psychiatric/Behavioral: Negative for confusion and suicidal ideas.    Objective:    BP 128/79   Pulse 67   Ht 5\' 4"  (1.626 m)   Wt 188 lb (85.3 kg)   BMI 32.27 kg/m   Wt Readings from Last 3 Encounters:  08/31/16 188 lb (85.3 kg)  08/12/16 190 lb (86.2 kg)  08/03/16 190 lb 9.6 oz (86.5 kg)    Physical Exam  Constitutional: She is oriented to person, place, and time. She appears well-developed.  HENT:  Head: Normocephalic and atraumatic.  Eyes: EOM are normal.  Neck: Normal range of motion. Neck supple. No tracheal deviation present. No thyromegaly present.  Cardiovascular: Normal rate and regular rhythm.   Pulmonary/Chest: Effort normal and breath sounds normal.  Abdominal: Soft. Bowel  sounds are normal. There is no tenderness. There is no guarding.  Musculoskeletal: Normal range of motion. She exhibits no edema.  Neurological: She is alert and oriented to person, place, and time. She has normal reflexes. No cranial nerve deficit. Coordination normal.  Skin: Skin is warm and dry. No rash noted. No erythema. No pallor.  Psychiatric: She has a normal mood and affect. Judgment normal.    CMP     Component Value Date/Time   NA 140 08/19/2016 1000   K 3.9 08/19/2016 1000   CL 108 08/19/2016 1000   CO2 26 08/19/2016 1000   GLUCOSE 142 (H) 08/19/2016 1000   BUN 10 08/19/2016 1000   BUN 9.5 04/24/2015 0937   CREATININE 1.12 (H) 08/19/2016 1000   CREATININE 1.0 04/24/2015 0937   CALCIUM 9.0 08/19/2016 1000   PROT 8.0 08/01/2016 0105   ALBUMIN 3.1 (L) 08/01/2016 0105   AST 83 (H) 08/01/2016 0105   ALT 29  08/01/2016 0105   ALKPHOS 77 08/01/2016 0105   BILITOT 0.3 08/01/2016 0105   GFRNONAA 52 (L) 08/19/2016 1000   GFRAA >60 08/19/2016 1000     Diabetic Labs (most recent): Lab Results  Component Value Date   HGBA1C 9.0 (H) 07/31/2016   HGBA1C 11.4 05/14/2016   HGBA1C 10.4 01/27/2016    Lipid Panel     Component Value Date/Time   CHOL 117 08/01/2016 0105   TRIG 101 08/01/2016 0105   HDL 33 (L) 08/01/2016 0105   CHOLHDL 3.5 08/01/2016 0105   VLDL 20 08/01/2016 0105   LDLCALC 64 08/01/2016 0105     Assessment & Plan:   1. Uncontrolled type 2 diabetes mellitus with stage 3 chronic kidney disease, with long-term current use of insulin ([place])   - Patient has currently uncontrolled symptomatic type 2 DM since  60 years of age,  with most recent A1c of 9% 11.4%. Recent labs reviewed.   Her diabetes is complicated by stage CKD which is improving, recent acute coronary syndrome on medical management and patient remains at a high risk for more acute and chronic complications of diabetes which include CAD, CVA, CKD, retinopathy, and neuropathy. These are all discussed in detail with the patient.  - I have counseled the patient on diet management and weight loss, by adopting a carbohydrate restricted/protein rich diet.  - Suggestion is made for patient to avoid simple carbohydrates   from their diet including Cakes , Desserts, Ice Cream,  Soda (  diet and regular) , Sweet Tea , Candies,  Chips, Cookies, Artificial Sweeteners,   and "Sugar-free" Products . This will help patient to have stable blood glucose profile and potentially avoid unintended weight gain.  - I encouraged the patient to switch to  unprocessed or minimally processed complex starch and increased protein intake (animal or plant source), fruits, and vegetables.  - Patient is advised to stick to a routine mealtimes to eat 3 meals  a day and avoid unnecessary snacks ( to snack only to correct hypoglycemia).  - The patient  will be scheduled with Jearld Fenton, RDN, CDE for individualized DM education.  - I have approached patient with the following individualized plan to manage diabetes and patient agrees:   - Based on her significant glycemic burden, she will need basal/bolus insulin if she can engage properly for appropriate monitoring. However she still remains hesitant. She did not bring any meter nor log to review today.  - I  will proceed with basal insulin Lantus  50  units QHS,  associated with strict monitoring of glucose  AC and HS.  -Her other option would be premixed insulin NovoLog 70/30 or Humalog 75/25 to use twice a day with breakfast and supper. -Since her renal function is improving, I will initiate metformin 500 mg by mouth twice a day. - To maximize her options of oral medications, I will add Invokana 100 mg by mouth every morning. Side effects and precautions discussed with her.  -Patient is encouraged to call clinic for blood glucose levels less than 70 or above 300 mg /dl.    - Patient will be considered for incretin therapy as appropriate next visit. - Patient specific target  A1c;  LDL, HDL, Triglycerides, and  Waist Circumference were discussed in detail.  2) BP/HTN:  uncontrolled Continue current medications including ACEI/ARB. 3) Lipids/HPL:  controlled LDL at 57.  4)  Weight/Diet: CDE Consult will be initiated , exercise, and detailed carbohydrates information provided.  5) Chronic Care/Health Maintenance:  -Patient is on ACEI/ARB and statin medications and encouraged to continue to follow up with Ophthalmology, Podiatrist at least yearly or according to recommendations, and advised to   quit smoking. I have recommended yearly flu vaccine and pneumonia vaccination at least every 5 years; moderate intensity exercise for up to 150 minutes weekly; and  sleep for at least 7 hours a day.  - 25 minutes of time was spent on the care of this patient , 50% of which was applied for  counseling on diabetes complications and their preventions.  - Patient to bring meter and  blood glucose logs during their next visit.  -Extensive smoking cessation advice and counseling given to her. - I advised patient to maintain close follow up with Christine Savage, MD for primary care needs.  Follow up plan: - Return in about 3 months (around 11/30/2016) for follow up with pre-visit labs, meter, and logs.  Glade Lloyd, MD Phone: [phone]  Fax: [phone]   08/31/2016, 2:10 PM

## 2016-09-07 ENCOUNTER — Telehealth: Payer: Self-pay | Admitting: Orthopaedic Surgery

## 2016-09-07 MED ORDER — HYDROCODONE-ACETAMINOPHEN 7.5-325 MG PO TABS: ORAL_TABLET | ORAL | 0 refills | Status: DC

## 2016-09-07 NOTE — Telephone Encounter (Signed)
Hydrocodone-Acetaminophen  7.5/325mg   Qty 120 Tablets  Patient has Medicaid

## 2016-09-20 ENCOUNTER — Telehealth: Payer: Self-pay | Admitting: Orthopedic Surgery

## 2016-09-20 ENCOUNTER — Other Ambulatory Visit: Payer: Self-pay | Admitting: *Deleted

## 2016-09-20 MED ORDER — HYDROCODONE-ACETAMINOPHEN 7.5-325 MG PO TABS: ORAL_TABLET | ORAL | 0 refills | Status: DC

## 2016-09-20 NOTE — Telephone Encounter (Signed)
Hydrocodone-Acetaminophen 7.5/325mg   Qty 56 Tablets  One every six hours for pain as needed. Do not drive car or operate   machinery while taking this medicine. Must last 14 days.

## 2016-09-21 ENCOUNTER — Encounter (HOSPITAL_COMMUNITY)
Admission: RE | Admit: 2016-09-21 | Discharge: 2016-09-21 | Disposition: A | Discharge status: Home / Self Care | Payer: Medicaid Other | Source: Ambulatory Visit | Attending: Cardiovascular Disease | Admitting: Cardiovascular Disease

## 2016-09-21 VITALS — BP 142/62 | HR 62 | Resp 93 | Ht 64.0 in | Wt 183.2 lb

## 2016-09-21 DIAGNOSIS — I213 ST elevation (STEMI) myocardial infarction of unspecified site: Secondary | ICD-10-CM | POA: Diagnosis not present

## 2016-09-21 NOTE — Progress Notes (Signed)
Cardiac Individual Treatment Plan  Patient Details  Name: Christine Harvey MRN: [id number] Date of Birth: Mar 31, 1956 Referring Provider:   Flowsheet Row CARDIAC REHAB PHASE II ORIENTATION from 09/21/2016 in [place]  Referring Provider  Dr. Gwenlyn Found      Initial Encounter Date:  Flowsheet Row CARDIAC REHAB PHASE II ORIENTATION from 09/21/2016 in [place]  Date  09/21/16  Referring Provider  Dr. Gwenlyn Found      Visit Diagnosis: ST elevation myocardial infarction (STEMI), unspecified artery ([place])  Patient's Home Medications on Admission:  Current Outpatient Prescriptions:  .  calcipotriene-betamethasone (TACLONEX SCALP) external suspension, Apply topically., Disp: , Rfl:  .  clobetasol (TEMOVATE) 0.05 % external solution, Apply to affected areas on scalp once daily. Never to face., Disp: , Rfl:  .  Clobetasol Propionate (CLOBEX) 0.05 % shampoo, APPLY TO SCALP ONCE DAILY IN SHOWER, THEN RINSE., Disp: , Rfl:  .  FLUOCINOLONE ACETONIDE SCALP 0.01 % OIL, APPLY TO SCALP EVERY DAY AS DIRECTED, Disp: , Rfl:  .  acyclovir (ZOVIRAX) 200 MG capsule, Take 200 mg by mouth as needed (for cold sores). , Disp: , Rfl:  .  aspirin EC 81 MG EC tablet, Take 1 tablet (81 mg total) by mouth daily., Disp: , Rfl:  .  atorvastatin (LIPITOR) 80 MG tablet, Take 1 tablet (80 mg total) by mouth daily at 6 PM., Disp: 30 tablet, Rfl: 12 .  Blood Glucose Monitoring Suppl (ACCU-CHEK AVIVA) device, Use as instructed, Disp: 1 each, Rfl: 0 .  canagliflozin (INVOKANA) 100 MG TABS tablet, Take 1 tablet (100 mg total) by mouth daily before breakfast., Disp: 30 tablet, Rfl: 2 .  carvedilol (COREG) 25 MG tablet, TAKE 1 TABLET BY MOUTH TWICE DAILY WITH A MEAL., Disp: , Rfl: 12 .  clopidogrel (PLAVIX) 75 MG tablet, Take 1 tablet (75 mg total) by mouth daily with breakfast., Disp: 30 tablet, Rfl: 12 .  glucose blood (ACCU-CHEK AVIVA) test strip, Test glucose 4 times a day E11.65, Disp:  150 each, Rfl: 3 .  hydrochlorothiazide (HYDRODIURIL) 25 MG tablet, Take 25 mg by mouth., Disp: , Rfl:  .  HYDROcodone-acetaminophen (NORCO) 7.5-325 MG tablet, One every six hours for pain as needed.  Do not drive car or operate machinery while taking this medicine.  Must last 14 days., Disp: 56 tablet, Rfl: 0 .  Insulin Glargine (LANTUS SOLOSTAR) 100 UNIT/ML Solostar Pen, Inject 50 Units into the skin at bedtime., Disp: , Rfl:  .  Lancet Devices (ACCU-CHEK SOFTCLIX) lancets, Use as instructed, Disp: 1 each, Rfl: 0 .  Lancets MISC, Test 4 x daily. E11.65. Accu-Chek Aviva, Disp: 200 each, Rfl: 5 .  losartan (COZAAR) 100 MG tablet, Take 1 tablet (100 mg total) by mouth daily., Disp: 30 tablet, Rfl: 12 .  metFORMIN (GLUCOPHAGE) 500 MG tablet, Take 1 tablet (500 mg total) by mouth 2 (two) times daily with a meal., Disp: 60 tablet, Rfl: 2 .  nitroGLYCERIN (NITROSTAT) 0.4 MG SL tablet, Place 1 tablet (0.4 mg total) under the tongue every 5 (five) minutes x 3 doses as needed for chest pain., Disp: 25 tablet, Rfl: 2 .  potassium chloride SA (KLOR-CON M20) 20 MEQ tablet, Take 1 tablet (20 mEq total) by mouth daily., Disp: 90 tablet, Rfl: 3  Past Medical History: Past Medical History:  Diagnosis Date  . Arthritis    osteoarthritis  . 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  some iInvolvement long the Clival bone and Effect upon the Pons - s/p sterotactic radiosurgery in 2014.  . Depression   . Diabetes mellitus   . Hyperlipidemia   . Hypertension   . Morbid obesity ([place])   . Photophobia    With Headaches  . S/P radiation therapy 05/02/13   25 Gy at 5 Gy per fraction, last treatment on 04/30/13  . Tobacco abuse   . Weakness of left leg   . Worsening headaches     Tobacco Use: History  Smoking Status  . Light Tobacco Smoker  . Types: Cigarettes  Smokeless Tobacco  . Never Used    Labs: Recent Review Flowsheet Data    Labs for ITP Cardiac  and Pulmonary Rehab Latest Ref Rng & Units 06/30/2011 01/27/2016 05/14/2016 07/31/2016 08/01/2016   Cholestrol 0 - 200 mg/dL 102 - - 116 117   LDLCALC 0 - 99 mg/dL 52 - - 60 64   HDL >40 mg/dL 31(L) - - 28(L) 33(L)   Trlycerides <150 mg/dL 96 - - 141 101   Hemoglobin A1c 4.8 - 5.6 % - 10.4 11.4 9.0(H) -   TCO2 0 - 100 mmol/L - - - 25 -      Capillary Blood Glucose: Lab Results  Component Value Date   GLUCAP 154 (H) 08/03/2016   GLUCAP 145 (H) 08/03/2016   GLUCAP 176 (H) 08/02/2016   GLUCAP 206 (H) 08/02/2016   GLUCAP 148 (H) 08/02/2016     Exercise Target Goals: Date: 09/21/16  Exercise Program Goal: Individual exercise prescription set with THRR, safety & activity barriers. Participant demonstrates ability to understand and report RPE using BORG scale, to self-measure pulse accurately, and to acknowledge the importance of the exercise prescription.  Exercise Prescription Goal: Starting with aerobic activity 30 plus minutes a day, 3 days per week for initial exercise prescription. Provide home exercise prescription and guidelines that participant acknowledges understanding prior to discharge.  Activity Barriers & Risk Stratification:     Activity Barriers & Cardiac Risk Stratification - 09/21/16 1622      Activity Barriers & Cardiac Risk Stratification   Activity Barriers None   Cardiac Risk Stratification High      6 Minute Walk:     6 Minute Walk    Row Name 09/21/16 1508         6 Minute Walk   Phase Initial     Distance 700 feet     Walk Time 4.5 minutes     # of Rest Breaks 0     MPH 1.32     METS 2.01     RPE 11     Perceived Dyspnea  13     VO2 Peak 6.55     Symptoms Yes (comment)     Comments Patient had to stop at four miniutes and thirty seconds due to chest pressure 2/10. This pressure subsided after a two minute rest break      Resting HR 62 bpm     Resting BP 142/62     Max Ex. HR 80 bpm     Max Ex. BP 154/64     2 Minute Post BP 138/66         Initial Exercise Prescription:     Initial Exercise Prescription - 09/21/16 1500      Date of Initial Exercise RX and Referring Provider   Date 09/21/16   Referring Provider Dr. Gwenlyn Found     NuStep   Level  2   Watts 15   Minutes 20   METs 1.9     Arm Ergometer   Level 2   Watts 15   Minutes 15   METs 1.9     Prescription Details   Frequency (times per week) 3   Duration Progress to 30 minutes of continuous aerobic without signs/symptoms of physical distress     Intensity   THRR REST +  30   THRR 40-80% of Max Heartrate 101-121-140   Ratings of Perceived Exertion 11-13   Perceived Dyspnea 0-4     Progression   Progression Continue progressive overload as per policy without signs/symptoms or physical distress.     Resistance Training   Training Prescription Yes   Weight 1   Reps 10-12      Perform Capillary Blood Glucose checks as needed.  Exercise Prescription Changes:   Exercise Comments:    Discharge Exercise Prescription (Final Exercise Prescription Changes):   Nutrition:  Target Goals: Understanding of nutrition guidelines, daily intake of sodium 1500mg , cholesterol 200mg , calories 30% from fat and 7% or less from saturated fats, daily to have 5 or more servings of fruits and vegetables.  Biometrics:     Pre Biometrics - 09/21/16 1511      Pre Biometrics   Height 5\' 4"  (1.626 m)   Weight 183 lb 3.2 oz (83.1 kg)   Waist Circumference 38 inches   Hip Circumference 41.5 inches   Waist to Hip Ratio 0.92 %   BMI (Calculated) 31.5   Triceps Skinfold 15 mm   % Body Fat 38.1 %   Grip Strength 55.7 kg   Flexibility 0 in   Single Leg Stand 10 seconds       Nutrition Therapy Plan and Nutrition Goals:   Nutrition Discharge: Rate Your Plate Scores:     Nutrition Assessments - 09/21/16 1630      MEDFICTS Scores   Pre Score 21      Nutrition Goals Re-Evaluation:   Psychosocial: Target Goals: Acknowledge presence or absence of  depression, maximize coping skills, provide positive support system. Participant is able to verbalize types and ability to use techniques and skills needed for reducing stress and depression.  Initial Review & Psychosocial Screening:     Initial Psych Review & Screening - 09/21/16 1633      Initial Review   Current issues with --  Main concern is transportation. she can ride [employer], they are sometimes not reliable.      Family Dynamics   Good Support System? Yes     Barriers   Psychosocial barriers to participate in program There are no identifiable barriers or psychosocial needs.     Screening Interventions   Interventions Encouraged to exercise      Quality of Life Scores:     Quality of Life - 09/21/16 1512      Quality of Life Scores   Health/Function Pre 22.15 %   Socioeconomic Pre 29 %   Psych/Spiritual Pre 30 %   Family Pre 30 %   GLOBAL Pre 26.14 %      PHQ-9: Recent Review Flowsheet Data    Depression screen [facility] 2/9 09/21/2016 05/20/2016 05/12/2016 03/09/2016 02/26/2016   Decreased Interest 0 0 0 0 0   Down, Depressed, Hopeless 0 0 0 0 0   PHQ - 2 Score 0 0 0 0 0   Altered sleeping 0 - - - -   Tired, decreased energy 0 - - - -  Change in appetite 0 - - - -   Feeling bad or failure about yourself  0 - - - -   Trouble concentrating 0 - - - -   Moving slowly or fidgety/restless 0 - - - -   Suicidal thoughts 0 - - - -   PHQ-9 Score 0 - - - -      Psychosocial Evaluation and Intervention:     Psychosocial Evaluation - 09/21/16 1634      Psychosocial Evaluation & Interventions   Interventions Encouraged to exercise with the program and follow exercise prescription   Continued Psychosocial Services Needed No      Psychosocial Re-Evaluation:   Vocational Rehabilitation: Provide vocational rehab assistance to qualifying candidates.   Vocational Rehab Evaluation & Intervention:     Vocational Rehab - 09/21/16 1626      Initial Vocational Rehab  Evaluation & Intervention   Assessment shows need for Vocational Rehabilitation No      Education: Education Goals: Education classes will be provided on a weekly basis, covering required topics. Participant will state understanding/return demonstration of topics presented.  Learning Barriers/Preferences:     Learning Barriers/Preferences - 09/21/16 1626      Learning Barriers/Preferences   Learning Barriers None   Learning Preferences Video;Pictoral      Education Topics: Hypertension, Hypertension Reduction -Define heart disease and high blood pressure. Discus how high blood pressure affects the body and ways to reduce high blood pressure.   Exercise and Your Heart -Discuss why it is important to exercise, the FITT principles of exercise, normal and abnormal responses to exercise, and how to exercise safely.   Angina -Discuss definition of angina, causes of angina, treatment of angina, and how to decrease risk of having angina.   Cardiac Medications -Review what the following cardiac medications are used for, how they affect the body, and side effects that may occur when taking the medications.  Medications include Aspirin, Beta blockers, calcium channel blockers, ACE Inhibitors, angiotensin receptor blockers, diuretics, digoxin, and antihyperlipidemics.   Congestive Heart Failure -Discuss the definition of CHF, how to live with CHF, the signs and symptoms of CHF, and how keep track of weight and sodium intake.   Heart Disease and Intimacy -Discus the effect sexual activity has on the heart, how changes occur during intimacy as we age, and safety during sexual activity.   Smoking Cessation / COPD -Discuss different methods to quit smoking, the health benefits of quitting smoking, and the definition of COPD.   Nutrition I: Fats -Discuss the types of cholesterol, what cholesterol does to the heart, and how cholesterol levels can be controlled.   Nutrition II:  Labels -Discuss the different components of food labels and how to read food label   Heart Parts and Heart Disease -Discuss the anatomy of the heart, the pathway of blood circulation through the heart, and these are affected by heart disease.   Stress I: Signs and Symptoms -Discuss the causes of stress, how stress may lead to anxiety and depression, and ways to limit stress.   Stress II: Relaxation -Discuss different types of relaxation techniques to limit stress.   Warning Signs of Stroke / TIA -Discuss definition of a stroke, what the signs and symptoms are of a stroke, and how to identify when someone is having stroke.   Knowledge Questionnaire Score:     Knowledge Questionnaire Score - 09/21/16 1626      Knowledge Questionnaire Score   Pre Score --  Patient did not  finish pre test.       Core Components/Risk Factors/Patient Goals at Admission:     Personal Goals and Risk Factors at Admission - 09/21/16 1630      Core Components/Risk Factors/Patient Goals on Admission    Weight Management Weight Maintenance   Sedentary Yes   Intervention Provide advice, education, support and counseling about physical activity/exercise needs.;Develop an individualized exercise prescription for aerobic and resistive training based on initial evaluation findings, risk stratification, comorbidities and participant's personal goals.   Expected Outcomes Achievement of increased cardiorespiratory fitness and enhanced flexibility, muscular endurance and strength shown through measurements of functional capacity and personal statement of participant.   Increase Strength and Stamina Yes   Intervention Provide advice, education, support and counseling about physical activity/exercise needs.;Develop an individualized exercise prescription for aerobic and resistive training based on initial evaluation findings, risk stratification, comorbidities and participant's personal goals.   Expected Outcomes  Achievement of increased cardiorespiratory fitness and enhanced flexibility, muscular endurance and strength shown through measurements of functional capacity and personal statement of participant.   Tobacco Cessation Yes   Intervention Advice worker, assist with locating and accessing local/national Quit Smoking programs, and support quit date choice.   Expected Outcomes Short Term: Will demonstrate readiness to quit, by selecting a quit date.   Diabetes Yes   Intervention Provide education about signs/symptoms and action to take for hypo/hyperglycemia.   Expected Outcomes Short Term: Participant verbalizes understanding of the signs/symptoms and immediate care of hyper/hypoglycemia, proper foot care and importance of medication, aerobic/resistive exercise and nutrition plan for blood glucose control.;Long Term: Attainment of HbA1C < 7%.   Personal Goal Breath better, walk more w/o SOB or chest discomfort.   Intervention attend CR 3x week and supplement exercise 2 x week at home.   Expected Outcomes Reach personal goals.       Core Components/Risk Factors/Patient Goals Review:      Goals and Risk Factor Review    Row Name 09/21/16 1633             Core Components/Risk Factors/Patient Goals Review   Personal Goals Review Increase Strength and Stamina;Sedentary;Tobacco Cessation;Diabetes          Core Components/Risk Factors/Patient Goals at Discharge (Final Review):      Goals and Risk Factor Review - 09/21/16 1633      Core Components/Risk Factors/Patient Goals Review   Personal Goals Review Increase Strength and Stamina;Sedentary;Tobacco Cessation;Diabetes      ITP Comments:   Comments: Patient arrived for 1st visit/orientation/education at 1300. Patient was referred to CR by Dr. Gwenlyn Found due to STEMI (I21.3). During orientation advised patient on arrival and appointment times what to wear, what to do before, during and after exercise. Reviewed attendance and  class policy. Talked about inclement weather and class consultation policy. Pt is scheduled to return Cardiac Rehab on 09/27/16 at 1100. Pt was advised to come to class 15 minutes before class starts. Patient was also given instructions on meeting with the dietician and attending the Family Structure classes. Pt is eager to get started. Patient participated in group led stretching techniques, weight training routine with 1 lb hand held weights and resistance bands. Patient was able to complete 4 mins 30 seconds of walk test. Had to stop due to mild chest discomfort 2/10. Discomfort subsided after 2 minute rest. Patient was measured for the equipment. Discussed equipment safety with patient. Took patient pre-anthropometric measurements. Patient was given her pre-test to take home to finish and bring back  when she returns. Patient finished visit at 15:05.

## 2016-09-21 NOTE — Progress Notes (Signed)
Cardiac/Pulmonary Rehab Medication Review by a Pharmacist  Does the patient  feel that his/her medications are working for him/her?  yes  Has the patient been experiencing any side effects to the medications prescribed?  no  Does the patient measure his/her own blood pressure or blood glucose at home?  no   Does the patient have any problems obtaining medications due to transportation or finances?   no  Understanding of regimen: good Understanding of indications: good Potential of compliance: excellent  Pharmacist comments: Pleasant 61 yo female that is coming to [facility] for cardiac rehab. We reviewed her medication list. She had 2 beta blockers, atenolol and coreg, on her list and she was not sure which one she was on. She will bring in her meds to reevaluate at next cardiac rehab. She recognized medications and indications as we discussed them. She monitors her blood glucose but not blood pressure. Suggested purchasing a blood pressure cuff to monitor her BP and HR.  She is tolerating her medicines without any problems.  Thanks for the opportunity to participate in the care of this patient,  Isac Sarna, BS Vena Austria, [place] Pharmacist Pager [phone] 09/21/2016 2:23 PM

## 2016-09-27 ENCOUNTER — Encounter (HOSPITAL_COMMUNITY)
Admission: RE | Admit: 2016-09-27 | Discharge: 2016-09-27 | Disposition: A | Discharge status: Home / Self Care | Payer: Medicaid Other | Source: Ambulatory Visit | Attending: Cardiovascular Disease | Admitting: Cardiovascular Disease

## 2016-09-27 DIAGNOSIS — I213 ST elevation (STEMI) myocardial infarction of unspecified site: Secondary | ICD-10-CM | POA: Diagnosis not present

## 2016-09-27 NOTE — Progress Notes (Signed)
Daily Session Note  Patient Details  Name: HENLI HEY MRN: [id number] Date of Birth: [birth year] Referring Provider:   Flowsheet Row CARDIAC REHAB PHASE II ORIENTATION from 09/21/2016 in [place]  Referring Provider  Dr. Gwenlyn Found      Encounter Date: 09/27/2016  Check In:     Session Check In - 09/27/16 1100      Check-In   Location [facility]   Staff Present Diane Angelina Pih, MS, EP, [facility], Exercise Physiologist;Maryuri Warnke Wynetta Emery, RN, BSN   Supervising physician immediately available to respond to emergencies See telemetry face sheet for immediately available MD   Medication changes reported     No   Fall or balance concerns reported    No   Warm-up and Cool-down Performed as group-led instruction   Resistance Training Performed Yes   VAD Patient? No     Pain Assessment   Currently in Pain? No/denies   Pain Score 0-No pain   Multiple Pain Sites No      Capillary Blood Glucose: No results found for this or any previous visit (from the past 24 hour(s)).   Goals Met:  Independence with exercise equipment Exercise tolerated well No report of cardiac concerns or symptoms Strength training completed today  Goals Unmet:  Not Applicable  Comments: Check out 1200.   Dr. Kate Sable is Medical Director for [facility] Cardiac and Pulmonary Rehab.

## 2016-09-29 ENCOUNTER — Encounter (HOSPITAL_COMMUNITY)
Admission: RE | Admit: 2016-09-29 | Discharge: 2016-09-29 | Disposition: A | Discharge status: Home / Self Care | Payer: Medicaid Other | Source: Ambulatory Visit | Attending: Cardiovascular Disease | Admitting: Cardiovascular Disease

## 2016-09-29 DIAGNOSIS — I213 ST elevation (STEMI) myocardial infarction of unspecified site: Secondary | ICD-10-CM | POA: Diagnosis not present

## 2016-09-29 NOTE — Progress Notes (Signed)
Daily Session Note  Patient Details  Name: Christine Harvey MRN: [id number] Date of Birth: 1956-10-27 Referring Provider:   Flowsheet Row CARDIAC REHAB PHASE II ORIENTATION from 09/21/2016 in [place]  Referring Provider  Dr. Gwenlyn Found      Encounter Date: 09/29/2016  Check In:     Session Check In - 09/29/16 1100      Check-In   Location [facility]   Staff Present Suzanne Boron, BS, EP, Exercise Physiologist;Diane Coad, MS, EP, [facility], Exercise Physiologist   Supervising physician immediately available to respond to emergencies See telemetry face sheet for immediately available MD   Medication changes reported     No   Fall or balance concerns reported    No   Warm-up and Cool-down Performed as group-led instruction   Resistance Training Performed Yes   VAD Patient? No     Pain Assessment   Currently in Pain? No/denies   Pain Score 0-No pain   Multiple Pain Sites No      Capillary Blood Glucose: No results found for this or any previous visit (from the past 24 hour(s)).   Goals Met:  Independence with exercise equipment Exercise tolerated well No report of cardiac concerns or symptoms Strength training completed today  Goals Unmet:  Not Applicable  Comments: Check out 1200   Dr. Kate Sable is Medical Director for [facility] and Pulmonary Rehab.

## 2016-09-30 NOTE — Progress Notes (Signed)
Cardiac Individual Treatment Plan  Patient Details  Name: Christine Harvey MRN: [id number] Date of Birth: [birth year] Referring Provider:   Flowsheet Row CARDIAC REHAB PHASE II ORIENTATION from 09/21/2016 in [facility]  Referring Provider  Dr. Gwenlyn Found      Initial Encounter Date:  Flowsheet Row CARDIAC REHAB PHASE II ORIENTATION from 09/21/2016 in [place]  Date  09/21/16  Referring Provider  Dr. Gwenlyn Found      Visit Diagnosis: ST elevation myocardial infarction (STEMI), unspecified artery ([place])  Patient's Home Medications on Admission:  Current Outpatient Prescriptions:  .  acyclovir (ZOVIRAX) 200 MG capsule, Take 200 mg by mouth as needed (for cold sores). , Disp: , Rfl:  .  aspirin EC 81 MG EC tablet, Take 1 tablet (81 mg total) by mouth daily., Disp: , Rfl:  .  atorvastatin (LIPITOR) 80 MG tablet, Take 1 tablet (80 mg total) by mouth daily at 6 PM., Disp: 30 tablet, Rfl: 12 .  Blood Glucose Monitoring Suppl (ACCU-CHEK AVIVA) device, Use as instructed, Disp: 1 each, Rfl: 0 .  calcipotriene-betamethasone (TACLONEX SCALP) external suspension, Apply topically., Disp: , Rfl:  .  canagliflozin (INVOKANA) 100 MG TABS tablet, Take 1 tablet (100 mg total) by mouth daily before breakfast., Disp: 30 tablet, Rfl: 2 .  carvedilol (COREG) 25 MG tablet, TAKE 1 TABLET BY MOUTH TWICE DAILY WITH A MEAL., Disp: , Rfl: 12 .  clobetasol (TEMOVATE) 0.05 % external solution, Apply to affected areas on scalp once daily. Never to face., Disp: , Rfl:  .  Clobetasol Propionate (CLOBEX) 0.05 % shampoo, APPLY TO SCALP ONCE DAILY IN SHOWER, THEN RINSE., Disp: , Rfl:  .  clopidogrel (PLAVIX) 75 MG tablet, Take 1 tablet (75 mg total) by mouth daily with breakfast., Disp: 30 tablet, Rfl: 12 .  FLUOCINOLONE ACETONIDE SCALP 0.01 % OIL, APPLY TO SCALP EVERY DAY AS DIRECTED, Disp: , Rfl:  .  glucose blood (ACCU-CHEK AVIVA) test strip, Test glucose 4 times a day E11.65, Disp:  150 each, Rfl: 3 .  hydrochlorothiazide (HYDRODIURIL) 25 MG tablet, Take 25 mg by mouth., Disp: , Rfl:  .  HYDROcodone-acetaminophen (NORCO) 7.5-325 MG tablet, One every six hours for pain as needed.  Do not drive car or operate machinery while taking this medicine.  Must last 14 days., Disp: 56 tablet, Rfl: 0 .  Insulin Glargine (LANTUS SOLOSTAR) 100 UNIT/ML Solostar Pen, Inject 50 Units into the skin at bedtime., Disp: , Rfl:  .  Lancet Devices (ACCU-CHEK SOFTCLIX) lancets, Use as instructed, Disp: 1 each, Rfl: 0 .  Lancets MISC, Test 4 x daily. E11.65. Accu-Chek Aviva, Disp: 200 each, Rfl: 5 .  losartan (COZAAR) 100 MG tablet, Take 1 tablet (100 mg total) by mouth daily., Disp: 30 tablet, Rfl: 12 .  metFORMIN (GLUCOPHAGE) 500 MG tablet, Take 1 tablet (500 mg total) by mouth 2 (two) times daily with a meal., Disp: 60 tablet, Rfl: 2 .  nitroGLYCERIN (NITROSTAT) 0.4 MG SL tablet, Place 1 tablet (0.4 mg total) under the tongue every 5 (five) minutes x 3 doses as needed for chest pain., Disp: 25 tablet, Rfl: 2 .  potassium chloride SA (KLOR-CON M20) 20 MEQ tablet, Take 1 tablet (20 mEq total) by mouth daily., Disp: 90 tablet, Rfl: 3  Past Medical History: Past Medical History:  Diagnosis Date  . Arthritis    osteoarthritis  . 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  some iInvolvement long the Clival bone and Effect upon the Pons - s/p sterotactic radiosurgery in 2014.  . Depression   . Diabetes mellitus   . Hyperlipidemia   . Hypertension   . Morbid obesity ([place])   . Photophobia    With Headaches  . S/P radiation therapy 05/02/13   25 Gy at 5 Gy per fraction, last treatment on 04/30/13  . Tobacco abuse   . Weakness of left leg   . Worsening headaches     Tobacco Use: History  Smoking Status  . Light Tobacco Smoker  . Types: Cigarettes  Smokeless Tobacco  . Never Used    Labs: Recent Review Flowsheet Data    Labs for ITP Cardiac  and Pulmonary Rehab Latest Ref Rng & Units 06/30/2011 01/27/2016 05/14/2016 07/31/2016 08/01/2016   Cholestrol 0 - 200 mg/dL 102 - - 116 117   LDLCALC 0 - 99 mg/dL 52 - - 60 64   HDL >40 mg/dL 31(L) - - 28(L) 33(L)   Trlycerides <150 mg/dL 96 - - 141 101   Hemoglobin A1c 4.8 - 5.6 % - 10.4 11.4 9.0(H) -   TCO2 0 - 100 mmol/L - - - 25 -      Capillary Blood Glucose: Lab Results  Component Value Date   GLUCAP 154 (H) 08/03/2016   GLUCAP 145 (H) 08/03/2016   GLUCAP 176 (H) 08/02/2016   GLUCAP 206 (H) 08/02/2016   GLUCAP 148 (H) 08/02/2016     Exercise Target Goals:    Exercise Program Goal: Individual exercise prescription set with THRR, safety & activity barriers. Participant demonstrates ability to understand and report RPE using BORG scale, to self-measure pulse accurately, and to acknowledge the importance of the exercise prescription.  Exercise Prescription Goal: Starting with aerobic activity 30 plus minutes a day, 3 days per week for initial exercise prescription. Provide home exercise prescription and guidelines that participant acknowledges understanding prior to discharge.  Activity Barriers & Risk Stratification:     Activity Barriers & Cardiac Risk Stratification - 09/21/16 1622      Activity Barriers & Cardiac Risk Stratification   Activity Barriers None   Cardiac Risk Stratification High      6 Minute Walk:     6 Minute Walk    Row Name 09/21/16 1508         6 Minute Walk   Phase Initial     Distance 700 feet     Walk Time 4.5 minutes     # of Rest Breaks 0     MPH 1.32     METS 2.01     RPE 11     Perceived Dyspnea  13     VO2 Peak 6.55     Symptoms Yes (comment)     Comments Patient had to stop at four miniutes and thirty seconds due to chest pressure 2/10. This pressure subsided after a two minute rest break      Resting HR 62 bpm     Resting BP 142/62     Max Ex. HR 80 bpm     Max Ex. BP 154/64     2 Minute Post BP 138/66        Initial  Exercise Prescription:     Initial Exercise Prescription - 09/21/16 1500      Date of Initial Exercise RX and Referring Provider   Date 09/21/16   Referring Provider Dr. Gwenlyn Found     NuStep   Level  2   Watts 15   Minutes 20   METs 1.9     Arm Ergometer   Level 2   Watts 15   Minutes 15   METs 1.9     Prescription Details   Frequency (times per week) 3   Duration Progress to 30 minutes of continuous aerobic without signs/symptoms of physical distress     Intensity   THRR REST +  30   THRR 40-80% of Max Heartrate 101-121-140   Ratings of Perceived Exertion 11-13   Perceived Dyspnea 0-4     Progression   Progression Continue progressive overload as per policy without signs/symptoms or physical distress.     Resistance Training   Training Prescription Yes   Weight 1   Reps 10-12      Perform Capillary Blood Glucose checks as needed.  Exercise Prescription Changes:      Exercise Prescription Changes    Row Name 09/29/16 1400             Exercise Review   Progression Yes         Response to Exercise   Blood Pressure (Admit) 110/64       Blood Pressure (Exercise) 138/74       Blood Pressure (Exit) 106/60       Heart Rate (Admit) 66 bpm       Heart Rate (Exercise) 74 bpm       Heart Rate (Exit) 72 bpm       Rating of Perceived Exertion (Exercise) 11       Duration Progress to 30 minutes of continuous aerobic without signs/symptoms of physical distress       Intensity Rest + 30         Progression   Progression Continue progressive overload as per policy without signs/symptoms or physical distress.         Resistance Training   Training Prescription Yes       Weight 2       Reps 10-12         NuStep   Level 2       Watts 12       Minutes 20       METs 3.56         Arm Ergometer   Level 2.3       Watts 3       Minutes 15       METs 2.3         Home Exercise Plan   Plans to continue exercise at Home       Frequency Add 2 additional days to  program exercise sessions.          Exercise Comments:      Exercise Comments    Row Name 09/29/16 1450           Exercise Comments Patient is proggressing well           Discharge Exercise Prescription (Final Exercise Prescription Changes):     Exercise Prescription Changes - 09/29/16 1400      Exercise Review   Progression Yes     Response to Exercise   Blood Pressure (Admit) 110/64   Blood Pressure (Exercise) 138/74   Blood Pressure (Exit) 106/60   Heart Rate (Admit) 66 bpm   Heart Rate (Exercise) 74 bpm   Heart Rate (Exit) 72 bpm   Rating of Perceived Exertion (Exercise) 11   Duration Progress to 30 minutes of continuous aerobic  without signs/symptoms of physical distress   Intensity Rest + 30     Progression   Progression Continue progressive overload as per policy without signs/symptoms or physical distress.     Resistance Training   Training Prescription Yes   Weight 2   Reps 10-12     NuStep   Level 2   Watts 12   Minutes 20   METs 3.56     Arm Ergometer   Level 2.3   Watts 3   Minutes 15   METs 2.3     Home Exercise Plan   Plans to continue exercise at Home   Frequency Add 2 additional days to program exercise sessions.      Nutrition:  Target Goals: Understanding of nutrition guidelines, daily intake of sodium 1500mg , cholesterol 200mg , calories 30% from fat and 7% or less from saturated fats, daily to have 5 or more servings of fruits and vegetables.  Biometrics:     Pre Biometrics - 09/21/16 1511      Pre Biometrics   Height 5\' 4"  (1.626 m)   Weight 183 lb 3.2 oz (83.1 kg)   Waist Circumference 38 inches   Hip Circumference 41.5 inches   Waist to Hip Ratio 0.92 %   BMI (Calculated) 31.5   Triceps Skinfold 15 mm   % Body Fat 38.1 %   Grip Strength 55.7 kg   Flexibility 0 in   Single Leg Stand 10 seconds       Nutrition Therapy Plan and Nutrition Goals:   Nutrition Discharge: Rate Your Plate Scores:      Nutrition Assessments - 09/21/16 1630      MEDFICTS Scores   Pre Score 21      Nutrition Goals Re-Evaluation:   Psychosocial: Target Goals: Acknowledge presence or absence of depression, maximize coping skills, provide positive support system. Participant is able to verbalize types and ability to use techniques and skills needed for reducing stress and depression.  Initial Review & Psychosocial Screening:     Initial Psych Review & Screening - 09/21/16 1633      Initial Review   Current issues with --  Main concern is transportation. she can ride [employer], they are sometimes not reliable.      Family Dynamics   Good Support System? Yes     Barriers   Psychosocial barriers to participate in program There are no identifiable barriers or psychosocial needs.     Screening Interventions   Interventions Encouraged to exercise      Quality of Life Scores:     Quality of Life - 09/21/16 1512      Quality of Life Scores   Health/Function Pre 22.15 %   Socioeconomic Pre 29 %   Psych/Spiritual Pre 30 %   Family Pre 30 %   GLOBAL Pre 26.14 %      PHQ-9: Recent Review Flowsheet Data    Depression screen [facility] 2/9 09/21/2016 05/20/2016 05/12/2016 03/09/2016 02/26/2016   Decreased Interest 0 0 0 0 0   Down, Depressed, Hopeless 0 0 0 0 0   PHQ - 2 Score 0 0 0 0 0   Altered sleeping 0 - - - -   Tired, decreased energy 0 - - - -   Change in appetite 0 - - - -   Feeling bad or failure about yourself  0 - - - -   Trouble concentrating 0 - - - -   Moving slowly or fidgety/restless 0 - - - -  Suicidal thoughts 0 - - - -   PHQ-9 Score 0 - - - -      Psychosocial Evaluation and Intervention:     Psychosocial Evaluation - 09/21/16 1634      Psychosocial Evaluation & Interventions   Interventions Encouraged to exercise with the program and follow exercise prescription   Continued Psychosocial Services Needed No      Psychosocial Re-Evaluation:     Psychosocial Re-Evaluation     [place] Name 09/30/16 1203             Psychosocial Re-Evaluation   Interventions Encouraged to attend Cardiac Rehabilitation for the exercise       Comments Patient's QOL score was 26.14 and her PHQ-9 score was 0. She has no psychosocial issues.        Continued Psychosocial Services Needed No          Vocational Rehabilitation: Provide vocational rehab assistance to qualifying candidates.   Vocational Rehab Evaluation & Intervention:     Vocational Rehab - 09/21/16 1626      Initial Vocational Rehab Evaluation & Intervention   Assessment shows need for Vocational Rehabilitation No      Education: Education Goals: Education classes will be provided on a weekly basis, covering required topics. Participant will state understanding/return demonstration of topics presented.  Learning Barriers/Preferences:     Learning Barriers/Preferences - 09/21/16 1626      Learning Barriers/Preferences   Learning Barriers None   Learning Preferences Video;Pictoral      Education Topics: Hypertension, Hypertension Reduction -Define heart disease and high blood pressure. Discus how high blood pressure affects the body and ways to reduce high blood pressure.   Exercise and Your Heart -Discuss why it is important to exercise, the FITT principles of exercise, normal and abnormal responses to exercise, and how to exercise safely.   Angina -Discuss definition of angina, causes of angina, treatment of angina, and how to decrease risk of having angina.   Cardiac Medications -Review what the following cardiac medications are used for, how they affect the body, and side effects that may occur when taking the medications.  Medications include Aspirin, Beta blockers, calcium channel blockers, ACE Inhibitors, angiotensin receptor blockers, diuretics, digoxin, and antihyperlipidemics.   Congestive Heart Failure -Discuss the definition of CHF, how to live with CHF, the signs and symptoms of  CHF, and how keep track of weight and sodium intake.   Heart Disease and Intimacy -Discus the effect sexual activity has on the heart, how changes occur during intimacy as we age, and safety during sexual activity.   Smoking Cessation / COPD -Discuss different methods to quit smoking, the health benefits of quitting smoking, and the definition of COPD.   Nutrition I: Fats -Discuss the types of cholesterol, what cholesterol does to the heart, and how cholesterol levels can be controlled.   Nutrition II: Labels -Discuss the different components of food labels and how to read food label   Heart Parts and Heart Disease -Discuss the anatomy of the heart, the pathway of blood circulation through the heart, and these are affected by heart disease.   Stress I: Signs and Symptoms -Discuss the causes of stress, how stress may lead to anxiety and depression, and ways to limit stress. Flowsheet Row CARDIAC REHAB PHASE II EXERCISE from 09/29/2016 in [place]  Date  09/29/16  Educator  Russella Dar  Instruction Review Code  2- meets goals/outcomes      Stress II: Relaxation -Discuss different  types of relaxation techniques to limit stress.   Warning Signs of Stroke / TIA -Discuss definition of a stroke, what the signs and symptoms are of a stroke, and how to identify when someone is having stroke.   Knowledge Questionnaire Score:     Knowledge Questionnaire Score - 09/21/16 1626      Knowledge Questionnaire Score   Pre Score --  Patient did not finish pre test.       Core Components/Risk Factors/Patient Goals at Admission:     Personal Goals and Risk Factors at Admission - 09/21/16 1630      Core Components/Risk Factors/Patient Goals on Admission    Weight Management Weight Maintenance   Sedentary Yes   Intervention Provide advice, education, support and counseling about physical activity/exercise needs.;Develop an individualized exercise  prescription for aerobic and resistive training based on initial evaluation findings, risk stratification, comorbidities and participant's personal goals.   Expected Outcomes Achievement of increased cardiorespiratory fitness and enhanced flexibility, muscular endurance and strength shown through measurements of functional capacity and personal statement of participant.   Increase Strength and Stamina Yes   Intervention Provide advice, education, support and counseling about physical activity/exercise needs.;Develop an individualized exercise prescription for aerobic and resistive training based on initial evaluation findings, risk stratification, comorbidities and participant's personal goals.   Expected Outcomes Achievement of increased cardiorespiratory fitness and enhanced flexibility, muscular endurance and strength shown through measurements of functional capacity and personal statement of participant.   Tobacco Cessation Yes   Intervention Advice worker, assist with locating and accessing local/national Quit Smoking programs, and support quit date choice.   Expected Outcomes Short Term: Will demonstrate readiness to quit, by selecting a quit date.   Diabetes Yes   Intervention Provide education about signs/symptoms and action to take for hypo/hyperglycemia.   Expected Outcomes Short Term: Participant verbalizes understanding of the signs/symptoms and immediate care of hyper/hypoglycemia, proper foot care and importance of medication, aerobic/resistive exercise and nutrition plan for blood glucose control.;Long Term: Attainment of HbA1C < 7%.   Personal Goal Breath better, walk more w/o SOB or chest discomfort.   Intervention attend CR 3x week and supplement exercise 2 x week at home.   Expected Outcomes Reach personal goals.       Core Components/Risk Factors/Patient Goals Review:      Goals and Risk Factor Review    Row Name 09/21/16 1633 09/30/16 1200           Core  Components/Risk Factors/Patient Goals Review   Personal Goals Review Increase Strength and Stamina;Sedentary;Tobacco Cessation;Diabetes Weight Management/Obesity;Increase Strength and Stamina;Sedentary;Tobacco Cessation;Other  Walk more w/o SOB.            Review  - Patient has attended 3 sessions. She has lost 1.7 lbs. She is doing well. Will continue to monitor for progress.      Expected Outcomes  - Patient will continue to attend sessions and meet her personal goals.          Core Components/Risk Factors/Patient Goals at Discharge (Final Review):      Goals and Risk Factor Review - 09/30/16 1200      Core Components/Risk Factors/Patient Goals Review   Personal Goals Review Weight Management/Obesity;Increase Strength and Stamina;Sedentary;Tobacco Cessation;Other  Walk more w/o SOB.         Review Patient has attended 3 sessions. She has lost 1.7 lbs. She is doing well. Will continue to monitor for progress.   Expected Outcomes Patient will continue to attend sessions and  meet her personal goals.       ITP Comments:   Comments: 30 Day Review: Patient doing well with program. Will continue to monitor for progress.

## 2016-10-01 ENCOUNTER — Encounter (HOSPITAL_COMMUNITY)
Admission: RE | Admit: 2016-10-01 | Discharge: 2016-10-01 | Disposition: A | Discharge status: Home / Self Care | Payer: Medicaid Other | Source: Ambulatory Visit | Attending: Cardiovascular Disease | Admitting: Cardiovascular Disease

## 2016-10-01 DIAGNOSIS — I213 ST elevation (STEMI) myocardial infarction of unspecified site: Secondary | ICD-10-CM | POA: Diagnosis not present

## 2016-10-01 NOTE — Progress Notes (Signed)
Daily Session Note  Patient Details  Name: ALFREDA HAMMAD MRN: [id number] Date of Birth: 03-07-1956 Referring Provider:   Flowsheet Row CARDIAC REHAB PHASE II ORIENTATION from 09/21/2016 in [place]  Referring Provider  Dr. Gwenlyn Found      Encounter Date: 10/01/2016  Check In:     Session Check In - 10/01/16 1100      Check-In   Location [facility]   Staff Present Diane Angelina Pih, MS, EP, [facility], Exercise Physiologist;Wilkins Elpers Wynetta Emery, RN, BSN   Supervising physician immediately available to respond to emergencies See telemetry face sheet for immediately available MD   Medication changes reported     No   Fall or balance concerns reported    No   Warm-up and Cool-down Performed as group-led instruction   Resistance Training Performed Yes   VAD Patient? No     Pain Assessment   Currently in Pain? No/denies   Pain Score 0-No pain   Multiple Pain Sites No      Capillary Blood Glucose: No results found for this or any previous visit (from the past 24 hour(s)).   Goals Met:  Independence with exercise equipment Exercise tolerated well No report of cardiac concerns or symptoms Strength training completed today  Goals Unmet:  Not Applicable  Comments: Check out 1200.   Dr. Kate Sable is Medical Director for [facility] Cardiac and Pulmonary Rehab.

## 2016-10-04 ENCOUNTER — Encounter (HOSPITAL_COMMUNITY)
Admission: RE | Admit: 2016-10-04 | Discharge: 2016-10-04 | Disposition: A | Discharge status: Home / Self Care | Payer: Medicaid Other | Source: Ambulatory Visit | Attending: Cardiovascular Disease | Admitting: Cardiovascular Disease

## 2016-10-04 DIAGNOSIS — I213 ST elevation (STEMI) myocardial infarction of unspecified site: Secondary | ICD-10-CM

## 2016-10-04 NOTE — Progress Notes (Signed)
Daily Session Note  Patient Details  Name: Christine Harvey MRN: [id number] Date of Birth: 1956-10-11 Referring Provider:   Flowsheet Row CARDIAC REHAB PHASE II ORIENTATION from 09/21/2016 in [place]  Referring Provider  Dr. Gwenlyn Found      Encounter Date: 10/04/2016  Check In:     Session Check In - 10/04/16 1100      Check-In   Location [facility]   Staff Present Finch Costanzo Angelina Pih, MS, EP, [facility], Exercise Physiologist;Debra Wynetta Emery, RN, BSN   Supervising physician immediately available to respond to emergencies See telemetry face sheet for immediately available MD   Medication changes reported     No   Fall or balance concerns reported    No   Warm-up and Cool-down Performed as group-led instruction   Resistance Training Performed Yes   VAD Patient? No     Pain Assessment   Currently in Pain? No/denies   Pain Score 0-No pain   Multiple Pain Sites No      Capillary Blood Glucose: No results found for this or any previous visit (from the past 24 hour(s)).   Goals Met:  Independence with exercise equipment Exercise tolerated well No report of cardiac concerns or symptoms Strength training completed today  Goals Unmet:  Not Applicable  Comments: Check out: 1200   Dr. Kate Sable is Medical Director for [place] and Pulmonary Rehab.

## 2016-10-05 ENCOUNTER — Telehealth: Payer: Self-pay | Admitting: Orthopaedic Surgery

## 2016-10-05 NOTE — Telephone Encounter (Signed)
Patient requests refill  HYDROcodone-acetaminophen (NORCO) 7.5-325 MG tablet 56 tablet 0 09/20/2016  - insurance: Medicaid

## 2016-10-06 ENCOUNTER — Encounter (HOSPITAL_COMMUNITY)
Admission: RE | Admit: 2016-10-06 | Discharge: 2016-10-06 | Disposition: A | Discharge status: Home / Self Care | Payer: Medicaid Other | Source: Ambulatory Visit | Attending: Cardiovascular Disease | Admitting: Cardiovascular Disease

## 2016-10-06 DIAGNOSIS — I213 ST elevation (STEMI) myocardial infarction of unspecified site: Secondary | ICD-10-CM

## 2016-10-06 MED ORDER — HYDROCODONE-ACETAMINOPHEN 7.5-325 MG PO TABS: ORAL_TABLET | ORAL | 0 refills | Status: DC

## 2016-10-06 NOTE — Progress Notes (Signed)
Daily Session Note  Patient Details  Name: Christine Harvey MRN: [id number] Date of Birth: 07/10/1956 Referring Provider:   Flowsheet Row CARDIAC REHAB PHASE II ORIENTATION from 09/21/2016 in [place]  Referring Provider  Dr. Gwenlyn Found      Encounter Date: 10/06/2016  Check In:     Session Check In - 10/06/16 1100      Check-In   Location [facility]   Staff Present Aundra Dubin, RN, BSN;Diane Coad, MS, EP, [facility], Exercise Physiologist;Selwyn Reason Luther Parody, BS, EP, Exercise Physiologist   Supervising physician immediately available to respond to emergencies See telemetry face sheet for immediately available MD   Medication changes reported     No   Fall or balance concerns reported    No   Warm-up and Cool-down Performed as group-led instruction   Resistance Training Performed Yes   VAD Patient? No     Pain Assessment   Currently in Pain? No/denies   Pain Score 0-No pain   Multiple Pain Sites No      Capillary Blood Glucose: No results found for this or any previous visit (from the past 24 hour(s)).   Goals Met:  Independence with exercise equipment Exercise tolerated well No report of cardiac concerns or symptoms Strength training completed today  Goals Unmet:  Not Applicable  Comments: Check out 1200   Dr. Kate Sable is Medical Director for [place] and Pulmonary Rehab.

## 2016-10-08 ENCOUNTER — Encounter (HOSPITAL_COMMUNITY): Payer: Medicaid Other

## 2016-10-11 ENCOUNTER — Encounter (HOSPITAL_COMMUNITY)
Admission: RE | Admit: 2016-10-11 | Discharge: 2016-10-11 | Disposition: A | Discharge status: Home / Self Care | Payer: Medicaid Other | Source: Ambulatory Visit | Attending: Cardiovascular Disease | Admitting: Cardiovascular Disease

## 2016-10-11 DIAGNOSIS — I213 ST elevation (STEMI) myocardial infarction of unspecified site: Secondary | ICD-10-CM | POA: Diagnosis not present

## 2016-10-11 NOTE — Progress Notes (Signed)
Daily Session Note  Patient Details  Name: Christine Harvey MRN: [id number] Date of Birth: [birth year] Referring Provider:   Flowsheet Row CARDIAC REHAB PHASE II ORIENTATION from 09/21/2016 in [place]  Referring Provider  Dr. Gwenlyn Found      Encounter Date: 10/11/2016  Check In:     Session Check In - 10/11/16 1100      Check-In   Location [facility]   Staff Present Diane Angelina Pih, MS, EP, [facility], Exercise Physiologist;Debra Wynetta Emery, RN, BSN;Dresden Ament, BS, EP, Exercise Physiologist   Supervising physician immediately available to respond to emergencies See telemetry face sheet for immediately available MD   Medication changes reported     No   Fall or balance concerns reported    No   Warm-up and Cool-down Performed as group-led instruction   Resistance Training Performed Yes   VAD Patient? No     Pain Assessment   Currently in Pain? No/denies   Pain Score 0-No pain   Multiple Pain Sites No      Capillary Blood Glucose: No results found for this or any previous visit (from the past 24 hour(s)).   Goals Met:  Independence with exercise equipment Exercise tolerated well No report of cardiac concerns or symptoms Strength training completed today  Goals Unmet:  Not Applicable  Comments: Check out 1200   Dr. Kate Sable is Medical Director for [place] and Pulmonary Rehab.

## 2016-10-13 ENCOUNTER — Encounter (HOSPITAL_COMMUNITY)
Admission: RE | Admit: 2016-10-13 | Discharge: 2016-10-13 | Disposition: A | Discharge status: Home / Self Care | Payer: Medicaid Other | Source: Ambulatory Visit | Attending: Cardiovascular Disease | Admitting: Cardiovascular Disease

## 2016-10-13 DIAGNOSIS — I213 ST elevation (STEMI) myocardial infarction of unspecified site: Secondary | ICD-10-CM | POA: Diagnosis not present

## 2016-10-13 NOTE — Progress Notes (Signed)
Daily Session Note  Patient Details  Name: Christine Harvey MRN: [id number] Date of Birth: 23-Oct-1956 Referring Provider:   Flowsheet Row CARDIAC REHAB PHASE II ORIENTATION from 09/21/2016 in [place]  Referring Provider  Dr. Gwenlyn Found      Encounter Date: 10/13/2016  Check In:     Session Check In - 10/13/16 1100      Check-In   Location [facility]   Staff Present Diane Angelina Pih, MS, EP, [facility], Exercise Physiologist;Gregory Luther Parody, BS, EP, Exercise Physiologist;Mekhi Lascola Wynetta Emery, RN, BSN   Supervising physician immediately available to respond to emergencies See telemetry face sheet for immediately available MD   Medication changes reported     No   Fall or balance concerns reported    No   Warm-up and Cool-down Performed as group-led instruction   Resistance Training Performed Yes   VAD Patient? No     Pain Assessment   Currently in Pain? No/denies   Pain Score 0-No pain   Multiple Pain Sites No      Capillary Blood Glucose: No results found for this or any previous visit (from the past 24 hour(s)).   Goals Met:  Independence with exercise equipment Exercise tolerated well No report of cardiac concerns or symptoms Strength training completed today  Goals Unmet:  Not Applicable  Comments: Check out 1200.   Dr. Kate Sable is Medical Director for [facility] Cardiac and Pulmonary Rehab.

## 2016-10-15 ENCOUNTER — Encounter (HOSPITAL_COMMUNITY)
Admission: RE | Admit: 2016-10-15 | Discharge: 2016-10-15 | Disposition: A | Discharge status: Home / Self Care | Payer: Medicaid Other | Source: Ambulatory Visit | Attending: Cardiovascular Disease | Admitting: Cardiovascular Disease

## 2016-10-15 DIAGNOSIS — I213 ST elevation (STEMI) myocardial infarction of unspecified site: Secondary | ICD-10-CM | POA: Diagnosis not present

## 2016-10-15 NOTE — Progress Notes (Signed)
Daily Session Note  Patient Details  Name: Christine Harvey MRN: [id number] Date of Birth: September 16, 1956 Referring Provider:   Flowsheet Row CARDIAC REHAB PHASE II ORIENTATION from 09/21/2016 in [place]  Referring Provider  Dr. Gwenlyn Found      Encounter Date: 10/15/2016  Check In:     Session Check In - 10/15/16 1100      Check-In   Location [facility]   Staff Present Diane Angelina Pih, MS, EP, [facility], Exercise Physiologist;Brandon Scarbrough Wynetta Emery, RN, BSN   Supervising physician immediately available to respond to emergencies See telemetry face sheet for immediately available MD   Medication changes reported     No   Fall or balance concerns reported    No   Warm-up and Cool-down Performed as group-led instruction   Resistance Training Performed Yes   VAD Patient? No     Pain Assessment   Currently in Pain? No/denies   Pain Score 0-No pain   Multiple Pain Sites No      Capillary Blood Glucose: No results found for this or any previous visit (from the past 24 hour(s)).   Goals Met:  Independence with exercise equipment Exercise tolerated well No report of cardiac concerns or symptoms Strength training completed today  Goals Unmet:  Not Applicable  Comments: Check out 1200.   Dr. Kate Sable is Medical Director for [facility] Cardiac and Pulmonary Rehab.

## 2016-10-18 ENCOUNTER — Encounter (HOSPITAL_COMMUNITY)
Admission: RE | Admit: 2016-10-18 | Discharge: 2016-10-18 | Disposition: A | Discharge status: Home / Self Care | Payer: Medicaid Other | Source: Ambulatory Visit | Attending: Cardiovascular Disease | Admitting: Cardiovascular Disease

## 2016-10-18 DIAGNOSIS — I213 ST elevation (STEMI) myocardial infarction of unspecified site: Secondary | ICD-10-CM

## 2016-10-18 NOTE — Progress Notes (Signed)
Daily Session Note  Patient Details  Name: Christine Harvey MRN: [id number] Date of Birth: 08/13/1956 Referring Provider:   Flowsheet Row CARDIAC REHAB PHASE II ORIENTATION from 09/21/2016 in [place]  Referring Provider  Dr. Gwenlyn Found      Encounter Date: 10/18/2016  Check In:     Session Check In - 10/18/16 1100      Check-In   Location [facility]   Staff Present Diane Angelina Pih, MS, EP, [facility], Exercise Physiologist;Clarissa Laird Luther Parody, BS, EP, Exercise Physiologist   Supervising physician immediately available to respond to emergencies See telemetry face sheet for immediately available MD   Medication changes reported     No   Fall or balance concerns reported    No   Warm-up and Cool-down Performed as group-led instruction   Resistance Training Performed Yes   VAD Patient? No     Pain Assessment   Currently in Pain? No/denies   Pain Score 0-No pain   Multiple Pain Sites No      Capillary Blood Glucose: No results found for this or any previous visit (from the past 24 hour(s)).   Goals Met:  Independence with exercise equipment Exercise tolerated well No report of cardiac concerns or symptoms Strength training completed today  Goals Unmet:  Not Applicable  Comments: Check out 1200   Dr. Kate Sable is Medical Director for [place] and Pulmonary Rehab.

## 2016-10-19 ENCOUNTER — Telehealth: Payer: Self-pay | Admitting: Orthopaedic Surgery

## 2016-10-19 MED ORDER — HYDROCODONE-ACETAMINOPHEN 7.5-325 MG PO TABS: ORAL_TABLET | ORAL | 0 refills | Status: DC

## 2016-10-19 NOTE — Telephone Encounter (Signed)
Patient requests refill On Hydrocodone/Acetaminophen 7.5-325 mgs.   Qty  50   Sig: One every six hours for pain as needed. Do not drive car or operate machinery while taking this medicine. Must last 14 days.

## 2016-10-20 ENCOUNTER — Encounter (HOSPITAL_COMMUNITY)
Admission: RE | Admit: 2016-10-20 | Discharge: 2016-10-20 | Disposition: A | Discharge status: Home / Self Care | Payer: Medicaid Other | Source: Ambulatory Visit | Attending: Cardiovascular Disease | Admitting: Cardiovascular Disease

## 2016-10-20 DIAGNOSIS — I213 ST elevation (STEMI) myocardial infarction of unspecified site: Secondary | ICD-10-CM

## 2016-10-20 NOTE — Progress Notes (Signed)
Daily Session Note  Patient Details  Name: Christine Harvey MRN: [id number] Date of Birth: 1956/07/27 Referring Provider:   Flowsheet Row CARDIAC REHAB PHASE II ORIENTATION from 09/21/2016 in [place]  Referring Provider  Dr. Gwenlyn Found      Encounter Date: 10/20/2016  Check In:     Session Check In - 10/20/16 1100      Check-In   Location [facility]   Staff Present Suzanne Boron, BS, EP, Exercise Physiologist;Farooq Petrovich Wynetta Emery, RN, BSN   Supervising physician immediately available to respond to emergencies See telemetry face sheet for immediately available MD   Medication changes reported     No   Fall or balance concerns reported    No   Warm-up and Cool-down Performed as group-led instruction   Resistance Training Performed Yes   VAD Patient? No     Pain Assessment   Currently in Pain? No/denies   Pain Score 0-No pain   Multiple Pain Sites No      Capillary Blood Glucose: No results found for this or any previous visit (from the past 24 hour(s)).   Goals Met:  Independence with exercise equipment Exercise tolerated well No report of cardiac concerns or symptoms Strength training completed today  Goals Unmet:  Not Applicable  Comments: Check out 1200.   Dr. Kate Sable is Medical Director for [facility] Cardiac and Pulmonary Rehab.

## 2016-10-21 ENCOUNTER — Ambulatory Visit: Payer: Medicaid Other | Admitting: Orthopaedic Surgery

## 2016-10-22 ENCOUNTER — Encounter (HOSPITAL_COMMUNITY): Payer: Medicaid Other

## 2016-10-25 ENCOUNTER — Encounter (HOSPITAL_COMMUNITY): Payer: Medicaid Other

## 2016-10-26 ENCOUNTER — Encounter: Payer: Self-pay | Admitting: Orthopaedic Surgery

## 2016-10-26 ENCOUNTER — Ambulatory Visit (INDEPENDENT_AMBULATORY_CARE_PROVIDER_SITE_OTHER): Payer: Medicaid Other | Admitting: Orthopaedic Surgery

## 2016-10-26 VITALS — BP 131/83 | HR 64 | Temp 97.2°F | Ht 63.0 in | Wt 180.0 lb

## 2016-10-26 DIAGNOSIS — Z720 Tobacco use: Secondary | ICD-10-CM

## 2016-10-26 DIAGNOSIS — G8929 Other chronic pain: Secondary | ICD-10-CM

## 2016-10-26 DIAGNOSIS — M25561 Pain in right knee: Secondary | ICD-10-CM

## 2016-10-26 DIAGNOSIS — I10 Essential (primary) hypertension: Secondary | ICD-10-CM

## 2016-10-26 NOTE — Progress Notes (Signed)
Patient [id number] Christine Harvey, female DOB:1956/10/01, 60 y.o. [id number]  Chief Complaint  Patient presents with  . Follow-up    Right knee pain    HPI  Christine Harvey is a 60 y.o. female who has chronic pain of the right knee. She has no giving way, no locking, no numbness. She has swelling and popping. She is active.  She is a smoker and has cut back to a pack a week.  She is trying to quit.   HPI  Body mass index is 31.89 kg/m.  ROS  Review of Systems  HENT: Negative for congestion.   Eyes: Negative.   Respiratory: Negative for cough and shortness of breath.   Cardiovascular: Negative for chest pain and leg swelling.       Hypertension.  Endocrine:       Diabetes mellitus on insulin  Musculoskeletal: Positive for arthralgias, gait problem and joint swelling.  Neurological: Positive for headaches.       Post benign brain tumor, no residual except slight weakness of the left lower leg at times, varies.    Past Medical History:  Diagnosis Date  . Arthritis    osteoarthritis  . 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  some iInvolvement long the Clival bone and Effect upon the Pons - s/p sterotactic radiosurgery in 2014.  . Depression   . Diabetes mellitus   . Hyperlipidemia   . Hypertension   . Morbid obesity ([place])   . Photophobia    With Headaches  . S/P radiation therapy 05/02/13   25 Gy at 5 Gy per fraction, last treatment on 04/30/13  . Tobacco abuse   . Weakness of left leg   . Worsening headaches     Past Surgical History:  Procedure Laterality Date  . CARDIAC CATHETERIZATION N/A 07/31/2016   Procedure: Left Heart Cath and Coronary Angiography;  Surgeon: Lorretta Harp, MD;  Location: [place] [facility];  Service: Cardiovascular;  Laterality: N/A;  . TONSILLECTOMY     As a child    Family History  Problem Relation Age of Onset  . Hypertension Mother     MI in her 22's to 29's.  . CAD Mother   .  Hypertension Father   . Stroke Father   . Cancer Sister     Social History Social History  Substance Use Topics  . Smoking status: Light Tobacco Smoker    Types: Cigarettes  . Smokeless tobacco: Never Used  . Alcohol use Yes     Comment: 2010 -  Alcohol Rehabilitation/ Drinks Occassionally    Allergies  Allergen Reactions  . Sulfa Antibiotics Other (See Comments)    Passes out    Current Outpatient Prescriptions  Medication Sig Dispense Refill  . acyclovir (ZOVIRAX) 200 MG capsule Take 200 mg by mouth as needed (for cold sores).     Marland Kitchen aspirin EC 81 MG EC tablet Take 1 tablet (81 mg total) by mouth daily.    Marland Kitchen atorvastatin (LIPITOR) 80 MG tablet Take 1 tablet (80 mg total) by mouth daily at 6 PM. 30 tablet 12  . Blood Glucose Monitoring Suppl (ACCU-CHEK AVIVA) device Use as instructed 1 each 0  . calcipotriene-betamethasone (TACLONEX SCALP) external suspension Apply topically.    . canagliflozin (INVOKANA) 100 MG TABS tablet Take 1 tablet (100 mg total) by mouth daily before breakfast. 30 tablet 2  . carvedilol (COREG) 25 MG tablet  TAKE 1 TABLET BY MOUTH TWICE DAILY WITH A MEAL.  12  . clobetasol (TEMOVATE) 0.05 % external solution Apply to affected areas on scalp once daily. Never to face.    . Clobetasol Propionate (CLOBEX) 0.05 % shampoo APPLY TO SCALP ONCE DAILY IN SHOWER, THEN RINSE.    Marland Kitchen clopidogrel (PLAVIX) 75 MG tablet Take 1 tablet (75 mg total) by mouth daily with breakfast. 30 tablet 12  . FLUOCINOLONE ACETONIDE SCALP 0.01 % OIL APPLY TO SCALP EVERY DAY AS DIRECTED    . glucose blood (ACCU-CHEK AVIVA) test strip Test glucose 4 times a day E11.65 150 each 3  . hydrochlorothiazide (HYDRODIURIL) 25 MG tablet Take 25 mg by mouth.    Marland Kitchen HYDROcodone-acetaminophen (NORCO) 7.5-325 MG tablet One every six hours for pain as needed.  Do not drive car or operate machinery while taking this medicine.  Must last 14 days. 40 tablet 0  . Insulin Glargine (LANTUS SOLOSTAR) 100 UNIT/ML  Solostar Pen Inject 50 Units into the skin at bedtime.    Elmore Guise Devices (ACCU-CHEK SOFTCLIX) lancets Use as instructed 1 each 0  . Lancets MISC Test 4 x daily. E11.65. Accu-Chek Aviva 200 each 5  . losartan (COZAAR) 100 MG tablet Take 1 tablet (100 mg total) by mouth daily. 30 tablet 12  . metFORMIN (GLUCOPHAGE) 500 MG tablet Take 1 tablet (500 mg total) by mouth 2 (two) times daily with a meal. 60 tablet 2  . nitroGLYCERIN (NITROSTAT) 0.4 MG SL tablet Place 1 tablet (0.4 mg total) under the tongue every 5 (five) minutes x 3 doses as needed for chest pain. 25 tablet 2  . potassium chloride SA (KLOR-CON M20) 20 MEQ tablet Take 1 tablet (20 mEq total) by mouth daily. 90 tablet 3   No current facility-administered medications for this visit.      Physical Exam  Blood pressure 131/83, pulse 64, temperature 97.2 F (36.2 C), height 5\' 3"  (1.6 m), weight 180 lb (81.6 kg).  Constitutional: overall normal hygiene, normal nutrition, well developed, normal grooming, normal body habitus. Assistive device:none  Musculoskeletal: gait and station Limp right, muscle tone and strength are normal, no tremors or atrophy is present.  .  Neurological: coordination overall normal.  Deep tendon reflex/nerve stretch intact.  Sensation normal.  Cranial nerves II-XII intact.   Skin:   Normal overall no scars, lesions, ulcers or rashes. No psoriasis.  Psychiatric: Alert and oriented x 3.  Recent memory intact, remote memory unclear.  Normal mood and affect. Well groomed.  Good eye contact.  Cardiovascular: overall no swelling, no varicosities, no edema bilaterally, normal temperatures of the legs and arms, no clubbing, cyanosis and good capillary refill.  Lymphatic: palpation is normal.  The right lower extremity is examined:  Inspection:  Thigh:  Non-tender and no defects  Knee has swelling 1+ effusion.                        Joint tenderness is present                        Patient is tender over  the medial joint line  Lower Leg:  Has normal appearance and no tenderness or defects  Ankle:  Non-tender and no defects  Foot:  Non-tender and no defects Range of Motion:  Knee:  Range of motion is: 0-110  Crepitus is  present  Ankle:  Range of motion is normal. Strength and Tone:  The right lower extremity has normal strength and tone. Stability:  Knee:  The knee is stable.  Ankle:  The ankle is stable.    The patient has been educated about the nature of the problem(s) and counseled on treatment options.  The patient appeared to understand what I have discussed and is in agreement with it.  Encounter Diagnoses  Name Primary?  . Chronic pain of right knee Yes  . Essential hypertension, benign   . Nicotine abuse     PLAN Call if any problems.  Precautions discussed.  Continue current medications.   Return to clinic 3 months   Electronically Signed Sanjuana Kava, MD 11/7/20179:59 AM

## 2016-10-27 ENCOUNTER — Encounter (HOSPITAL_COMMUNITY): Payer: Medicaid Other

## 2016-10-29 ENCOUNTER — Encounter (HOSPITAL_COMMUNITY)
Admission: RE | Admit: 2016-10-29 | Discharge: 2016-10-29 | Disposition: A | Discharge status: Home / Self Care | Payer: Medicaid Other | Source: Ambulatory Visit | Attending: Cardiovascular Disease | Admitting: Cardiovascular Disease

## 2016-10-29 DIAGNOSIS — I213 ST elevation (STEMI) myocardial infarction of unspecified site: Secondary | ICD-10-CM | POA: Diagnosis not present

## 2016-10-29 NOTE — Progress Notes (Signed)
Daily Session Note  Patient Details  Name: [name] MRN: [id number] Date of Birth: Sep 14, 1956 Referring Provider:   Flowsheet Row CARDIAC REHAB PHASE II ORIENTATION from 09/21/2016 in [place]  Referring Provider  Dr. Gwenlyn Found      Encounter Date: 10/29/2016  Check In:     Session Check In - 10/29/16 1100      Check-In   Location [facility]   Staff Present Diane Angelina Pih, MS, EP, [facility], Exercise Physiologist;Shammara Jarrett Wynetta Emery, RN, BSN;Gregory Cowan, BS, EP, Exercise Physiologist   Supervising physician immediately available to respond to emergencies See telemetry face sheet for immediately available MD   Medication changes reported     No   Fall or balance concerns reported    No   Warm-up and Cool-down Performed as group-led instruction   Resistance Training Performed Yes   VAD Patient? No     Pain Assessment   Currently in Pain? No/denies   Pain Score 0-No pain   Multiple Pain Sites No      Capillary Blood Glucose: No results found for this or any previous visit (from the past 24 hour(s)).   Goals Met:  Independence with exercise equipment Exercise tolerated well No report of cardiac concerns or symptoms Strength training completed today  Goals Unmet:  Not Applicable  Comments: Check out 1200.   Dr. Kate Sable is Medical Director for [facility] Cardiac and Pulmonary Rehab.

## 2016-11-01 ENCOUNTER — Encounter (HOSPITAL_COMMUNITY): Payer: Medicaid Other

## 2016-11-01 ENCOUNTER — Telehealth: Payer: Self-pay | Admitting: Orthopaedic Surgery

## 2016-11-01 ENCOUNTER — Other Ambulatory Visit: Payer: Self-pay | Admitting: *Deleted

## 2016-11-01 MED ORDER — HYDROCODONE-ACETAMINOPHEN 7.5-325 MG PO TABS: ORAL_TABLET | ORAL | 0 refills | Status: DC

## 2016-11-01 NOTE — Progress Notes (Signed)
Cardiac Individual Treatment Plan  Patient Details  Name: Christine Harvey MRN: [id number] Date of Birth: 20-Jan-1956 Referring Provider:   Flowsheet Row CARDIAC REHAB PHASE II ORIENTATION from 09/21/2016 in [place]  Referring Provider  Dr. Gwenlyn Found      Initial Encounter Date:  Flowsheet Row CARDIAC REHAB PHASE II ORIENTATION from 09/21/2016 in [place]  Date  09/21/16  Referring Provider  Dr. Gwenlyn Found      Visit Diagnosis: ST elevation myocardial infarction (STEMI), unspecified artery ([place])  Patient's Home Medications on Admission:  Current Outpatient Prescriptions:  .  acyclovir (ZOVIRAX) 200 MG capsule, Take 200 mg by mouth as needed (for cold sores). , Disp: , Rfl:  .  aspirin EC 81 MG EC tablet, Take 1 tablet (81 mg total) by mouth daily., Disp: , Rfl:  .  atorvastatin (LIPITOR) 80 MG tablet, Take 1 tablet (80 mg total) by mouth daily at 6 PM., Disp: 30 tablet, Rfl: 12 .  Blood Glucose Monitoring Suppl (ACCU-CHEK AVIVA) device, Use as instructed, Disp: 1 each, Rfl: 0 .  calcipotriene-betamethasone (TACLONEX SCALP) external suspension, Apply topically., Disp: , Rfl:  .  canagliflozin (INVOKANA) 100 MG TABS tablet, Take 1 tablet (100 mg total) by mouth daily before breakfast., Disp: 30 tablet, Rfl: 2 .  carvedilol (COREG) 25 MG tablet, TAKE 1 TABLET BY MOUTH TWICE DAILY WITH A MEAL., Disp: , Rfl: 12 .  clobetasol (TEMOVATE) 0.05 % external solution, Apply to affected areas on scalp once daily. Never to face., Disp: , Rfl:  .  Clobetasol Propionate (CLOBEX) 0.05 % shampoo, APPLY TO SCALP ONCE DAILY IN SHOWER, THEN RINSE., Disp: , Rfl:  .  clopidogrel (PLAVIX) 75 MG tablet, Take 1 tablet (75 mg total) by mouth daily with breakfast., Disp: 30 tablet, Rfl: 12 .  FLUOCINOLONE ACETONIDE SCALP 0.01 % OIL, APPLY TO SCALP EVERY DAY AS DIRECTED, Disp: , Rfl:  .  glucose blood (ACCU-CHEK AVIVA) test strip, Test glucose 4 times a day E11.65, Disp:  150 each, Rfl: 3 .  hydrochlorothiazide (HYDRODIURIL) 25 MG tablet, Take 25 mg by mouth., Disp: , Rfl:  .  HYDROcodone-acetaminophen (NORCO) 7.5-325 MG tablet, One every six hours for pain as needed.  Do not drive car or operate machinery while taking this medicine.  Must last 14 days., Disp: 40 tablet, Rfl: 0 .  Insulin Glargine (LANTUS SOLOSTAR) 100 UNIT/ML Solostar Pen, Inject 50 Units into the skin at bedtime., Disp: , Rfl:  .  Lancet Devices (ACCU-CHEK SOFTCLIX) lancets, Use as instructed, Disp: 1 each, Rfl: 0 .  Lancets MISC, Test 4 x daily. E11.65. Accu-Chek Aviva, Disp: 200 each, Rfl: 5 .  losartan (COZAAR) 100 MG tablet, Take 1 tablet (100 mg total) by mouth daily., Disp: 30 tablet, Rfl: 12 .  metFORMIN (GLUCOPHAGE) 500 MG tablet, Take 1 tablet (500 mg total) by mouth 2 (two) times daily with a meal., Disp: 60 tablet, Rfl: 2 .  nitroGLYCERIN (NITROSTAT) 0.4 MG SL tablet, Place 1 tablet (0.4 mg total) under the tongue every 5 (five) minutes x 3 doses as needed for chest pain., Disp: 25 tablet, Rfl: 2 .  potassium chloride SA (KLOR-CON M20) 20 MEQ tablet, Take 1 tablet (20 mEq total) by mouth daily., Disp: 90 tablet, Rfl: 3  Past Medical History: Past Medical History:  Diagnosis Date  . Arthritis    osteoarthritis  . Brain tumor (benign) ([place]) 7 years   Meningioma  along the Cl[place] - 2.5 x 1.2 x 1.9  cm .  some iInvolvement long the Cl[place] bone and Effect upon the Pons - s/p sterotactic radiosurgery in 2014.  . Depression   . Diabetes mellitus   . Hyperlipidemia   . Hypertension   . Morbid obesity ([place])   . Photophobia    With Headaches  . S/P radiation therapy 05/02/13   25 Gy at 5 Gy per fraction, last treatment on 04/30/13  . Tobacco abuse   . Weakness of left leg   . Worsening headaches     Tobacco Use: History  Smoking Status  . Light Tobacco Smoker  . Types: Cigarettes  Smokeless Tobacco  . Never Used    Labs: Recent Review Flowsheet Data    Labs for ITP Cardiac  and Pulmonary Rehab Latest Ref Rng & Units 06/30/2011 01/27/2016 05/14/2016 07/31/2016 08/01/2016   Cholestrol 0 - 200 mg/dL 102 - - 116 117   LDLCALC 0 - 99 mg/dL 52 - - 60 64   HDL >40 mg/dL 31(L) - - 28(L) 33(L)   Trlycerides <150 mg/dL 96 - - 141 101   Hemoglobin A1c 4.8 - 5.6 % - 10.4 11.4 9.0(H) -   TCO2 0 - 100 mmol/L - - - 25 -      Capillary Blood Glucose: Lab Results  Component Value Date   GLUCAP 154 (H) 08/03/2016   GLUCAP 145 (H) 08/03/2016   GLUCAP 176 (H) 08/02/2016   GLUCAP 206 (H) 08/02/2016   GLUCAP 148 (H) 08/02/2016     Exercise Target Goals:    Exercise Program Goal: Individual exercise prescription set with THRR, safety & activity barriers. Participant demonstrates ability to understand and report RPE using BORG scale, to self-measure pulse accurately, and to acknowledge the importance of the exercise prescription.  Exercise Prescription Goal: Starting with aerobic activity 30 plus minutes a day, 3 days per week for initial exercise prescription. Provide home exercise prescription and guidelines that participant acknowledges understanding prior to discharge.  Activity Barriers & Risk Stratification:     Activity Barriers & Cardiac Risk Stratification - 09/21/16 1622      Activity Barriers & Cardiac Risk Stratification   Activity Barriers None   Cardiac Risk Stratification High      6 Minute Walk:     6 Minute Walk    Row Name 09/21/16 1508         6 Minute Walk   Phase Initial     Distance 700 feet     Walk Time 4.5 minutes     # of Rest Breaks 0     MPH 1.32     METS 2.01     RPE 11     Perceived Dyspnea  13     VO2 Peak 6.55     Symptoms Yes (comment)     Comments Patient had to stop at four miniutes and thirty seconds due to chest pressure 2/10. This pressure subsided after a two minute rest break      Resting HR 62 bpm     Resting BP 142/62     Max Ex. HR 80 bpm     Max Ex. BP 154/64     2 Minute Post BP 138/66        Initial  Exercise Prescription:     Initial Exercise Prescription - 09/21/16 1500      Date of Initial Exercise RX and Referring Provider   Date 09/21/16   Referring Provider Dr. Gwenlyn Found     NuStep   Level  2   Watts 15   Minutes 20   METs 1.9     Arm Ergometer   Level 2   Watts 15   Minutes 15   METs 1.9     Prescription Details   Frequency (times per week) 3   Duration Progress to 30 minutes of continuous aerobic without signs/symptoms of physical distress     Intensity   THRR REST +  30   THRR 40-80% of Max Heartrate 101-121-140   Ratings of Perceived Exertion 11-13   Perceived Dyspnea 0-4     Progression   Progression Continue progressive overload as per policy without signs/symptoms or physical distress.     Resistance Training   Training Prescription Yes   Weight 1   Reps 10-12      Perform Capillary Blood Glucose checks as needed.  Exercise Prescription Changes:      Exercise Prescription Changes    Row Name 09/29/16 1400 10/29/16 1200           Exercise Review   Progression Yes Yes        Response to Exercise   Blood Pressure (Admit) 110/64 136/70      Blood Pressure (Exercise) 138/74 118/52      Blood Pressure (Exit) 106/60 152/88      Heart Rate (Admit) 66 bpm 77 bpm      Heart Rate (Exercise) 74 bpm 64 bpm      Heart Rate (Exit) 72 bpm 47 bpm      Rating of Perceived Exertion (Exercise) 11 9      Duration Progress to 30 minutes of continuous aerobic without signs/symptoms of physical distress Progress to 30 minutes of continuous aerobic without signs/symptoms of physical distress      Intensity Rest + 30 Rest + 30        Progression   Progression Continue progressive overload as per policy without signs/symptoms or physical distress. Continue progressive overload as per policy without signs/symptoms or physical distress.        Resistance Training   Training Prescription Yes Yes      Weight 2 2      Reps 10-12 10-12        NuStep   Level 2 2       Watts 12 16      Minutes 20 20      METs 3.56 2.2        Arm Ergometer   Level 2.3 2.2      Watts 3 3      Minutes 15 15      METs 2.3 2        Home Exercise Plan   Plans to continue exercise at [place] 2 additional days to program exercise sessions. Add 2 additional days to program exercise sessions.         Exercise Comments:      Exercise Comments    Row Name 09/29/16 1450 10/29/16 1245         Exercise Comments Patient is proggressing well Patient is progressing but has not been attending regularly           Discharge Exercise Prescription (Final Exercise Prescription Changes):     Exercise Prescription Changes - 10/29/16 1200      Exercise Review   Progression Yes     Response to Exercise   Blood Pressure (Admit) 136/70   Blood Pressure (Exercise) 118/52   Blood  Pressure (Exit) 152/88   Heart Rate (Admit) 77 bpm   Heart Rate (Exercise) 64 bpm   Heart Rate (Exit) 47 bpm   Rating of Perceived Exertion (Exercise) 9   Duration Progress to 30 minutes of continuous aerobic without signs/symptoms of physical distress   Intensity Rest + 30     Progression   Progression Continue progressive overload as per policy without signs/symptoms or physical distress.     Resistance Training   Training Prescription Yes   Weight 2   Reps 10-12     NuStep   Level 2   Watts 16   Minutes 20   METs 2.2     Arm Ergometer   Level 2.2   Watts 3   Minutes 15   METs 2     Home Exercise Plan   Plans to continue exercise at Home   Frequency Add 2 additional days to program exercise sessions.      Nutrition:  Target Goals: Understanding of nutrition guidelines, daily intake of sodium 1500mg , cholesterol 200mg , calories 30% from fat and 7% or less from saturated fats, daily to have 5 or more servings of fruits and vegetables.  Biometrics:     Pre Biometrics - 09/21/16 1511      Pre Biometrics   Height 5\' 4"  (1.626 m)   Weight 183 lb  3.2 oz (83.1 kg)   Waist Circumference 38 inches   Hip Circumference 41.5 inches   Waist to Hip Ratio 0.92 %   BMI (Calculated) 31.5   Triceps Skinfold 15 mm   % Body Fat 38.1 %   Grip Strength 55.7 kg   Flexibility 0 in   Single Leg Stand 10 seconds       Nutrition Therapy Plan and Nutrition Goals:   Nutrition Discharge: Rate Your Plate Scores:     Nutrition Assessments - 09/21/16 1630      MEDFICTS Scores   Pre Score 21      Nutrition Goals Re-Evaluation:   Psychosocial: Target Goals: Acknowledge presence or absence of depression, maximize coping skills, provide positive support system. Participant is able to verbalize types and ability to use techniques and skills needed for reducing stress and depression.  Initial Review & Psychosocial Screening:     Initial Psych Review & Screening - 09/21/16 1633      Initial Review   Current issues with --  Main concern is transportation. she can ride [employer], they are sometimes not reliable.      Family Dynamics   Good Support System? Yes     Barriers   Psychosocial barriers to participate in program There are no identifiable barriers or psychosocial needs.     Screening Interventions   Interventions Encouraged to exercise      Quality of Life Scores:     Quality of Life - 09/21/16 1512      Quality of Life Scores   Health/Function Pre 22.15 %   Socioeconomic Pre 29 %   Psych/Spiritual Pre 30 %   Family Pre 30 %   GLOBAL Pre 26.14 %      PHQ-9: Recent Review Flowsheet Data    Depression screen [facility] 2/9 09/21/2016 05/20/2016 05/12/2016 03/09/2016 02/26/2016   Decreased Interest 0 0 0 0 0   Down, Depressed, Hopeless 0 0 0 0 0   PHQ - 2 Score 0 0 0 0 0   Altered sleeping 0 - - - -   Tired, decreased energy 0 - - - -  Change in appetite 0 - - - -   Feeling bad or failure about yourself  0 - - - -   Trouble concentrating 0 - - - -   Moving slowly or fidgety/restless 0 - - - -   Suicidal thoughts 0 - - - -    PHQ-9 Score 0 - - - -      Psychosocial Evaluation and Intervention:     Psychosocial Evaluation - 09/21/16 1634      Psychosocial Evaluation & Interventions   Interventions Encouraged to exercise with the program and follow exercise prescription   Continued Psychosocial Services Needed No      Psychosocial Re-Evaluation:     Psychosocial Re-Evaluation    [place] Name 09/30/16 1203 11/01/16 1427           Psychosocial Re-Evaluation   Interventions Encouraged to attend Cardiac Rehabilitation for the exercise Encouraged to attend Cardiac Rehabilitation for the exercise      Comments Patient's QOL score was 26.14 and her PHQ-9 score was 0. She has no psychosocial issues.  Patient's QOL score was 26.14 and her PHQ-9 score was 0. She continues to have no psychosocial issues.      Continued Psychosocial Services Needed No No         Vocational Rehabilitation: Provide vocational rehab assistance to qualifying candidates.   Vocational Rehab Evaluation & Intervention:     Vocational Rehab - 09/21/16 1626      Initial Vocational Rehab Evaluation & Intervention   Assessment shows need for Vocational Rehabilitation No      Education: Education Goals: Education classes will be provided on a weekly basis, covering required topics. Participant will state understanding/return demonstration of topics presented.  Learning Barriers/Preferences:     Learning Barriers/Preferences - 09/21/16 1626      Learning Barriers/Preferences   Learning Barriers None   Learning Preferences Video;Pictoral      Education Topics: Hypertension, Hypertension Reduction -Define heart disease and high blood pressure. Discus how high blood pressure affects the body and ways to reduce high blood pressure. Flowsheet Row CARDIAC REHAB PHASE II EXERCISE from 10/20/2016 in [place]  Date  10/20/16  Educator  DC  Instruction Review Code  2- meets goals/outcomes      Exercise  and Your Heart -Discuss why it is important to exercise, the FITT principles of exercise, normal and abnormal responses to exercise, and how to exercise safely.   Angina -Discuss definition of angina, causes of angina, treatment of angina, and how to decrease risk of having angina.   Cardiac Medications -Review what the following cardiac medications are used for, how they affect the body, and side effects that may occur when taking the medications.  Medications include Aspirin, Beta blockers, calcium channel blockers, ACE Inhibitors, angiotensin receptor blockers, diuretics, digoxin, and antihyperlipidemics.   Congestive Heart Failure -Discuss the definition of CHF, how to live with CHF, the signs and symptoms of CHF, and how keep track of weight and sodium intake.   Heart Disease and Intimacy -Discus the effect sexual activity has on the heart, how changes occur during intimacy as we age, and safety during sexual activity.   Smoking Cessation / COPD -Discuss different methods to quit smoking, the health benefits of quitting smoking, and the definition of COPD.   Nutrition I: Fats -Discuss the types of cholesterol, what cholesterol does to the heart, and how cholesterol levels can be controlled.   Nutrition II: Labels -Discuss the different components of food  labels and how to read food label   Heart Parts and Heart Disease -Discuss the anatomy of the heart, the pathway of blood circulation through the heart, and these are affected by heart disease.   Stress I: Signs and Symptoms -Discuss the causes of stress, how stress may lead to anxiety and depression, and ways to limit stress. Flowsheet Row CARDIAC REHAB PHASE II EXERCISE from 10/20/2016 in [place]  Date  09/29/16  Educator  Russella Dar  Instruction Review Code  2- meets goals/outcomes      Stress II: Relaxation -Discuss different types of relaxation techniques to limit stress. Flowsheet Row  CARDIAC REHAB PHASE II EXERCISE from 10/20/2016 in [place]  Date  10/06/16  Educator  Russella Dar  Instruction Review Code  2- meets goals/outcomes      Warning Signs of Stroke / TIA -Discuss definition of a stroke, what the signs and symptoms are of a stroke, and how to identify when someone is having stroke. Flowsheet Row CARDIAC REHAB PHASE II EXERCISE from 10/20/2016 in [place] [facility]  Date  10/13/16  Educator  Russella Dar  Instruction Review Code  2- meets goals/outcomes      Knowledge Questionnaire Score:     Knowledge Questionnaire Score - 09/21/16 1626      Knowledge Questionnaire Score   Pre Score --  Patient did not finish pre test.       Core Components/Risk Factors/Patient Goals at Admission:     Personal Goals and Risk Factors at Admission - 09/21/16 1630      Core Components/Risk Factors/Patient Goals on Admission    Weight Management Weight Maintenance   Sedentary Yes   Intervention Provide advice, education, support and counseling about physical activity/exercise needs.;Develop an individualized exercise prescription for aerobic and resistive training based on initial evaluation findings, risk stratification, comorbidities and participant's personal goals.   Expected Outcomes Achievement of increased cardiorespiratory fitness and enhanced flexibility, muscular endurance and strength shown through measurements of functional capacity and personal statement of participant.   Increase Strength and Stamina Yes   Intervention Provide advice, education, support and counseling about physical activity/exercise needs.;Develop an individualized exercise prescription for aerobic and resistive training based on initial evaluation findings, risk stratification, comorbidities and participant's personal goals.   Expected Outcomes Achievement of increased cardiorespiratory fitness and enhanced flexibility, muscular endurance and strength  shown through measurements of functional capacity and personal statement of participant.   Tobacco Cessation Yes   Intervention Advice worker, assist with locating and accessing local/national Quit Smoking programs, and support quit date choice.   Expected Outcomes Short Term: Will demonstrate readiness to quit, by selecting a quit date.   Diabetes Yes   Intervention Provide education about signs/symptoms and action to take for hypo/hyperglycemia.   Expected Outcomes Short Term: Participant verbalizes understanding of the signs/symptoms and immediate care of hyper/hypoglycemia, proper foot care and importance of medication, aerobic/resistive exercise and nutrition plan for blood glucose control.;Long Term: Attainment of HbA1C < 7%.   Personal Goal Breath better, walk more w/o SOB or chest discomfort.   Intervention attend CR 3x week and supplement exercise 2 x week at home.   Expected Outcomes Reach personal goals.       Core Components/Risk Factors/Patient Goals Review:      Goals and Risk Factor Review    Row Name 09/21/16 1633 09/30/16 1200 11/01/16 1418         Core Components/Risk Factors/Patient Goals Review  Personal Goals Review Increase Strength and Stamina;Sedentary;Tobacco Cessation;Diabetes Weight Management/Obesity;Increase Strength and Stamina;Sedentary;Tobacco Cessation;Other  Walk more w/o SOB.       Weight Management/Obesity;Increase Strength and Stamina;Tobacco Cessation;Sedentary;Other  Breathe better. Walk with decreased SOB.     Review  - Patient has attended 3 sessions. She has lost 1.7 lbs. She is doing well. Will continue to monitor for progress. Patient has attended 12 sessions. She is maintaining her weight. She is getting stronger and her SOB is improving. She continues to smoke.      Expected Outcomes  - Patient will continue to attend sessions and meet her personal goals.  Patient will complete the program meeting her personal goals.          Core Components/Risk Factors/Patient Goals at Discharge (Final Review):      Goals and Risk Factor Review - 11/01/16 1418      Core Components/Risk Factors/Patient Goals Review   Personal Goals Review Weight Management/Obesity;Increase Strength and Stamina;Tobacco Cessation;Sedentary;Other  Breathe better. Walk with decreased SOB.   Review Patient has attended 12 sessions. She is maintaining her weight. She is getting stronger and her SOB is improving. She continues to smoke.    Expected Outcomes Patient will complete the program meeting her personal goals.       ITP Comments:   Comments: ITP 30 Day REVIEW Patient doing well with program. Will continue to monitor for progress.

## 2016-11-01 NOTE — Telephone Encounter (Signed)
Hydrocodone-Acetaminophen 7.5/325mg   Qty 40 Tablets    One every six hours for pain as needed. Do not drive car or operate machinery while taking this medicine. Must last 14 days.

## 2016-11-03 ENCOUNTER — Encounter (HOSPITAL_COMMUNITY)
Admission: RE | Admit: 2016-11-03 | Discharge: 2016-11-03 | Disposition: A | Discharge status: Home / Self Care | Payer: Medicaid Other | Source: Ambulatory Visit | Attending: Cardiovascular Disease | Admitting: Cardiovascular Disease

## 2016-11-03 DIAGNOSIS — I213 ST elevation (STEMI) myocardial infarction of unspecified site: Secondary | ICD-10-CM | POA: Diagnosis not present

## 2016-11-03 NOTE — Progress Notes (Signed)
Daily Session Note  Patient Details  Name: Christine Harvey MRN: [id number] Date of Birth: [birth year] Referring Provider:   Flowsheet Row CARDIAC REHAB PHASE II ORIENTATION from 09/21/2016 in [place]  Referring Provider  Dr. Gwenlyn Found      Encounter Date: 11/03/2016  Check In:     Session Check In - 11/03/16 1100      Check-In   Location [facility]   Staff Present Aundra Dubin, RN, BSN;Christal Lagerstrom Luther Parody, BS, EP, Exercise Physiologist   Supervising physician immediately available to respond to emergencies See telemetry face sheet for immediately available MD   Medication changes reported     No   Fall or balance concerns reported    No   Warm-up and Cool-down Performed as group-led instruction   Resistance Training Performed Yes   VAD Patient? No     Pain Assessment   Currently in Pain? Yes   Pain Score 2    Pain Location Leg   Pain Orientation Left   Pain Descriptors / Indicators Discomfort   Pain Type Acute pain   Pain Onset Today   Pain Frequency Occasional   Multiple Pain Sites No      Capillary Blood Glucose: No results found for this or any previous visit (from the past 24 hour(s)).   Goals Met:  Independence with exercise equipment Exercise tolerated well No report of cardiac concerns or symptoms Strength training completed today  Goals Unmet:  Not Applicable  Comments: Check out 1200   Dr. Kate Sable is Medical Director for [place] and Pulmonary Rehab.

## 2016-11-04 ENCOUNTER — Other Ambulatory Visit: Payer: Self-pay | Admitting: "Endocrinology

## 2016-11-05 ENCOUNTER — Encounter (HOSPITAL_COMMUNITY)
Admission: RE | Admit: 2016-11-05 | Discharge: 2016-11-05 | Disposition: A | Discharge status: Home / Self Care | Payer: Medicaid Other | Source: Ambulatory Visit | Attending: Cardiovascular Disease | Admitting: Cardiovascular Disease

## 2016-11-05 DIAGNOSIS — I213 ST elevation (STEMI) myocardial infarction of unspecified site: Secondary | ICD-10-CM | POA: Diagnosis not present

## 2016-11-05 NOTE — Progress Notes (Signed)
Daily Session Note  Patient Details  Name: Christine Harvey MRN: [id number] Date of Birth: 05-10-1956 Referring Provider:   Flowsheet Row CARDIAC REHAB PHASE II ORIENTATION from 09/21/2016 in [place]  Referring Provider  Dr. Gwenlyn Found      Encounter Date: 11/05/2016  Check In:     Session Check In - 11/05/16 1055      Check-In   Location [facility]   Staff Present Aundra Dubin, RN, BSN;Dajane Valli Luther Parody, BS, EP, Exercise Physiologist   Supervising physician immediately available to respond to emergencies See telemetry face sheet for immediately available MD   Medication changes reported     No   Fall or balance concerns reported    No   Warm-up and Cool-down Performed as group-led instruction   Resistance Training Performed Yes   VAD Patient? No     Pain Assessment   Currently in Pain? No/denies   Pain Score 0-No pain   Multiple Pain Sites No      Capillary Blood Glucose: No results found for this or any previous visit (from the past 24 hour(s)).   Goals Met:  Independence with exercise equipment Exercise tolerated well No report of cardiac concerns or symptoms Strength training completed today  Goals Unmet:  Not Applicable  Comments: Check out 1200   Dr. Kate Sable is Medical Director for [place] and Pulmonary Rehab.

## 2016-11-08 ENCOUNTER — Encounter (HOSPITAL_COMMUNITY)
Admission: RE | Admit: 2016-11-08 | Discharge: 2016-11-08 | Disposition: A | Discharge status: Home / Self Care | Payer: Medicaid Other | Source: Ambulatory Visit | Attending: Cardiovascular Disease | Admitting: Cardiovascular Disease

## 2016-11-08 DIAGNOSIS — I213 ST elevation (STEMI) myocardial infarction of unspecified site: Secondary | ICD-10-CM | POA: Diagnosis not present

## 2016-11-08 NOTE — Progress Notes (Signed)
Daily Session Note  Patient Details  Name: Christine Harvey MRN: [id number] Date of Birth: 06-May-1956 Referring Provider:   Flowsheet Row CARDIAC REHAB PHASE II ORIENTATION from 09/21/2016 in [place]  Referring Provider  Dr. Gwenlyn Found      Encounter Date: 11/08/2016  Check In:     Session Check In - 11/08/16 1100      Check-In   Location [facility]   Staff Present Aundra Dubin, RN, BSN;Yohana Bartha Luther Parody, BS, EP, Exercise Physiologist   Supervising physician immediately available to respond to emergencies See telemetry face sheet for immediately available MD   Medication changes reported     No   Fall or balance concerns reported    No   Warm-up and Cool-down Performed as group-led instruction   Resistance Training Performed Yes   VAD Patient? No     Pain Assessment   Currently in Pain? No/denies   Pain Score 0-No pain   Multiple Pain Sites No      Capillary Blood Glucose: No results found for this or any previous visit (from the past 24 hour(s)).   Goals Met:  Independence with exercise equipment Exercise tolerated well No report of cardiac concerns or symptoms Strength training completed today  Goals Unmet:  Not Applicable  Comments: Check out 1200   Dr. Kate Sable is Medical Director for [place] and Pulmonary Rehab.

## 2016-11-10 ENCOUNTER — Encounter (HOSPITAL_COMMUNITY): Payer: Medicaid Other

## 2016-11-10 ENCOUNTER — Encounter: Payer: Self-pay | Admitting: Cardiology

## 2016-11-10 NOTE — Progress Notes (Deleted)
Cardiology Office Note  Date: 11/10/2016   ID: Christine Harvey, DOB 1956/09/03, MRN [id number]  PCP: Celedonio Savage, MD  Evaluating Cardiologist: Rozann Lesches, MD   No chief complaint on file.   History of Present Illness: Christine Harvey is a 60 y.o. female that I am meeting for the first time in clinic today. She most recently saw Mr. Lawrence NP in August. I reviewed her records and updated the chart. She has a history of inferolateral STEMI back in August of this year at which time cardiac catheterization revealed severe diagonal disease that was unable to be intervened upon as well as more moderate lesions within the circumflex and RCA distribution as outlined below. She was managed medically by Dr. Gwenlyn Found.  Past Medical History:  Diagnosis Date  . Arthritis    osteoarthritis  . 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  some iInvolvement long the Clival bone and Effect upon the Pons - s/p sterotactic radiosurgery in 2014.  Marland Kitchen CAD (coronary artery disease)    80-99% first diagonal (unable to be intervened upon), 60% circumflex, 75% RCA August 2017  . Depression   . Essential hypertension   . Frequent headaches   . Hyperlipidemia   . Morbid obesity ([place])   . Photophobia    With Headaches  . S/P radiation therapy 05/02/13   25 Gy at 5 Gy per fraction, last treatment on 04/30/13  . ST elevation myocardial infarction (STEMI) of inferolateral wall [facility])    August 2017  . Type 2 diabetes mellitus ([place])     Past Surgical History:  Procedure Laterality Date  . CARDIAC CATHETERIZATION N/A 07/31/2016   Procedure: Left Heart Cath and Coronary Angiography;  Surgeon: Lorretta Harp, MD;  Location: [place] [facility];  Service: Cardiovascular;  Laterality: N/A;  . TONSILLECTOMY     As a child    Current Outpatient Prescriptions  Medication Sig Dispense Refill  . acyclovir (ZOVIRAX) 200 MG capsule Take 200 mg by mouth as needed  (for cold sores).     Marland Kitchen aspirin EC 81 MG EC tablet Take 1 tablet (81 mg total) by mouth daily.    Marland Kitchen atorvastatin (LIPITOR) 80 MG tablet Take 1 tablet (80 mg total) by mouth daily at 6 PM. 30 tablet 12  . Blood Glucose Monitoring Suppl (ACCU-CHEK AVIVA) device Use as instructed 1 each 0  . calcipotriene-betamethasone (TACLONEX SCALP) external suspension Apply topically.    . carvedilol (COREG) 25 MG tablet TAKE 1 TABLET BY MOUTH TWICE DAILY WITH A MEAL.  12  . clobetasol (TEMOVATE) 0.05 % external solution Apply to affected areas on scalp once daily. Never to face.    . Clobetasol Propionate (CLOBEX) 0.05 % shampoo APPLY TO SCALP ONCE DAILY IN SHOWER, THEN RINSE.    Marland Kitchen clopidogrel (PLAVIX) 75 MG tablet Take 1 tablet (75 mg total) by mouth daily with breakfast. 30 tablet 12  . FLUOCINOLONE ACETONIDE SCALP 0.01 % OIL APPLY TO SCALP EVERY DAY AS DIRECTED    . glucose blood (ACCU-CHEK AVIVA) test strip Test glucose 4 times a day E11.65 150 each 3  . hydrochlorothiazide (HYDRODIURIL) 25 MG tablet Take 25 mg by mouth.    Marland Kitchen HYDROcodone-acetaminophen (NORCO) 7.5-325 MG tablet One every six hours for pain as needed.  Do not drive car or operate machinery while taking this medicine.  Must last 14 days. 40 tablet 0  .  Insulin Glargine (LANTUS SOLOSTAR) 100 UNIT/ML Solostar Pen Inject 50 Units into the skin at bedtime.    . INVOKANA 100 MG TABS tablet TAKE 1 TABLET BY MOUTH DAILY BEFORE BREAKFAST. 30 tablet 2  . Lancet Devices (ACCU-CHEK SOFTCLIX) lancets Use as instructed 1 each 0  . Lancets MISC Test 4 x daily. E11.65. Accu-Chek Aviva 200 each 5  . losartan (COZAAR) 100 MG tablet Take 1 tablet (100 mg total) by mouth daily. 30 tablet 12  . metFORMIN (GLUCOPHAGE) 500 MG tablet TAKE 1 TABLET BY MOUTH TWICE DAILY WITH A MEAL 60 tablet 2  . nitroGLYCERIN (NITROSTAT) 0.4 MG SL tablet Place 1 tablet (0.4 mg total) under the tongue every 5 (five) minutes x 3 doses as needed for chest pain. 25 tablet 2  . potassium  chloride SA (KLOR-CON M20) 20 MEQ tablet Take 1 tablet (20 mEq total) by mouth daily. 90 tablet 3   No current facility-administered medications for this visit.    Allergies:  Sulfa antibiotics   Social History: The patient  reports that she has been smoking Cigarettes.  She has never used smokeless tobacco. She reports that she drinks alcohol. She reports that she does not use drugs.   Family History: The patient's family history includes CAD in her mother; Cancer in her sister; Hypertension in her father and mother; Stroke in her father.   ROS:  Please see the history of present illness. Otherwise, complete review of systems is positive for {NONE DEFAULTED:18576::"none"}.  All other systems are reviewed and negative.   Physical Exam: VS:  There were no vitals taken for this visit., BMI There is no height or weight on file to calculate BMI.  Wt Readings from Last 3 Encounters:  10/26/16 180 lb (81.6 kg)  09/21/16 183 lb 3.2 oz (83.1 kg)  08/31/16 188 lb (85.3 kg)    General: Patient appears comfortable at rest. HEENT: Conjunctiva and lids normal, oropharynx clear with moist mucosa. Neck: Supple, no elevated JVP or carotid bruits, no thyromegaly. Lungs: Clear to auscultation, nonlabored breathing at rest. Cardiac: Regular rate and rhythm, no S3 or significant systolic murmur, no pericardial rub. Abdomen: Soft, nontender, no hepatomegaly, bowel sounds present, no guarding or rebound. Extremities: No pitting edema, distal pulses 2+. Skin: Warm and dry. Musculoskeletal: No kyphosis. Neuropsychiatric: Alert and oriented x3, affect grossly appropriate.  ECG: I personally reviewed the tracing from A/14/2017 which showed sinus rhythm with nonspecific ST-T changes.  Recent Labwork: 08/01/2016: ALT 29; AST 83 08/02/2016: Hemoglobin 11.3; Platelets 302 08/19/2016: BUN 10; Creatinine, Ser 1.12; Potassium 3.9; Sodium 140     Component Value Date/Time   CHOL 117 08/01/2016 0105   TRIG 101  08/01/2016 0105   HDL 33 (L) 08/01/2016 0105   CHOLHDL 3.5 08/01/2016 0105   VLDL 20 08/01/2016 0105   LDLCALC 64 08/01/2016 0105    Other Studies Reviewed Today:  Echocardiogram 08/01/2016: Study Conclusions  - Left ventricle: The cavity size was normal. Systolic function was   normal. Hypokinesis of the anterolateral myocardium. Doppler   parameters are consistent with abnormal left ventricular   relaxation (grade 1 diastolic dysfunction). Doppler parameters   are consistent with high ventricular filling pressure. - Aortic valve: Functionally bicuspid; mildly thickened, mildly   calcified leaflets. Valve mobility was restricted. There was mild   stenosis. There was no regurgitation. Valve area (VTI): 1.69   cm^2. Valve area (Vmax): 1.61 cm^2. Valve area (Vmean): 1.38   cm^2. - Mitral valve: Transvalvular velocity was within the  normal range.   There was no evidence for stenosis. There was trivial   regurgitation. - Left atrium: The atrium was moderately dilated. - Right ventricle: The cavity size was normal. Wall thickness was   normal. Systolic function was normal. - Tricuspid valve: There was trivial regurgitation. - Pulmonary arteries: Systolic pressure was within the normal   range. PA peak pressure: 20 mm Hg (S).  Cardiac catheterization 07/31/2016:  Mid RCA lesion, 75 %stenosed.  Prox Cx to Mid Cx lesion, 60 %stenosed.  Ost 1st Diag to 1st Diag lesion, 99 %stenosed.  1st Diag lesion, 80 %stenosed.  There is mild left ventricular systolic dysfunction.  The left ventricular ejection fraction is 50-55% by visual estimate.  Unsuccessful attempt at PCI of a high-grade high first diagonal branch in the setting of a high lateral STEMI. Because of the angulation takeoff, tortuosity of the vessel and calcification of the lesion unable to cross the lesion successfully and intervene. The patient was pain-free at the end of the procedure. Recommend continued medical  therapy.  Assessment and Plan:   Current medicines were reviewed with the patient today.  No orders of the defined types were placed in this encounter.   Disposition:  Signed, Satira Sark, MD, [facility] 11/10/2016 10:52 AM    [facility] [place]. [address] [place], [place] [address] Phone: [phone]; Fax: [phone]

## 2016-11-12 ENCOUNTER — Encounter (HOSPITAL_COMMUNITY): Payer: Medicaid Other

## 2016-11-15 ENCOUNTER — Ambulatory Visit: Payer: Medicaid Other | Admitting: Cardiology

## 2016-11-15 ENCOUNTER — Encounter (HOSPITAL_COMMUNITY): Payer: Medicaid Other

## 2016-11-15 ENCOUNTER — Telehealth: Payer: Self-pay | Admitting: Orthopaedic Surgery

## 2016-11-15 NOTE — Telephone Encounter (Signed)
Patient called and requested a refill on Hydrocodone/Acetaminophen 7.5-325  Mgs.   Qty  40       Sig: One every six hours for pain as needed. Do not drive car or operate machinery while taking this medicine. Must last 14 days.

## 2016-11-16 MED ORDER — HYDROCODONE-ACETAMINOPHEN 7.5-325 MG PO TABS: ORAL_TABLET | ORAL | 0 refills | Status: DC

## 2016-11-17 ENCOUNTER — Encounter (HOSPITAL_COMMUNITY)
Admission: RE | Admit: 2016-11-17 | Discharge: 2016-11-17 | Disposition: A | Discharge status: Home / Self Care | Payer: Medicaid Other | Source: Ambulatory Visit | Attending: Cardiovascular Disease | Admitting: Cardiovascular Disease

## 2016-11-17 DIAGNOSIS — I213 ST elevation (STEMI) myocardial infarction of unspecified site: Secondary | ICD-10-CM | POA: Diagnosis not present

## 2016-11-17 NOTE — Progress Notes (Signed)
Daily Session Note  Patient Details  Name: Christine Harvey MRN: [id number] Date of Birth: September 19, 1956 Referring Provider:   Flowsheet Row CARDIAC REHAB PHASE II ORIENTATION from 09/21/2016 in [place]  Referring Provider  Dr. Gwenlyn Found      Encounter Date: 11/17/2016  Check In:     Session Check In - 11/17/16 1100      Check-In   Location [facility]   Staff Present Suzanne Boron, BS, EP, Exercise Physiologist;Cherrise Occhipinti Wynetta Emery, RN, BSN   Supervising physician immediately available to respond to emergencies See telemetry face sheet for immediately available MD   Medication changes reported     No   Fall or balance concerns reported    No   Warm-up and Cool-down Performed as group-led instruction   Resistance Training Performed Yes   VAD Patient? No     Pain Assessment   Currently in Pain? No/denies   Pain Score 0-No pain   Multiple Pain Sites No      Capillary Blood Glucose: No results found for this or any previous visit (from the past 24 hour(s)).   Goals Met:  Independence with exercise equipment Exercise tolerated well No report of cardiac concerns or symptoms Strength training completed today  Goals Unmet:  Not Applicable  Comments: Check out 1200.   Dr. Kate Sable is Medical Director for [facility].

## 2016-11-19 ENCOUNTER — Encounter (HOSPITAL_COMMUNITY): Payer: Medicaid Other

## 2016-11-22 ENCOUNTER — Encounter (HOSPITAL_COMMUNITY): Payer: Medicaid Other

## 2016-11-23 ENCOUNTER — Encounter: Payer: Self-pay | Admitting: Cardiology

## 2016-11-24 ENCOUNTER — Other Ambulatory Visit (HOSPITAL_COMMUNITY)
Admission: RE | Admit: 2016-11-24 | Discharge: 2016-11-24 | Disposition: A | Discharge status: Home / Self Care | Payer: Medicaid Other | Source: Ambulatory Visit | Attending: "Endocrinology | Admitting: "Endocrinology

## 2016-11-24 ENCOUNTER — Encounter (HOSPITAL_COMMUNITY)
Admission: RE | Admit: 2016-11-24 | Discharge: 2016-11-24 | Disposition: A | Discharge status: Home / Self Care | Payer: Medicaid Other | Source: Ambulatory Visit | Attending: Cardiovascular Disease | Admitting: Cardiovascular Disease

## 2016-11-24 DIAGNOSIS — I213 ST elevation (STEMI) myocardial infarction of unspecified site: Secondary | ICD-10-CM | POA: Insufficient documentation

## 2016-11-24 DIAGNOSIS — E785 Hyperlipidemia, unspecified: Secondary | ICD-10-CM | POA: Insufficient documentation

## 2016-11-24 LAB — LIPID PANEL
Cholesterol: 90 mg/dL (ref 0–200)
HDL: 33 mg/dL — ABNORMAL LOW (ref >40)
LDL Cholesterol: 30 mg/dL (ref 0–99)
Total CHOL/HDL Ratio: 2.7 RATIO
Triglycerides: 135 mg/dL (ref <150)
VLDL: 27 mg/dL (ref 0–40)

## 2016-11-24 LAB — COMPREHENSIVE METABOLIC PANEL
ALT: 14 U/L (ref 14–54)
AST: 19 U/L (ref 15–41)
Albumin: 3.4 g/dL — ABNORMAL LOW (ref 3.5–5.0)
Alkaline Phosphatase: 92 U/L (ref 38–126)
Anion gap: 7 (ref 5–15)
BUN: 21 mg/dL — ABNORMAL HIGH (ref 6–20)
CO2: 25 mmol/L (ref 22–32)
Calcium: 9.4 mg/dL (ref 8.9–10.3)
Chloride: 102 mmol/L (ref 101–111)
Creatinine, Ser: 1.43 mg/dL — ABNORMAL HIGH (ref 0.44–1.00)
GFR calc Af Amer: 45 mL/min — ABNORMAL LOW (ref >60)
GFR calc non Af Amer: 39 mL/min — ABNORMAL LOW (ref >60)
Glucose, Bld: 417 mg/dL — ABNORMAL HIGH (ref 65–99)
Potassium: 4 mmol/L (ref 3.5–5.1)
Sodium: 134 mmol/L — ABNORMAL LOW (ref 135–145)
Total Bilirubin: 0.4 mg/dL (ref 0.3–1.2)
Total Protein: 8.5 g/dL — ABNORMAL HIGH (ref 6.5–8.1)

## 2016-11-24 NOTE — Progress Notes (Signed)
Incomplete Session Note  Patient Details  Name: Christine Harvey MRN: [id number] Date of Birth: 1956-10-14 Referring Provider:   Flowsheet Row CARDIAC REHAB PHASE II ORIENTATION from 09/21/2016 in [place]  Referring Provider  Dr. Tish Men did not complete her rehab session.  Her glucose was 415. She was advised to contact her MD if level continues to be elevated.

## 2016-11-26 ENCOUNTER — Encounter (HOSPITAL_COMMUNITY): Payer: Medicaid Other

## 2016-11-26 LAB — HEMOGLOBIN A1C
Hgb A1c MFr Bld: 9.4 % — ABNORMAL HIGH (ref 4.8–5.6)
Mean Plasma Glucose: 223 mg/dL

## 2016-11-29 ENCOUNTER — Telehealth: Payer: Self-pay | Admitting: Orthopaedic Surgery

## 2016-11-29 ENCOUNTER — Encounter (HOSPITAL_COMMUNITY)
Admission: RE | Admit: 2016-11-29 | Discharge: 2016-11-29 | Disposition: A | Discharge status: Home / Self Care | Payer: Medicaid Other | Source: Ambulatory Visit | Attending: Cardiovascular Disease | Admitting: Cardiovascular Disease

## 2016-11-29 DIAGNOSIS — I213 ST elevation (STEMI) myocardial infarction of unspecified site: Secondary | ICD-10-CM | POA: Diagnosis present

## 2016-11-29 NOTE — Telephone Encounter (Signed)
Patient requests refill:  HYDROcodone-acetaminophen (NORCO) 7.5-325 MG tablet 35 tablet

## 2016-11-29 NOTE — Progress Notes (Signed)
Daily Session Note  Patient Details  Name: Christine Harvey MRN: [id number] Date of Birth: May 28, 1956 Referring Provider:   Flowsheet Row CARDIAC REHAB PHASE II ORIENTATION from 09/21/2016 in [place]  Referring Provider  Dr. Gwenlyn Found      Encounter Date: 11/29/2016  Check In:     Session Check In - 11/29/16 1100      Check-In   Location [facility]   Staff Present Suzanne Boron, BS, EP, Exercise Physiologist;Debra Wynetta Emery, RN, BSN   Supervising physician immediately available to respond to emergencies See telemetry face sheet for immediately available MD   Medication changes reported     No   Fall or balance concerns reported    No   Warm-up and Cool-down Performed as group-led instruction   Resistance Training Performed Yes   VAD Patient? No     Pain Assessment   Currently in Pain? No/denies   Pain Score 0-No pain   Multiple Pain Sites No      Capillary Blood Glucose: No results found for this or any previous visit (from the past 24 hour(s)).   Goals Met:  Independence with exercise equipment Exercise tolerated well No report of cardiac concerns or symptoms Strength training completed today  Goals Unmet:  Not Applicable  Comments: Check out 1200   Dr. Kate Sable is Medical Director for [facility] and [facility].

## 2016-11-30 MED ORDER — HYDROCODONE-ACETAMINOPHEN 7.5-325 MG PO TABS: ORAL_TABLET | ORAL | 0 refills | Status: DC

## 2016-12-01 ENCOUNTER — Encounter (HOSPITAL_COMMUNITY)
Admission: RE | Admit: 2016-12-01 | Discharge: 2016-12-01 | Disposition: A | Discharge status: Home / Self Care | Payer: Medicaid Other | Source: Ambulatory Visit | Attending: Cardiovascular Disease | Admitting: Cardiovascular Disease

## 2016-12-01 DIAGNOSIS — I213 ST elevation (STEMI) myocardial infarction of unspecified site: Secondary | ICD-10-CM | POA: Diagnosis not present

## 2016-12-01 NOTE — Progress Notes (Signed)
Cardiac Individual Treatment Plan  Patient Details  Name: Christine Harvey MRN: [id number] Date of Birth: Dec 03, 1956 Referring Provider:   Flowsheet Row CARDIAC REHAB PHASE II ORIENTATION from 09/21/2016 in [facility]  Referring Provider  Dr. Gwenlyn Found      Initial Encounter Date:  Flowsheet Row CARDIAC REHAB PHASE II ORIENTATION from 09/21/2016 in [facility]  Date  09/21/16  Referring Provider  Dr. Gwenlyn Found      Visit Diagnosis: ST elevation myocardial infarction (STEMI), unspecified artery ([place])  Patient's Home Medications on Admission:  Current Outpatient Prescriptions:  .  acyclovir (ZOVIRAX) 200 MG capsule, Take 200 mg by mouth as needed (for cold sores). , Disp: , Rfl:  .  aspirin EC 81 MG EC tablet, Take 1 tablet (81 mg total) by mouth daily., Disp: , Rfl:  .  atorvastatin (LIPITOR) 80 MG tablet, Take 1 tablet (80 mg total) by mouth daily at 6 PM., Disp: 30 tablet, Rfl: 12 .  Blood Glucose Monitoring Suppl (ACCU-CHEK AVIVA) device, Use as instructed, Disp: 1 each, Rfl: 0 .  calcipotriene-betamethasone (TACLONEX SCALP) external suspension, Apply topically., Disp: , Rfl:  .  carvedilol (COREG) 25 MG tablet, TAKE 1 TABLET BY MOUTH TWICE DAILY WITH A MEAL., Disp: , Rfl: 12 .  clobetasol (TEMOVATE) 0.05 % external solution, Apply to affected areas on scalp once daily. Never to face., Disp: , Rfl:  .  Clobetasol Propionate (CLOBEX) 0.05 % shampoo, APPLY TO SCALP ONCE DAILY IN SHOWER, THEN RINSE., Disp: , Rfl:  .  clopidogrel (PLAVIX) 75 MG tablet, Take 1 tablet (75 mg total) by mouth daily with breakfast., Disp: 30 tablet, Rfl: 12 .  FLUOCINOLONE ACETONIDE SCALP 0.01 % OIL, APPLY TO SCALP EVERY DAY AS DIRECTED, Disp: , Rfl:  .  glucose blood (ACCU-CHEK AVIVA) test strip, Test glucose 4 times a day E11.65, Disp: 150 each, Rfl: 3 .  hydrochlorothiazide (HYDRODIURIL) 25 MG tablet, Take 25 mg by mouth., Disp: , Rfl:  .  HYDROcodone-acetaminophen  (NORCO) 7.5-325 MG tablet, One every six hours for pain as needed.  Do not drive car or operate machinery while taking this medicine.  Must last 14 days., Disp: 25 tablet, Rfl: 0 .  Insulin Glargine (LANTUS SOLOSTAR) 100 UNIT/ML Solostar Pen, Inject 50 Units into the skin at bedtime., Disp: , Rfl:  .  INVOKANA 100 MG TABS tablet, TAKE 1 TABLET BY MOUTH DAILY BEFORE BREAKFAST., Disp: 30 tablet, Rfl: 2 .  Lancet Devices (ACCU-CHEK SOFTCLIX) lancets, Use as instructed, Disp: 1 each, Rfl: 0 .  Lancets MISC, Test 4 x daily. E11.65. Accu-Chek Aviva, Disp: 200 each, Rfl: 5 .  losartan (COZAAR) 100 MG tablet, Take 1 tablet (100 mg total) by mouth daily., Disp: 30 tablet, Rfl: 12 .  metFORMIN (GLUCOPHAGE) 500 MG tablet, TAKE 1 TABLET BY MOUTH TWICE DAILY WITH A MEAL, Disp: 60 tablet, Rfl: 2 .  nitroGLYCERIN (NITROSTAT) 0.4 MG SL tablet, Place 1 tablet (0.4 mg total) under the tongue every 5 (five) minutes x 3 doses as needed for chest pain., Disp: 25 tablet, Rfl: 2 .  potassium chloride SA (KLOR-CON M20) 20 MEQ tablet, Take 1 tablet (20 mEq total) by mouth daily., Disp: 90 tablet, Rfl: 3  Past Medical History: Past Medical History:  Diagnosis Date  . Arthritis    osteoarthritis  . 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  some iInvolvement long the Clival bone  and Effect upon the Pons - s/p sterotactic radiosurgery in 2014.  Marland Kitchen CAD (coronary artery disease)    80-99% first diagonal (unable to be intervened upon), 60% circumflex, 75% RCA August 2017  . Depression   . Essential hypertension   . Frequent headaches   . Hyperlipidemia   . Morbid obesity ([place])   . Photophobia    With Headaches  . S/P radiation therapy 05/02/13   25 Gy at 5 Gy per fraction, last treatment on 04/30/13  . ST elevation myocardial infarction (STEMI) of inferolateral wall [facility])    August 2017  . Type 2 diabetes mellitus (HCC)     Tobacco Use: History  Smoking Status  .  Light Tobacco Smoker  . Types: Cigarettes  Smokeless Tobacco  . Never Used    Labs: Recent Review Flowsheet Data    Labs for ITP Cardiac and Pulmonary Rehab Latest Ref Rng & Units 01/27/2016 05/14/2016 07/31/2016 08/01/2016 11/24/2016   Cholestrol 0 - 200 mg/dL - - 116 117 90   LDLCALC 0 - 99 mg/dL - - 60 64 30   HDL >40 mg/dL - - 28(L) 33(L) 33(L)   Trlycerides <150 mg/dL - - 141 101 135   Hemoglobin A1c 4.8 - 5.6 % 10.4 11.4 9.0(H) - 9.4(H)   TCO2 0 - 100 mmol/L - - 25 - -      Capillary Blood Glucose: Lab Results  Component Value Date   GLUCAP 154 (H) 08/03/2016   GLUCAP 145 (H) 08/03/2016   GLUCAP 176 (H) 08/02/2016   GLUCAP 206 (H) 08/02/2016   GLUCAP 148 (H) 08/02/2016     Exercise Target Goals:    Exercise Program Goal: Individual exercise prescription set with THRR, safety & activity barriers. Participant demonstrates ability to understand and report RPE using BORG scale, to self-measure pulse accurately, and to acknowledge the importance of the exercise prescription.  Exercise Prescription Goal: Starting with aerobic activity 30 plus minutes a day, 3 days per week for initial exercise prescription. Provide home exercise prescription and guidelines that participant acknowledges understanding prior to discharge.  Activity Barriers & Risk Stratification:     Activity Barriers & Cardiac Risk Stratification - 09/21/16 1622      Activity Barriers & Cardiac Risk Stratification   Activity Barriers None   Cardiac Risk Stratification High      6 Minute Walk:     6 Minute Walk    Row Name 09/21/16 1508         6 Minute Walk   Phase Initial     Distance 700 feet     Walk Time 4.5 minutes     # of Rest Breaks 0     MPH 1.32     METS 2.01     RPE 11     Perceived Dyspnea  13     VO2 Peak 6.55     Symptoms Yes (comment)     Comments Patient had to stop at four miniutes and thirty seconds due to chest pressure 2/10. This pressure subsided after a two minute rest  break      Resting HR 62 bpm     Resting BP 142/62     Max Ex. HR 80 bpm     Max Ex. BP 154/64     2 Minute Post BP 138/66        Initial Exercise Prescription:     Initial Exercise Prescription - 09/21/16 1500      Date of Initial Exercise  RX and Referring Provider   Date 09/21/16   Referring Provider Dr. Julius Bowels   Level 2   Watts 15   Minutes 20   METs 1.9     Arm Ergometer   Level 2   Watts 15   Minutes 15   METs 1.9     Prescription Details   Frequency (times per week) 3   Duration Progress to 30 minutes of continuous aerobic without signs/symptoms of physical distress     Intensity   THRR REST +  30   THRR 40-80% of Max Heartrate 101-121-140   Ratings of Perceived Exertion 11-13   Perceived Dyspnea 0-4     Progression   Progression Continue progressive overload as per policy without signs/symptoms or physical distress.     Resistance Training   Training Prescription Yes   Weight 1   Reps 10-12      Perform Capillary Blood Glucose checks as needed.  Exercise Prescription Changes:      Exercise Prescription Changes    Row Name 09/29/16 1400 10/29/16 1200 11/25/16 1400         Exercise Review   Progression Yes Yes Yes       Response to Exercise   Blood Pressure (Admit) 110/64 136/70 110/70     Blood Pressure (Exercise) 138/74 118/52 110/72     Blood Pressure (Exit) 106/60 152/88 114/68     Heart Rate (Admit) 66 bpm 77 bpm 73 bpm     Heart Rate (Exercise) 74 bpm 64 bpm 73 bpm     Heart Rate (Exit) 72 bpm 47 bpm 80 bpm     Rating of Perceived Exertion (Exercise) 11 9 9      Duration Progress to 30 minutes of continuous aerobic without signs/symptoms of physical distress Progress to 30 minutes of continuous aerobic without signs/symptoms of physical distress Progress to 30 minutes of continuous aerobic without signs/symptoms of physical distress     Intensity Rest + 30 Rest + 30 Rest + 30       Progression   Progression Continue  progressive overload as per policy without signs/symptoms or physical distress. Continue progressive overload as per policy without signs/symptoms or physical distress. Continue progressive overload as per policy without signs/symptoms or physical distress.       Resistance Training   Training Prescription Yes Yes Yes     Weight 2 2 2      Reps 10-12 10-12 10-12       NuStep   Level 2 2 3      Watts 12 16 13      Minutes 20 20 20      METs 3.56 2.2 3.56       Arm Ergometer   Level 2.3 2.2 2.3     Watts 3 3 2      Minutes 15 15 15      METs 2.3 2 2.3       Home Exercise Plan   Plans to continue exercise at [facility]     Frequency Add 2 additional days to program exercise sessions. Add 2 additional days to program exercise sessions. Add 2 additional days to program exercise sessions.        Exercise Comments:      Exercise Comments    Row Name 09/29/16 1450 10/29/16 1245 11/25/16 1440       Exercise Comments Patient is proggressing well Patient is progressing but has not been attending regularly  Patient is progressing appropriately  Discharge Exercise Prescription (Final Exercise Prescription Changes):     Exercise Prescription Changes - 11/25/16 1400      Exercise Review   Progression Yes     Response to Exercise   Blood Pressure (Admit) 110/70   Blood Pressure (Exercise) 110/72   Blood Pressure (Exit) 114/68   Heart Rate (Admit) 73 bpm   Heart Rate (Exercise) 73 bpm   Heart Rate (Exit) 80 bpm   Rating of Perceived Exertion (Exercise) 9   Duration Progress to 30 minutes of continuous aerobic without signs/symptoms of physical distress   Intensity Rest + 30     Progression   Progression Continue progressive overload as per policy without signs/symptoms or physical distress.     Resistance Training   Training Prescription Yes   Weight 2   Reps 10-12     NuStep   Level 3   Watts 13   Minutes 20   METs 3.56     Arm Ergometer   Level 2.3    Watts 2   Minutes 15   METs 2.3     Home Exercise Plan   Plans to continue exercise at Home   Frequency Add 2 additional days to program exercise sessions.      Nutrition:  Target Goals: Understanding of nutrition guidelines, daily intake of sodium 1500mg , cholesterol 200mg , calories 30% from fat and 7% or less from saturated fats, daily to have 5 or more servings of fruits and vegetables.  Biometrics:     Pre Biometrics - 09/21/16 1511      Pre Biometrics   Height 5\' 4"  (1.626 m)   Weight 183 lb 3.2 oz (83.1 kg)   Waist Circumference 38 inches   Hip Circumference 41.5 inches   Waist to Hip Ratio 0.92 %   BMI (Calculated) 31.5   Triceps Skinfold 15 mm   % Body Fat 38.1 %   Grip Strength 55.7 kg   Flexibility 0 in   Single Leg Stand 10 seconds       Nutrition Therapy Plan and Nutrition Goals:   Nutrition Discharge: Rate Your Plate Scores:     Nutrition Assessments - 09/21/16 1630      MEDFICTS Scores   Pre Score 21      Nutrition Goals Re-Evaluation:   Psychosocial: Target Goals: Acknowledge presence or absence of depression, maximize coping skills, provide positive support system. Participant is able to verbalize types and ability to use techniques and skills needed for reducing stress and depression.  Initial Review & Psychosocial Screening:     Initial Psych Review & Screening - 09/21/16 1633      Initial Review   Current issues with --  Main concern is transportation. she can ride [employer], they are sometimes not reliable.      Family Dynamics   Good Support System? Yes     Barriers   Psychosocial barriers to participate in program There are no identifiable barriers or psychosocial needs.     Screening Interventions   Interventions Encouraged to exercise      Quality of Life Scores:     Quality of Life - 09/21/16 1512      Quality of Life Scores   Health/Function Pre 22.15 %   Socioeconomic Pre 29 %   Psych/Spiritual Pre 30 %    Family Pre 30 %   GLOBAL Pre 26.14 %      PHQ-9: Recent Review Flowsheet Data    Depression screen [facility] 2/9 09/21/2016 05/20/2016 05/12/2016 03/09/2016  02/26/2016   Decreased Interest 0 0 0 0 0   Down, Depressed, Hopeless 0 0 0 0 0   PHQ - 2 Score 0 0 0 0 0   Altered sleeping 0 - - - -   Tired, decreased energy 0 - - - -   Change in appetite 0 - - - -   Feeling bad or failure about yourself  0 - - - -   Trouble concentrating 0 - - - -   Moving slowly or fidgety/restless 0 - - - -   Suicidal thoughts 0 - - - -   PHQ-9 Score 0 - - - -      Psychosocial Evaluation and Intervention:     Psychosocial Evaluation - 09/21/16 1634      Psychosocial Evaluation & Interventions   Interventions Encouraged to exercise with the program and follow exercise prescription   Continued Psychosocial Services Needed No      Psychosocial Re-Evaluation:     Psychosocial Re-Evaluation    Row Name 09/30/16 1203 11/01/16 1427 12/01/16 1255         Psychosocial Re-Evaluation   Interventions Encouraged to attend Cardiac Rehabilitation for the exercise Encouraged to attend Cardiac Rehabilitation for the exercise Encouraged to attend Cardiac Rehabilitation for the exercise     Comments Patient's QOL score was 26.14 and her PHQ-9 score was 0. She has no psychosocial issues.  Patient's QOL score was 26.14 and her PHQ-9 score was 0. She continues to have no psychosocial issues. Patient continues to have no psychosocial issues identified.      Continued Psychosocial Services Needed No No No        Vocational Rehabilitation: Provide vocational rehab assistance to qualifying candidates.   Vocational Rehab Evaluation & Intervention:     Vocational Rehab - 09/21/16 1626      Initial Vocational Rehab Evaluation & Intervention   Assessment shows need for Vocational Rehabilitation No      Education: Education Goals: Education classes will be provided on a weekly basis, covering required topics.  Participant will state understanding/return demonstration of topics presented.  Learning Barriers/Preferences:     Learning Barriers/Preferences - 09/21/16 1626      Learning Barriers/Preferences   Learning Barriers None   Learning Preferences Video;Pictoral      Education Topics: Hypertension, Hypertension Reduction -Define heart disease and high blood pressure. Discus how high blood pressure affects the body and ways to reduce high blood pressure. Flowsheet Row CARDIAC REHAB PHASE II EXERCISE from 11/17/2016 in [place]  Date  10/20/16  Educator  DC  Instruction Review Code  2- meets goals/outcomes      Exercise and Your Heart -Discuss why it is important to exercise, the FITT principles of exercise, normal and abnormal responses to exercise, and how to exercise safely.   Angina -Discuss definition of angina, causes of angina, treatment of angina, and how to decrease risk of having angina. Flowsheet Row CARDIAC REHAB PHASE II EXERCISE from 11/17/2016 in [place]  Date  11/03/16  Educator  Russella Dar  Instruction Review Code  2- meets goals/outcomes      Cardiac Medications -Review what the following cardiac medications are used for, how they affect the body, and side effects that may occur when taking the medications.  Medications include Aspirin, Beta blockers, calcium channel blockers, ACE Inhibitors, angiotensin receptor blockers, diuretics, digoxin, and antihyperlipidemics.   Congestive Heart Failure -Discuss the definition of CHF, how to live with CHF,  the signs and symptoms of CHF, and how keep track of weight and sodium intake. Flowsheet Row CARDIAC REHAB PHASE II EXERCISE from 11/17/2016 in [place]  Date  11/17/16  Educator  Suzanne Boron  Instruction Review Code  2- meets goals/outcomes      Heart Disease and Intimacy -Discus the effect sexual activity has on the heart, how changes  occur during intimacy as we age, and safety during sexual activity.   Smoking Cessation / COPD -Discuss different methods to quit smoking, the health benefits of quitting smoking, and the definition of COPD.   Nutrition I: Fats -Discuss the types of cholesterol, what cholesterol does to the heart, and how cholesterol levels can be controlled.   Nutrition II: Labels -Discuss the different components of food labels and how to read food label   Heart Parts and Heart Disease -Discuss the anatomy of the heart, the pathway of blood circulation through the heart, and these are affected by heart disease.   Stress I: Signs and Symptoms -Discuss the causes of stress, how stress may lead to anxiety and depression, and ways to limit stress. Flowsheet Row CARDIAC REHAB PHASE II EXERCISE from 11/17/2016 in [place]  Date  09/29/16  Educator  Russella Dar  Instruction Review Code  2- meets goals/outcomes      Stress II: Relaxation -Discuss different types of relaxation techniques to limit stress. Flowsheet Row CARDIAC REHAB PHASE II EXERCISE from 11/17/2016 in [place]  Date  10/06/16  Educator  Russella Dar  Instruction Review Code  2- meets goals/outcomes      Warning Signs of Stroke / TIA -Discuss definition of a stroke, what the signs and symptoms are of a stroke, and how to identify when someone is having stroke. Flowsheet Row CARDIAC REHAB PHASE II EXERCISE from 11/17/2016 in [place]  Date  10/13/16  Educator  Russella Dar  Instruction Review Code  2- meets goals/outcomes      Knowledge Questionnaire Score:     Knowledge Questionnaire Score - 09/21/16 1626      Knowledge Questionnaire Score   Pre Score --  Patient did not finish pre test.       Core Components/Risk Factors/Patient Goals at Admission:     Personal Goals and Risk Factors at Admission - 09/21/16 1630      Core Components/Risk  Factors/Patient Goals on Admission    Weight Management Weight Maintenance   Sedentary Yes   Intervention Provide advice, education, support and counseling about physical activity/exercise needs.;Develop an individualized exercise prescription for aerobic and resistive training based on initial evaluation findings, risk stratification, comorbidities and participant's personal goals.   Expected Outcomes Achievement of increased cardiorespiratory fitness and enhanced flexibility, muscular endurance and strength shown through measurements of functional capacity and personal statement of participant.   Increase Strength and Stamina Yes   Intervention Provide advice, education, support and counseling about physical activity/exercise needs.;Develop an individualized exercise prescription for aerobic and resistive training based on initial evaluation findings, risk stratification, comorbidities and participant's personal goals.   Expected Outcomes Achievement of increased cardiorespiratory fitness and enhanced flexibility, muscular endurance and strength shown through measurements of functional capacity and personal statement of participant.   Tobacco Cessation Yes   Intervention Advice worker, assist with locating and accessing local/national Quit Smoking programs, and support quit date choice.   Expected Outcomes Short Term: Will demonstrate readiness to quit, by selecting a quit date.   Diabetes Yes  Intervention Provide education about signs/symptoms and action to take for hypo/hyperglycemia.   Expected Outcomes Short Term: Participant verbalizes understanding of the signs/symptoms and immediate care of hyper/hypoglycemia, proper foot care and importance of medication, aerobic/resistive exercise and nutrition plan for blood glucose control.;Long Term: Attainment of HbA1C < 7%.   Personal Goal Breath better, walk more w/o SOB or chest discomfort.   Intervention attend CR 3x week and  supplement exercise 2 x week at home.   Expected Outcomes Reach personal goals.       Core Components/Risk Factors/Patient Goals Review:      Goals and Risk Factor Review    Row Name 09/21/16 1633 09/30/16 1200 11/01/16 1418 12/01/16 1253       Core Components/Risk Factors/Patient Goals Review   Personal Goals Review Increase Strength and Stamina;Sedentary;Tobacco Cessation;Diabetes Weight Management/Obesity;Increase Strength and Stamina;Sedentary;Tobacco Cessation;Other  Walk more w/o SOB.       Weight Management/Obesity;Increase Strength and Stamina;Tobacco Cessation;Sedentary;Other  Breathe better. Walk with decreased SOB. Weight Management/Obesity;Increase Strength and Stamina;Tobacco Cessation;Improve shortness of breath with ADL's  Walk more without SOB.    Review  - Patient has attended 3 sessions. She has lost 1.7 lbs. She is doing well. Will continue to monitor for progress. Patient has attended 12 sessions. She is maintaining her weight. She is getting stronger and her SOB is improving. She continues to smoke.  Patient has attended 18 sessions losing 0.9 lbs. She continue to smoke but is still working toward cessation. Her strength and stamina are increasing with improved SOB.    Expected Outcomes  - Patient will continue to attend sessions and meet her personal goals.  Patient will complete the program meeting her personal goals.  Patient will continue to attend sessions meeting her personal goals.        Core Components/Risk Factors/Patient Goals at Discharge (Final Review):      Goals and Risk Factor Review - 12/01/16 1253      Core Components/Risk Factors/Patient Goals Review   Personal Goals Review Weight Management/Obesity;Increase Strength and Stamina;Tobacco Cessation;Improve shortness of breath with ADL's  Walk more without SOB.   Review Patient has attended 18 sessions losing 0.9 lbs. She continue to smoke but is still working toward cessation. Her strength and  stamina are increasing with improved SOB.   Expected Outcomes Patient will continue to attend sessions meeting her personal goals.       ITP Comments:   Comments: ITP 30 Day REVIEW Patient is doing well with the program. Will continue to monitor for progress.

## 2016-12-01 NOTE — Progress Notes (Signed)
Daily Session Note  Patient Details  Name: Christine Harvey MRN: [id number] Date of Birth: May 18, 1956 Referring Provider:   Flowsheet Row CARDIAC REHAB PHASE II ORIENTATION from 09/21/2016 in [place]  Referring Provider  Dr. Gwenlyn Found      Encounter Date: 12/01/2016  Check In:     Session Check In - 12/01/16 1100      Check-In   Location [facility]   Staff Present Suzanne Boron, BS, EP, Exercise Physiologist;Debra Wynetta Emery, RN, BSN   Supervising physician immediately available to respond to emergencies See telemetry face sheet for immediately available MD   Medication changes reported     No   Fall or balance concerns reported    No   Warm-up and Cool-down Performed as group-led instruction   Resistance Training Performed Yes   VAD Patient? No     Pain Assessment   Currently in Pain? No/denies   Pain Score 0-No pain   Multiple Pain Sites No      Capillary Blood Glucose: No results found for this or any previous visit (from the past 24 hour(s)).   Goals Met:  Independence with exercise equipment Exercise tolerated well No report of cardiac concerns or symptoms Strength training completed today  Goals Unmet:  Not Applicable  Comments: Check out 1200   Dr. Kate Sable is Medical Director for [place] and Pulmonary Rehab.

## 2016-12-02 ENCOUNTER — Ambulatory Visit: Payer: Medicaid Other | Admitting: "Endocrinology

## 2016-12-03 ENCOUNTER — Encounter (HOSPITAL_COMMUNITY)
Admission: RE | Admit: 2016-12-03 | Discharge: 2016-12-03 | Disposition: A | Discharge status: Home / Self Care | Payer: Medicaid Other | Source: Ambulatory Visit | Attending: Cardiovascular Disease | Admitting: Cardiovascular Disease

## 2016-12-03 DIAGNOSIS — I213 ST elevation (STEMI) myocardial infarction of unspecified site: Secondary | ICD-10-CM | POA: Diagnosis not present

## 2016-12-03 NOTE — Progress Notes (Signed)
Daily Session Note  Patient Details  Name: LEIANNA BARGA MRN: [id number] Date of Birth: 1956/02/13 Referring Provider:   Flowsheet Row CARDIAC REHAB PHASE II ORIENTATION from 09/21/2016 in [place]  Referring Provider  Dr. Gwenlyn Found      Encounter Date: 12/03/2016  Check In:     Session Check In - 12/03/16 1054      Check-In   Location [facility]   Staff Present Suzanne Boron, BS, EP, Exercise Physiologist;Debra Wynetta Emery, RN, BSN   Supervising physician immediately available to respond to emergencies See telemetry face sheet for immediately available MD   Medication changes reported     No   Fall or balance concerns reported    No   Warm-up and Cool-down Performed as group-led instruction   Resistance Training Performed Yes   VAD Patient? No     Pain Assessment   Currently in Pain? No/denies   Pain Score 0-No pain   Multiple Pain Sites No      Capillary Blood Glucose: No results found for this or any previous visit (from the past 24 hour(s)).   Goals Met:  Independence with exercise equipment Exercise tolerated well No report of cardiac concerns or symptoms Strength training completed today  Goals Unmet:  Not Applicable  Comments: Check out 1200   Dr. Kate Sable is Medical Director for [place] and Pulmonary Rehab.

## 2016-12-06 ENCOUNTER — Encounter (HOSPITAL_COMMUNITY): Payer: Medicaid Other

## 2016-12-07 ENCOUNTER — Ambulatory Visit: Payer: Medicaid Other | Admitting: Cardiology

## 2016-12-08 ENCOUNTER — Telehealth: Payer: Self-pay | Admitting: Orthopaedic Surgery

## 2016-12-08 ENCOUNTER — Encounter (HOSPITAL_COMMUNITY)
Admission: RE | Admit: 2016-12-08 | Discharge: 2016-12-08 | Disposition: A | Discharge status: Home / Self Care | Payer: Medicaid Other | Source: Ambulatory Visit | Attending: Cardiovascular Disease | Admitting: Cardiovascular Disease

## 2016-12-08 DIAGNOSIS — I213 ST elevation (STEMI) myocardial infarction of unspecified site: Secondary | ICD-10-CM | POA: Diagnosis not present

## 2016-12-08 MED ORDER — HYDROCODONE-ACETAMINOPHEN 7.5-325 MG PO TABS: ORAL_TABLET | ORAL | 0 refills | Status: DC

## 2016-12-08 NOTE — Telephone Encounter (Signed)
Hydrocodone-Acetaminophen  7.5/325mg   Qty 25 Tablets

## 2016-12-08 NOTE — Progress Notes (Signed)
Daily Session Note  Patient Details  Name: Christine Harvey MRN: [id number] Date of Birth: 1956/10/19 Referring Provider:   Flowsheet Row CARDIAC REHAB PHASE II ORIENTATION from 09/21/2016 in [facility]  Referring Provider  Dr. Gwenlyn Found      Encounter Date: 12/08/2016  Check In:     Session Check In - 12/08/16 1100      Check-In   Location [facility]   Staff Present Aundra Dubin, RN, BSN;Shavonda Wiedman Luther Parody, BS, EP, Exercise Physiologist   Supervising physician immediately available to respond to emergencies See telemetry face sheet for immediately available MD   Medication changes reported     No   Fall or balance concerns reported    No   Warm-up and Cool-down Performed as group-led instruction   Resistance Training Performed Yes   VAD Patient? No     Pain Assessment   Currently in Pain? No/denies   Pain Score 0-No pain   Multiple Pain Sites No      Capillary Blood Glucose: No results found for this or any previous visit (from the past 24 hour(s)).   Goals Met:  Independence with exercise equipment Exercise tolerated well No report of cardiac concerns or symptoms Strength training completed today  Goals Unmet:  Not Applicable  Comments: Check out 1200   Dr. Kate Sable is Medical Director for [facility] and [facility].

## 2016-12-10 ENCOUNTER — Encounter (HOSPITAL_COMMUNITY): Payer: Medicaid Other

## 2016-12-13 ENCOUNTER — Encounter (HOSPITAL_COMMUNITY): Payer: Medicaid Other

## 2016-12-15 ENCOUNTER — Encounter (HOSPITAL_COMMUNITY): Payer: Medicaid Other

## 2016-12-17 ENCOUNTER — Encounter (HOSPITAL_COMMUNITY): Payer: Medicaid Other

## 2016-12-22 ENCOUNTER — Ambulatory Visit: Payer: Medicaid Other | Admitting: "Endocrinology

## 2016-12-27 ENCOUNTER — Telehealth: Payer: Self-pay | Admitting: Orthopaedic Surgery

## 2016-12-27 ENCOUNTER — Ambulatory Visit: Payer: Medicaid Other | Admitting: Cardiology

## 2016-12-27 NOTE — Telephone Encounter (Signed)
Hydrocodone-Acetaminophen  7.5/325mg   Qty  20 Tablets  Pt has Medicaid

## 2016-12-27 NOTE — Addendum Note (Signed)
Encounter addended by: Suzanne Boron on: 12/27/2016  8:33 AM<BR>    Actions taken: Flowsheet data copied forward, Flowsheet accepted, Visit Navigator Flowsheet section accepted

## 2016-12-27 NOTE — Telephone Encounter (Signed)
Denied.

## 2016-12-29 ENCOUNTER — Encounter (HOSPITAL_COMMUNITY)
Admission: RE | Admit: 2016-12-29 | Discharge: 2016-12-29 | Disposition: A | Discharge status: Home / Self Care | Payer: Medicaid Other | Source: Ambulatory Visit | Attending: Cardiovascular Disease | Admitting: Cardiovascular Disease

## 2016-12-29 DIAGNOSIS — I213 ST elevation (STEMI) myocardial infarction of unspecified site: Secondary | ICD-10-CM | POA: Diagnosis not present

## 2016-12-29 NOTE — Progress Notes (Deleted)
Cardiology Office Note  Date: 12/29/2016   ID: Christine Harvey, DOB 17-Nov-1956, MRN [id number]  PCP: Celedonio Savage, MD  Primary Cardiologist: Rozann Lesches, MD   No chief complaint on file.   History of Present Illness: Christine Harvey is a 61 y.o. female last seen in August 2017 by Ms. Lawrence NP. This is our first meeting today. I reviewed her records and updated her chart.  Past Medical History:  Diagnosis Date  . Arthritis    osteoarthritis  . 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  some iInvolvement long the Clival bone and Effect upon the Pons - s/p sterotactic radiosurgery in 2014.  Marland Kitchen CAD (coronary artery disease)    80-99% first diagonal (unable to be intervened upon), 60% circumflex, 75% RCA August 2017  . Depression   . Essential hypertension   . Frequent headaches   . Hyperlipidemia   . Morbid obesity ([place])   . Photophobia    With Headaches  . S/P radiation therapy 05/02/13   25 Gy at 5 Gy per fraction, last treatment on 04/30/13  . ST elevation myocardial infarction (STEMI) of inferolateral wall [facility])    August 2017  . Type 2 diabetes mellitus ([place])     Past Surgical History:  Procedure Laterality Date  . CARDIAC CATHETERIZATION N/A 07/31/2016   Procedure: Left Heart Cath and Coronary Angiography;  Surgeon: Lorretta Harp, MD;  Location: [facility];  Service: Cardiovascular;  Laterality: N/A;  . TONSILLECTOMY     As a child    Current Outpatient Prescriptions  Medication Sig Dispense Refill  . acyclovir (ZOVIRAX) 200 MG capsule Take 200 mg by mouth as needed (for cold sores).     Marland Kitchen aspirin EC 81 MG EC tablet Take 1 tablet (81 mg total) by mouth daily.    Marland Kitchen atorvastatin (LIPITOR) 80 MG tablet Take 1 tablet (80 mg total) by mouth daily at 6 PM. 30 tablet 12  . Blood Glucose Monitoring Suppl (ACCU-CHEK AVIVA) device Use as instructed 1 each 0  . calcipotriene-betamethasone (TACLONEX SCALP)  external suspension Apply topically.    . carvedilol (COREG) 25 MG tablet TAKE 1 TABLET BY MOUTH TWICE DAILY WITH A MEAL.  12  . clobetasol (TEMOVATE) 0.05 % external solution Apply to affected areas on scalp once daily. Never to face.    . Clobetasol Propionate (CLOBEX) 0.05 % shampoo APPLY TO SCALP ONCE DAILY IN SHOWER, THEN RINSE.    Marland Kitchen clopidogrel (PLAVIX) 75 MG tablet Take 1 tablet (75 mg total) by mouth daily with breakfast. 30 tablet 12  . FLUOCINOLONE ACETONIDE SCALP 0.01 % OIL APPLY TO SCALP EVERY DAY AS DIRECTED    . glucose blood (ACCU-CHEK AVIVA) test strip Test glucose 4 times a day E11.65 150 each 3  . hydrochlorothiazide (HYDRODIURIL) 25 MG tablet Take 25 mg by mouth.    Marland Kitchen HYDROcodone-acetaminophen (NORCO) 7.5-325 MG tablet One every six hours for pain as needed.  Do not drive car or operate machinery while taking this medicine.  Must last 14 days. 20 tablet 0  . Insulin Glargine (LANTUS SOLOSTAR) 100 UNIT/ML Solostar Pen Inject 50 Units into the skin at bedtime.    . INVOKANA 100 MG TABS tablet TAKE 1 TABLET BY MOUTH DAILY BEFORE BREAKFAST. 30 tablet 2  . Lancet Devices (ACCU-CHEK SOFTCLIX) lancets Use as instructed 1 each 0  . Lancets MISC Test 4 x  daily. E11.65. Accu-Chek Aviva 200 each 5  . losartan (COZAAR) 100 MG tablet Take 1 tablet (100 mg total) by mouth daily. 30 tablet 12  . metFORMIN (GLUCOPHAGE) 500 MG tablet TAKE 1 TABLET BY MOUTH TWICE DAILY WITH A MEAL 60 tablet 2  . nitroGLYCERIN (NITROSTAT) 0.4 MG SL tablet Place 1 tablet (0.4 mg total) under the tongue every 5 (five) minutes x 3 doses as needed for chest pain. 25 tablet 2  . potassium chloride SA (KLOR-CON M20) 20 MEQ tablet Take 1 tablet (20 mEq total) by mouth daily. 90 tablet 3   No current facility-administered medications for this visit.    Allergies:  Sulfa antibiotics   Social History: The patient  reports that she has been smoking Cigarettes.  She has never used smokeless tobacco. She reports that she  drinks alcohol. She reports that she does not use drugs.   Family History: The patient's family history includes CAD in her mother; Cancer in her sister; Hypertension in her father and mother; Stroke in her father.   ROS:  Please see the history of present illness. Otherwise, complete review of systems is positive for {NONE DEFAULTED:18576::"none"}.  All other systems are reviewed and negative.   Physical Exam: VS:  There were no vitals taken for this visit., BMI There is no height or weight on file to calculate BMI.  Wt Readings from Last 3 Encounters:  10/26/16 180 lb (81.6 kg)  09/21/16 183 lb 3.2 oz (83.1 kg)  08/31/16 188 lb (85.3 kg)    General: Patient appears comfortable at rest. HEENT: Conjunctiva and lids normal, oropharynx clear with moist mucosa. Neck: Supple, no elevated JVP or carotid bruits, no thyromegaly. Lungs: Clear to auscultation, nonlabored breathing at rest. Cardiac: Regular rate and rhythm, no S3 or significant systolic murmur, no pericardial rub. Abdomen: Soft, nontender, no hepatomegaly, bowel sounds present, no guarding or rebound. Extremities: No pitting edema, distal pulses 2+. Skin: Warm and dry. Musculoskeletal: No kyphosis. Neuropsychiatric: Alert and oriented x3, affect grossly appropriate.  ECG: I personally reviewed the tracing from 08/02/2016 which showed sinus rhythm with nonspecific ST-T changes.  Recent Labwork: 08/02/2016: Hemoglobin 11.3; Platelets 302 11/24/2016: ALT 14; AST 19; BUN 21; Creatinine, Ser 1.43; Potassium 4.0; Sodium 134     Component Value Date/Time   CHOL 90 11/24/2016 1040   TRIG 135 11/24/2016 1040   HDL 33 (L) 11/24/2016 1040   CHOLHDL 2.7 11/24/2016 1040   VLDL 27 11/24/2016 1040   LDLCALC 30 11/24/2016 1040    Other Studies Reviewed Today:  Echocardiogram 08/01/2016: Study Conclusions  - Left ventricle: The cavity size was normal. Systolic function was   normal. Hypokinesis of the anterolateral myocardium.  Doppler   parameters are consistent with abnormal left ventricular   relaxation (grade 1 diastolic dysfunction). Doppler parameters   are consistent with high ventricular filling pressure. - Aortic valve: Functionally bicuspid; mildly thickened, mildly   calcified leaflets. Valve mobility was restricted. There was mild   stenosis. There was no regurgitation. Valve area (VTI): 1.69   cm^2. Valve area (Vmax): 1.61 cm^2. Valve area (Vmean): 1.38   cm^2. - Mitral valve: Transvalvular velocity was within the normal range.   There was no evidence for stenosis. There was trivial   regurgitation. - Left atrium: The atrium was moderately dilated. - Right ventricle: The cavity size was normal. Wall thickness was   normal. Systolic function was normal. - Tricuspid valve: There was trivial regurgitation. - Pulmonary arteries: Systolic pressure was within the  normal   range. PA peak pressure: 20 mm Hg (S).  Cardiac catheterization 07/31/2016:  Mid RCA lesion, 75 %stenosed.  Prox Cx to Mid Cx lesion, 60 %stenosed.  Ost 1st Diag to 1st Diag lesion, 99 %stenosed.  1st Diag lesion, 80 %stenosed.  There is mild left ventricular systolic dysfunction.  The left ventricular ejection fraction is 50-55% by visual estimate.  Unsuccessful attempt at PCI of a high-grade high first diagonal branch in the setting of a high lateral STEMI. Because of the angulation takeoff, tortuosity of the vessel and calcification of the lesion I was unable to cross the lesion successfully and intervene. The patient was pain-free at the end of the procedure. I recommend continued medical therapy.  Assessment and Plan:    Current medicines were reviewed with the patient today.  No orders of the defined types were placed in this encounter.   Disposition:  Signed, Satira Sark, MD, [facility] 12/29/2016 8:22 AM    [facility] [place], [place], [place] [address] Phone: [phone]; Fax: [phone]

## 2016-12-29 NOTE — Progress Notes (Signed)
Daily Session Note  Patient Details  Name: Christine Harvey MRN: [id number] Date of Birth: 12-Jul-1956 Referring Provider:   Flowsheet Row CARDIAC REHAB PHASE II ORIENTATION from 09/21/2016 in [facility]  Referring Provider  Dr. Gwenlyn Found      Encounter Date: 12/29/2016  Check In:     Session Check In - 12/29/16 1100      Check-In   Location [facility]   Staff Present Aundra Dubin, RN, BSN;Elwyn Lowden Luther Parody, BS, EP, Exercise Physiologist   Supervising physician immediately available to respond to emergencies See telemetry face sheet for immediately available MD   Medication changes reported     No   Fall or balance concerns reported    No   Warm-up and Cool-down Performed as group-led instruction   Resistance Training Performed Yes   VAD Patient? No     Pain Assessment   Currently in Pain? No/denies   Pain Score 0-No pain   Multiple Pain Sites No      Capillary Blood Glucose: No results found for this or any previous visit (from the past 24 hour(s)).   Goals Met:  Independence with exercise equipment Exercise tolerated well No report of cardiac concerns or symptoms Strength training completed today  Goals Unmet:  Not Applicable  Comments: Check out 1200   Dr. Kate Sable is Medical Director for [facility] and [facility].

## 2016-12-30 ENCOUNTER — Encounter: Payer: Self-pay | Admitting: Cardiology

## 2016-12-30 ENCOUNTER — Ambulatory Visit: Payer: Medicaid Other | Admitting: Cardiology

## 2016-12-30 NOTE — Progress Notes (Signed)
Cardiac Individual Treatment Plan  Patient Details  Name: Christine Harvey MRN: [id number] Date of Birth: [birth year] Referring Provider:   Flowsheet Row CARDIAC REHAB PHASE II ORIENTATION from 09/21/2016 in [place]  Referring Provider  Dr. Gwenlyn Found      Initial Encounter Date:  Flowsheet Row CARDIAC REHAB PHASE II ORIENTATION from 09/21/2016 in [place]  Date  09/21/16  Referring Provider  Dr. Gwenlyn Found      Visit Diagnosis: ST elevation myocardial infarction (STEMI), unspecified artery ([place])  Patient's Home Medications on Admission:  Current Outpatient Prescriptions:  .  acyclovir (ZOVIRAX) 200 MG capsule, Take 200 mg by mouth as needed (for cold sores). , Disp: , Rfl:  .  aspirin EC 81 MG EC tablet, Take 1 tablet (81 mg total) by mouth daily., Disp: , Rfl:  .  atorvastatin (LIPITOR) 80 MG tablet, Take 1 tablet (80 mg total) by mouth daily at 6 PM., Disp: 30 tablet, Rfl: 12 .  Blood Glucose Monitoring Suppl (ACCU-CHEK AVIVA) device, Use as instructed, Disp: 1 each, Rfl: 0 .  calcipotriene-betamethasone (TACLONEX SCALP) external suspension, Apply topically., Disp: , Rfl:  .  carvedilol (COREG) 25 MG tablet, TAKE 1 TABLET BY MOUTH TWICE DAILY WITH A MEAL., Disp: , Rfl: 12 .  clobetasol (TEMOVATE) 0.05 % external solution, Apply to affected areas on scalp once daily. Never to face., Disp: , Rfl:  .  Clobetasol Propionate (CLOBEX) 0.05 % shampoo, APPLY TO SCALP ONCE DAILY IN SHOWER, THEN RINSE., Disp: , Rfl:  .  clopidogrel (PLAVIX) 75 MG tablet, Take 1 tablet (75 mg total) by mouth daily with breakfast., Disp: 30 tablet, Rfl: 12 .  FLUOCINOLONE ACETONIDE SCALP 0.01 % OIL, APPLY TO SCALP EVERY DAY AS DIRECTED, Disp: , Rfl:  .  glucose blood (ACCU-CHEK AVIVA) test strip, Test glucose 4 times a day E11.65, Disp: 150 each, Rfl: 3 .  hydrochlorothiazide (HYDRODIURIL) 25 MG tablet, Take 25 mg by mouth., Disp: , Rfl:  .  HYDROcodone-acetaminophen  (NORCO) 7.5-325 MG tablet, One every six hours for pain as needed.  Do not drive car or operate machinery while taking this medicine.  Must last 14 days., Disp: 20 tablet, Rfl: 0 .  Insulin Glargine (LANTUS SOLOSTAR) 100 UNIT/ML Solostar Pen, Inject 50 Units into the skin at bedtime., Disp: , Rfl:  .  INVOKANA 100 MG TABS tablet, TAKE 1 TABLET BY MOUTH DAILY BEFORE BREAKFAST., Disp: 30 tablet, Rfl: 2 .  Lancet Devices (ACCU-CHEK SOFTCLIX) lancets, Use as instructed, Disp: 1 each, Rfl: 0 .  Lancets MISC, Test 4 x daily. E11.65. Accu-Chek Aviva, Disp: 200 each, Rfl: 5 .  losartan (COZAAR) 100 MG tablet, Take 1 tablet (100 mg total) by mouth daily., Disp: 30 tablet, Rfl: 12 .  metFORMIN (GLUCOPHAGE) 500 MG tablet, TAKE 1 TABLET BY MOUTH TWICE DAILY WITH A MEAL, Disp: 60 tablet, Rfl: 2 .  nitroGLYCERIN (NITROSTAT) 0.4 MG SL tablet, Place 1 tablet (0.4 mg total) under the tongue every 5 (five) minutes x 3 doses as needed for chest pain., Disp: 25 tablet, Rfl: 2 .  potassium chloride SA (KLOR-CON M20) 20 MEQ tablet, Take 1 tablet (20 mEq total) by mouth daily., Disp: 90 tablet, Rfl: 3  Past Medical History: Past Medical History:  Diagnosis Date  . Arthritis    osteoarthritis  . 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  some iInvolvement long the Clival bone  and Effect upon the Pons - s/p sterotactic radiosurgery in 2014.  Marland Kitchen CAD (coronary artery disease)    80-99% first diagonal (unable to be intervened upon), 60% circumflex, 75% RCA August 2017  . Depression   . Essential hypertension   . Frequent headaches   . Hyperlipidemia   . Morbid obesity ([place])   . Photophobia    With Headaches  . S/P radiation therapy 05/02/13   25 Gy at 5 Gy per fraction, last treatment on 04/30/13  . ST elevation myocardial infarction (STEMI) of inferolateral wall [facility])    August 2017  . Type 2 diabetes mellitus (HCC)     Tobacco Use: History  Smoking Status  .  Light Tobacco Smoker  . Types: Cigarettes  Smokeless Tobacco  . Never Used    Labs: Recent Review Flowsheet Data    Labs for ITP Cardiac and Pulmonary Rehab Latest Ref Rng & Units 01/27/2016 05/14/2016 07/31/2016 08/01/2016 11/24/2016   Cholestrol 0 - 200 mg/dL - - 116 117 90   LDLCALC 0 - 99 mg/dL - - 60 64 30   HDL >40 mg/dL - - 28(L) 33(L) 33(L)   Trlycerides <150 mg/dL - - 141 101 135   Hemoglobin A1c 4.8 - 5.6 % 10.4 11.4 9.0(H) - 9.4(H)   TCO2 0 - 100 mmol/L - - 25 - -      Capillary Blood Glucose: Lab Results  Component Value Date   GLUCAP 154 (H) 08/03/2016   GLUCAP 145 (H) 08/03/2016   GLUCAP 176 (H) 08/02/2016   GLUCAP 206 (H) 08/02/2016   GLUCAP 148 (H) 08/02/2016     Exercise Target Goals:    Exercise Program Goal: Individual exercise prescription set with THRR, safety & activity barriers. Participant demonstrates ability to understand and report RPE using BORG scale, to self-measure pulse accurately, and to acknowledge the importance of the exercise prescription.  Exercise Prescription Goal: Starting with aerobic activity 30 plus minutes a day, 3 days per week for initial exercise prescription. Provide home exercise prescription and guidelines that participant acknowledges understanding prior to discharge.  Activity Barriers & Risk Stratification:     Activity Barriers & Cardiac Risk Stratification - 09/21/16 1622      Activity Barriers & Cardiac Risk Stratification   Activity Barriers None   Cardiac Risk Stratification High      6 Minute Walk:     6 Minute Walk    Row Name 09/21/16 1508         6 Minute Walk   Phase Initial     Distance 700 feet     Walk Time 4.5 minutes     # of Rest Breaks 0     MPH 1.32     METS 2.01     RPE 11     Perceived Dyspnea  13     VO2 Peak 6.55     Symptoms Yes (comment)     Comments Patient had to stop at four miniutes and thirty seconds due to chest pressure 2/10. This pressure subsided after a two minute rest  break      Resting HR 62 bpm     Resting BP 142/62     Max Ex. HR 80 bpm     Max Ex. BP 154/64     2 Minute Post BP 138/66        Initial Exercise Prescription:     Initial Exercise Prescription - 09/21/16 1500      Date of Initial Exercise  RX and Referring Provider   Date 09/21/16   Referring Provider Dr. Julius Bowels   Level 2   Watts 15   Minutes 20   METs 1.9     Arm Ergometer   Level 2   Watts 15   Minutes 15   METs 1.9     Prescription Details   Frequency (times per week) 3   Duration Progress to 30 minutes of continuous aerobic without signs/symptoms of physical distress     Intensity   THRR REST +  30   THRR 40-80% of Max Heartrate 101-121-140   Ratings of Perceived Exertion 11-13   Perceived Dyspnea 0-4     Progression   Progression Continue progressive overload as per policy without signs/symptoms or physical distress.     Resistance Training   Training Prescription Yes   Weight 1   Reps 10-12      Perform Capillary Blood Glucose checks as needed.  Exercise Prescription Changes:      Exercise Prescription Changes    Row Name 09/29/16 1400 10/29/16 1200 11/25/16 1400 12/27/16 0800       Exercise Review   Progression Yes Yes Yes Yes      Response to Exercise   Blood Pressure (Admit) 110/64 136/70 110/70 132/72    Blood Pressure (Exercise) 138/74 118/52 110/72 140/72    Blood Pressure (Exit) 106/60 152/88 114/68 130/80    Heart Rate (Admit) 66 bpm 77 bpm 73 bpm 65 bpm    Heart Rate (Exercise) 74 bpm 64 bpm 73 bpm 75 bpm    Heart Rate (Exit) 72 bpm 47 bpm 80 bpm 74 bpm    Rating of Perceived Exertion (Exercise) 11 9 9 10     Duration Progress to 30 minutes of continuous aerobic without signs/symptoms of physical distress Progress to 30 minutes of continuous aerobic without signs/symptoms of physical distress Progress to 30 minutes of continuous aerobic without signs/symptoms of physical distress Progress to 30 minutes of continuous  aerobic without signs/symptoms of physical distress    Intensity Rest + 30 Rest + 30 Rest + 30 Rest + 30      Progression   Progression Continue progressive overload as per policy without signs/symptoms or physical distress. Continue progressive overload as per policy without signs/symptoms or physical distress. Continue progressive overload as per policy without signs/symptoms or physical distress. Continue progressive overload as per policy without signs/symptoms or physical distress.      Resistance Training   Training Prescription Yes Yes Yes Yes    Weight 2 2 2 2     Reps 10-12 10-12 10-12 10-12      NuStep   Level 2 2 3 3     Watts 12 16 13 20     Minutes 20 20 20 20     METs 3.56 2.2 3.56 3.56      Arm Ergometer   Level 2.3 2.2 2.3 2.6    Watts 3 3 2 4     Minutes 15 15 15 15     METs 2.3 2 2.3 2.6      Home Exercise Plan   Plans to [facility] exercise at [facility]    Frequency Add 2 additional days to program exercise sessions. Add 2 additional days to program exercise sessions. Add 2 additional days to program exercise sessions. Add 2 additional days to program exercise sessions.       Exercise Comments:      Exercise Comments    Row  Name 09/29/16 1450 10/29/16 1245 11/25/16 1440 12/27/16 0832     Exercise Comments Patient is proggressing well Patient is progressing but has not been attending regularly  Patient is progressing appropriately  Patient is proggressing appropriately although her attendance is lacking        Discharge Exercise Prescription (Final Exercise Prescription Changes):     Exercise Prescription Changes - 12/27/16 0800      Exercise Review   Progression Yes     Response to Exercise   Blood Pressure (Admit) 132/72   Blood Pressure (Exercise) 140/72   Blood Pressure (Exit) 130/80   Heart Rate (Admit) 65 bpm   Heart Rate (Exercise) 75 bpm   Heart Rate (Exit) 74 bpm   Rating of Perceived Exertion (Exercise) 10   Duration Progress to  30 minutes of continuous aerobic without signs/symptoms of physical distress   Intensity Rest + 30     Progression   Progression Continue progressive overload as per policy without signs/symptoms or physical distress.     Resistance Training   Training Prescription Yes   Weight 2   Reps 10-12     NuStep   Level 3   Watts 20   Minutes 20   METs 3.56     Arm Ergometer   Level 2.6   Watts 4   Minutes 15   METs 2.6     Home Exercise Plan   Plans to continue exercise at Home   Frequency Add 2 additional days to program exercise sessions.      Nutrition:  Target Goals: Understanding of nutrition guidelines, daily intake of sodium 1500mg , cholesterol 200mg , calories 30% from fat and 7% or less from saturated fats, daily to have 5 or more servings of fruits and vegetables.  Biometrics:     Pre Biometrics - 09/21/16 1511      Pre Biometrics   Height 5\' 4"  (1.626 m)   Weight 183 lb 3.2 oz (83.1 kg)   Waist Circumference 38 inches   Hip Circumference 41.5 inches   Waist to Hip Ratio 0.92 %   BMI (Calculated) 31.5   Triceps Skinfold 15 mm   % Body Fat 38.1 %   Grip Strength 55.7 kg   Flexibility 0 in   Single Leg Stand 10 seconds       Nutrition Therapy Plan and Nutrition Goals:   Nutrition Discharge: Rate Your Plate Scores:     Nutrition Assessments - 09/21/16 1630      MEDFICTS Scores   Pre Score 21      Nutrition Goals Re-Evaluation:   Psychosocial: Target Goals: Acknowledge presence or absence of depression, maximize coping skills, provide positive support system. Participant is able to verbalize types and ability to use techniques and skills needed for reducing stress and depression.  Initial Review & Psychosocial Screening:     Initial Psych Review & Screening - 09/21/16 1633      Initial Review   Current issues with --  Main concern is transportation. she can ride [employer], they are sometimes not reliable.      Family Dynamics   Good Support  System? Yes     Barriers   Psychosocial barriers to participate in program There are no identifiable barriers or psychosocial needs.     Screening Interventions   Interventions Encouraged to exercise      Quality of Life Scores:     Quality of Life - 09/21/16 1512      Quality of Life Scores  Health/Function Pre 22.15 %   Socioeconomic Pre 29 %   Psych/Spiritual Pre 30 %   Family Pre 30 %   GLOBAL Pre 26.14 %      PHQ-9: Recent Review Flowsheet Data    Depression screen [facility] 2/9 09/21/2016 05/20/2016 05/12/2016 03/09/2016 02/26/2016   Decreased Interest 0 0 0 0 0   Down, Depressed, Hopeless 0 0 0 0 0   PHQ - 2 Score 0 0 0 0 0   Altered sleeping 0 - - - -   Tired, decreased energy 0 - - - -   Change in appetite 0 - - - -   Feeling bad or failure about yourself  0 - - - -   Trouble concentrating 0 - - - -   Moving slowly or fidgety/restless 0 - - - -   Suicidal thoughts 0 - - - -   PHQ-9 Score 0 - - - -      Psychosocial Evaluation and Intervention:     Psychosocial Evaluation - 09/21/16 1634      Psychosocial Evaluation & Interventions   Interventions Encouraged to exercise with the program and follow exercise prescription   Continued Psychosocial Services Needed No      Psychosocial Re-Evaluation:     Psychosocial Re-Evaluation    Row Name 09/30/16 1203 11/01/16 1427 12/01/16 1255 12/30/16 1459       Psychosocial Re-Evaluation   Interventions Encouraged to attend Cardiac Rehabilitation for the exercise Encouraged to attend Cardiac Rehabilitation for the exercise Encouraged to attend Cardiac Rehabilitation for the exercise Encouraged to attend Cardiac Rehabilitation for the exercise    Comments Patient's QOL score was 26.14 and her PHQ-9 score was 0. She has no psychosocial issues.  Patient's QOL score was 26.14 and her PHQ-9 score was 0. She continues to have no psychosocial issues. Patient continues to have no psychosocial issues identified.  Patient continues  to have no psychosocial issues identified.     Continued Psychosocial Services Needed No No No No       Vocational Rehabilitation: Provide vocational rehab assistance to qualifying candidates.   Vocational Rehab Evaluation & Intervention:     Vocational Rehab - 09/21/16 1626      Initial Vocational Rehab Evaluation & Intervention   Assessment shows need for Vocational Rehabilitation No      Education: Education Goals: Education classes will be provided on a weekly basis, covering required topics. Participant will state understanding/return demonstration of topics presented.  Learning Barriers/Preferences:     Learning Barriers/Preferences - 09/21/16 1626      Learning Barriers/Preferences   Learning Barriers None   Learning Preferences Video;Pictoral      Education Topics: Hypertension, Hypertension Reduction -Define heart disease and high blood pressure. Discus how high blood pressure affects the body and ways to reduce high blood pressure. Flowsheet Row CARDIAC REHAB PHASE II EXERCISE from 12/29/2016 in [place]  Date  (P) 10/20/16  Educator  (P) DC  Instruction Review Code  (P) 2- meets goals/outcomes      Exercise and Your Heart -Discuss why it is important to exercise, the FITT principles of exercise, normal and abnormal responses to exercise, and how to exercise safely.   Angina -Discuss definition of angina, causes of angina, treatment of angina, and how to decrease risk of having angina. Flowsheet Row CARDIAC REHAB PHASE II EXERCISE from 12/29/2016 in [place]  Date  (P) 11/03/16  Educator  (P) Russella Dar  Instruction Review  Code  (P) 2- meets goals/outcomes      Cardiac Medications -Review what the following cardiac medications are used for, how they affect the body, and side effects that may occur when taking the medications.  Medications include Aspirin, Beta blockers, calcium channel blockers, ACE  Inhibitors, angiotensin receptor blockers, diuretics, digoxin, and antihyperlipidemics.   Congestive Heart Failure -Discuss the definition of CHF, how to live with CHF, the signs and symptoms of CHF, and how keep track of weight and sodium intake. Flowsheet Row CARDIAC REHAB PHASE II EXERCISE from 12/29/2016 in [place]  Date  (P) 11/17/16  Educator  (P) Suzanne Boron  Instruction Review Code  (P) 2- meets goals/outcomes      Heart Disease and Intimacy -Discus the effect sexual activity has on the heart, how changes occur during intimacy as we age, and safety during sexual activity.   Smoking Cessation / COPD -Discuss different methods to quit smoking, the health benefits of quitting smoking, and the definition of COPD. Flowsheet Row CARDIAC REHAB PHASE II EXERCISE from 12/29/2016 in [place]  Date  (P) 12/01/16  Educator  (P) Russella Dar  Instruction Review Code  (P) 2- meets goals/outcomes      Nutrition I: Fats -Discuss the types of cholesterol, what cholesterol does to the heart, and how cholesterol levels can be controlled. Flowsheet Row CARDIAC REHAB PHASE II EXERCISE from 12/29/2016 in [place]  Date  (P) 12/08/16  Educator  (P) Russella Dar  Instruction Review Code  (P) 2- meets goals/outcomes      Nutrition II: Labels -Discuss the different components of food labels and how to read food label   Heart Parts and Heart Disease -Discuss the anatomy of the heart, the pathway of blood circulation through the heart, and these are affected by heart disease.   Stress I: Signs and Symptoms -Discuss the causes of stress, how stress may lead to anxiety and depression, and ways to limit stress. Flowsheet Row CARDIAC REHAB PHASE II EXERCISE from 12/29/2016 in [place]  Date  (P) 09/29/16  Educator  (P) Russella Dar  Instruction Review Code  (P) 2- meets goals/outcomes      Stress  II: Relaxation -Discuss different types of relaxation techniques to limit stress. Flowsheet Row CARDIAC REHAB PHASE II EXERCISE from 12/29/2016 in [place]  Date  (P) 10/06/16  Educator  (P) Russella Dar  Instruction Review Code  (P) 2- meets goals/outcomes      Warning Signs of Stroke / TIA -Discuss definition of a stroke, what the signs and symptoms are of a stroke, and how to identify when someone is having stroke. Flowsheet Row CARDIAC REHAB PHASE II EXERCISE from 12/29/2016 in [place]  Date  (P) 10/13/16  Educator  (P) Russella Dar  Instruction Review Code  (P) 2- meets goals/outcomes      Knowledge Questionnaire Score:     Knowledge Questionnaire Score - 09/21/16 1626      Knowledge Questionnaire Score   Pre Score --  Patient did not finish pre test.       Core Components/Risk Factors/Patient Goals at Admission:     Personal Goals and Risk Factors at Admission - 09/21/16 1630      Core Components/Risk Factors/Patient Goals on Admission    Weight Management Weight Maintenance   Sedentary Yes   Intervention Provide advice, education, support and counseling about physical activity/exercise needs.;Develop an individualized exercise prescription for  aerobic and resistive training based on initial evaluation findings, risk stratification, comorbidities and participant's personal goals.   Expected Outcomes Achievement of increased cardiorespiratory fitness and enhanced flexibility, muscular endurance and strength shown through measurements of functional capacity and personal statement of participant.   Increase Strength and Stamina Yes   Intervention Provide advice, education, support and counseling about physical activity/exercise needs.;Develop an individualized exercise prescription for aerobic and resistive training based on initial evaluation findings, risk stratification, comorbidities and participant's personal goals.    Expected Outcomes Achievement of increased cardiorespiratory fitness and enhanced flexibility, muscular endurance and strength shown through measurements of functional capacity and personal statement of participant.   Tobacco Cessation Yes   Intervention Advice worker, assist with locating and accessing local/national Quit Smoking programs, and support quit date choice.   Expected Outcomes Short Term: Will demonstrate readiness to quit, by selecting a quit date.   Diabetes Yes   Intervention Provide education about signs/symptoms and action to take for hypo/hyperglycemia.   Expected Outcomes Short Term: Participant verbalizes understanding of the signs/symptoms and immediate care of hyper/hypoglycemia, proper foot care and importance of medication, aerobic/resistive exercise and nutrition plan for blood glucose control.;Long Term: Attainment of HbA1C < 7%.   Personal Goal Breath better, walk more w/o SOB or chest discomfort.   Intervention attend CR 3x week and supplement exercise 2 x week at home.   Expected Outcomes Reach personal goals.       Core Components/Risk Factors/Patient Goals Review:      Goals and Risk Factor Review    Row Name 09/21/16 1633 09/30/16 1200 11/01/16 1418 12/01/16 1253 12/30/16 1457     Core Components/Risk Factors/Patient Goals Review   Personal Goals Review Increase Strength and Stamina;Sedentary;Tobacco Cessation;Diabetes Weight Management/Obesity;Increase Strength and Stamina;Sedentary;Tobacco Cessation;Other  Walk more w/o SOB.       Weight Management/Obesity;Increase Strength and Stamina;Tobacco Cessation;Sedentary;Other  Breathe better. Walk with decreased SOB. Weight Management/Obesity;Increase Strength and Stamina;Tobacco Cessation;Improve shortness of breath with ADL's  Walk more without SOB. Weight Management/Obesity;Increase Strength and Stamina;Diabetes;Improve shortness of breath with ADL's  Breath better, walk more w/o SOB or chest  discomfort.   Review  - Patient has attended 3 sessions. She has lost 1.7 lbs. She is doing well. Will continue to monitor for progress. Patient has attended 12 sessions. She is maintaining her weight. She is getting stronger and her SOB is improving. She continues to smoke.  Patient has attended 18 sessions losing 0.9 lbs. She continue to smoke but is still working toward cessation. Her strength and stamina are increasing with improved SOB. Patient has attended 21 sessions. Her attendance has been inconsistent. She has maintained her weight. She has progressed some with some improvement in her strength and stamina and SOB.    Expected Outcomes  - Patient will continue to attend sessions and meet her personal goals.  Patient will complete the program meeting her personal goals.  Patient will continue to attend sessions meeting her personal goals.  Patient will attend sessions consistently and complete the program meeting her personal goals.       Core Components/Risk Factors/Patient Goals at Discharge (Final Review):      Goals and Risk Factor Review - 12/30/16 1457      Core Components/Risk Factors/Patient Goals Review   Personal Goals Review Weight Management/Obesity;Increase Strength and Stamina;Diabetes;Improve shortness of breath with ADL's  Breath better, walk more w/o SOB or chest discomfort.   Review Patient has attended 21 sessions. Her attendance has been inconsistent. She  has maintained her weight. She has progressed some with some improvement in her strength and stamina and SOB.    Expected Outcomes Patient will attend sessions consistently and complete the program meeting her personal goals.       ITP Comments:   Comments: ITP 30 Day REVIEW Patient doing well with program. Will continue to monitor for progress.

## 2016-12-31 ENCOUNTER — Encounter (HOSPITAL_COMMUNITY)
Admission: RE | Admit: 2016-12-31 | Discharge: 2016-12-31 | Disposition: A | Discharge status: Home / Self Care | Payer: Medicaid Other | Source: Ambulatory Visit | Attending: Cardiovascular Disease | Admitting: Cardiovascular Disease

## 2016-12-31 DIAGNOSIS — I213 ST elevation (STEMI) myocardial infarction of unspecified site: Secondary | ICD-10-CM | POA: Diagnosis not present

## 2016-12-31 LAB — GLUCOSE, CAPILLARY: Glucose-Capillary: 509 mg/dL (ref 65–99)

## 2016-12-31 NOTE — Progress Notes (Signed)
Incomplete Session Note  Patient Details  Name: Christine Harvey MRN: [id number] Date of Birth: 1956/08/30 Referring Provider:   Flowsheet Row CARDIAC REHAB PHASE II ORIENTATION from 09/21/2016 in [place]  Referring Provider  Dr. Tish Men did not complete her rehab session.  Patient was not allowed to exercise. She reported her glucose was 322 this am at home. We checked her glucose: was 509. She was asymptomatic. She reports taking her Metformin 500 mg this am at 8:30. She also takes insulin at hs. Patient rides [employer]. She remained in the gym during the session. She was asymptomatic when she left.

## 2017-01-03 ENCOUNTER — Encounter (HOSPITAL_COMMUNITY): Payer: Medicaid Other

## 2017-01-05 ENCOUNTER — Encounter (HOSPITAL_COMMUNITY): Payer: Medicaid Other

## 2017-01-07 ENCOUNTER — Encounter (HOSPITAL_COMMUNITY)
Admission: RE | Admit: 2017-01-07 | Payer: Medicaid Other | Source: Ambulatory Visit | Attending: Cardiovascular Disease | Admitting: Cardiovascular Disease

## 2017-01-10 ENCOUNTER — Encounter (HOSPITAL_COMMUNITY)
Admission: RE | Admit: 2017-01-10 | Discharge: 2017-01-10 | Disposition: A | Discharge status: Home / Self Care | Payer: Medicaid Other | Source: Ambulatory Visit | Attending: Cardiovascular Disease | Admitting: Cardiovascular Disease

## 2017-01-10 DIAGNOSIS — I213 ST elevation (STEMI) myocardial infarction of unspecified site: Secondary | ICD-10-CM | POA: Diagnosis not present

## 2017-01-10 NOTE — Progress Notes (Signed)
Daily Session Note  Patient Details  Name: Christine Harvey MRN: [id number] Date of Birth: 05-05-1956 Referring Provider:   Flowsheet Row CARDIAC REHAB PHASE II ORIENTATION from 09/21/2016 in [facility]  Referring Provider  Dr. Gwenlyn Found      Encounter Date: 01/10/2017  Check In:     Session Check In - 01/10/17 1100      Check-In   Location [facility]   Staff Present Aundra Dubin, RN, BSN;Deneen Slager Luther Parody, BS, EP, Exercise Physiologist   Supervising physician immediately available to respond to emergencies See telemetry face sheet for immediately available MD   Medication changes reported     No   Fall or balance concerns reported    No   Warm-up and Cool-down Performed as group-led instruction   Resistance Training Performed Yes   VAD Patient? No     Pain Assessment   Currently in Pain? No/denies   Pain Score 0-No pain   Multiple Pain Sites No      Capillary Blood Glucose: No results found for this or any previous visit (from the past 24 hour(s)).   Goals Met:  Independence with exercise equipment Exercise tolerated well No report of cardiac concerns or symptoms Strength training completed today  Goals Unmet:  Not Applicable  Comments: Check out 1200   Dr. Kate Sable is Medical Director for [place] and [facility] Re[facility].

## 2017-01-12 ENCOUNTER — Encounter (HOSPITAL_COMMUNITY)
Admission: RE | Admit: 2017-01-12 | Discharge: 2017-01-12 | Disposition: A | Discharge status: Home / Self Care | Payer: Medicaid Other | Source: Ambulatory Visit | Attending: Cardiovascular Disease | Admitting: Cardiovascular Disease

## 2017-01-12 DIAGNOSIS — I213 ST elevation (STEMI) myocardial infarction of unspecified site: Secondary | ICD-10-CM

## 2017-01-12 NOTE — Progress Notes (Signed)
Daily Session Note  Patient Details  Name: Christine Harvey MRN: [id number] Date of Birth: 10-25-1956 Referring Provider:   Flowsheet Row CARDIAC REHAB PHASE II ORIENTATION from 09/21/2016 in [facility]  Referring Provider  Dr. Gwenlyn Found      Encounter Date: 01/12/2017  Check In:     Session Check In - 01/12/17 1100      Check-In   Location [facility]   Staff Present Aundra Dubin, RN, BSN;Newell Wafer Luther Parody, BS, EP, Exercise Physiologist   Supervising physician immediately available to respond to emergencies See telemetry face sheet for immediately available MD   Medication changes reported     No   Fall or balance concerns reported    No   Warm-up and Cool-down Performed as group-led instruction   Resistance Training Performed Yes   VAD Patient? No     Pain Assessment   Currently in Pain? No/denies   Pain Score 0-No pain   Multiple Pain Sites No      Capillary Blood Glucose: No results found for this or any previous visit (from the past 24 hour(s)).   Goals Met:  Independence with exercise equipment Exercise tolerated well No report of cardiac concerns or symptoms Strength training completed today  Goals Unmet:  Not Applicable  Comments: Check out 1200   Dr. Kate Sable is Medical Director for [place] and [facility] Re[facility].

## 2017-01-14 ENCOUNTER — Encounter (HOSPITAL_COMMUNITY)
Admission: RE | Admit: 2017-01-14 | Discharge: 2017-01-14 | Disposition: A | Discharge status: Home / Self Care | Payer: Medicaid Other | Source: Ambulatory Visit | Attending: Cardiovascular Disease | Admitting: Cardiovascular Disease

## 2017-01-14 DIAGNOSIS — I213 ST elevation (STEMI) myocardial infarction of unspecified site: Secondary | ICD-10-CM

## 2017-01-14 NOTE — Progress Notes (Signed)
Daily Session Note  Patient Details  Name: Christine Harvey MRN: [id number] Date of Birth: 11-01-1956 Referring Provider:   Flowsheet Row CARDIAC REHAB PHASE II ORIENTATION from 09/21/2016 in [facility]  Referring Provider  Dr. Gwenlyn Found      Encounter Date: 01/14/2017  Check In:     Session Check In - 01/14/17 1100      Check-In   Location [facility]   Staff Present Suzanne Boron, BS, EP, Exercise Physiologist;Debra Wynetta Emery, RN, BSN   Supervising physician immediately available to respond to emergencies See telemetry face sheet for immediately available MD   Medication changes reported     No   Fall or balance concerns reported    No   Warm-up and Cool-down Performed as group-led instruction   Resistance Training Performed Yes   VAD Patient? No     Pain Assessment   Currently in Pain? No/denies   Pain Score 0-No pain   Multiple Pain Sites No      Capillary Blood Glucose: No results found for this or any previous visit (from the past 24 hour(s)).   Goals Met:  Independence with exercise equipment Exercise tolerated well No report of cardiac concerns or symptoms Strength training completed today  Goals Unmet:  Not Applicable  Comments: Check out 1200   Dr. Kate Sable is Medical Director for [place] and [facility]monary Re[facility].

## 2017-01-17 ENCOUNTER — Encounter (HOSPITAL_COMMUNITY)
Admission: RE | Admit: 2017-01-17 | Discharge: 2017-01-17 | Disposition: A | Discharge status: Home / Self Care | Payer: Medicaid Other | Source: Ambulatory Visit | Attending: Cardiovascular Disease | Admitting: Cardiovascular Disease

## 2017-01-17 ENCOUNTER — Telehealth: Payer: Self-pay

## 2017-01-17 ENCOUNTER — Emergency Department (HOSPITAL_COMMUNITY)
Admission: EM | Admit: 2017-01-17 | Discharge: 2017-01-17 | Discharge status: Against Medical Advice | Payer: Medicaid Other | Attending: Emergency Medicine | Admitting: Emergency Medicine

## 2017-01-17 ENCOUNTER — Telehealth: Payer: Self-pay | Admitting: Cardiovascular Disease

## 2017-01-17 ENCOUNTER — Emergency Department (HOSPITAL_COMMUNITY): Payer: Medicaid Other

## 2017-01-17 ENCOUNTER — Other Ambulatory Visit: Payer: Self-pay

## 2017-01-17 ENCOUNTER — Encounter (HOSPITAL_COMMUNITY): Payer: Self-pay | Admitting: Emergency Medicine

## 2017-01-17 DIAGNOSIS — Z7982 Long term (current) use of aspirin: Secondary | ICD-10-CM | POA: Diagnosis not present

## 2017-01-17 DIAGNOSIS — R0789 Other chest pain: Secondary | ICD-10-CM | POA: Diagnosis not present

## 2017-01-17 DIAGNOSIS — Z794 Long term (current) use of insulin: Secondary | ICD-10-CM | POA: Diagnosis not present

## 2017-01-17 DIAGNOSIS — Z79899 Other long term (current) drug therapy: Secondary | ICD-10-CM | POA: Diagnosis not present

## 2017-01-17 DIAGNOSIS — F17210 Nicotine dependence, cigarettes, uncomplicated: Secondary | ICD-10-CM | POA: Diagnosis not present

## 2017-01-17 DIAGNOSIS — I2510 Atherosclerotic heart disease of native coronary artery without angina pectoris: Secondary | ICD-10-CM | POA: Insufficient documentation

## 2017-01-17 DIAGNOSIS — R55 Syncope and collapse: Secondary | ICD-10-CM | POA: Diagnosis present

## 2017-01-17 DIAGNOSIS — I213 ST elevation (STEMI) myocardial infarction of unspecified site: Secondary | ICD-10-CM | POA: Diagnosis not present

## 2017-01-17 DIAGNOSIS — R42 Dizziness and giddiness: Secondary | ICD-10-CM | POA: Diagnosis not present

## 2017-01-17 DIAGNOSIS — E119 Type 2 diabetes mellitus without complications: Secondary | ICD-10-CM | POA: Diagnosis not present

## 2017-01-17 DIAGNOSIS — I10 Essential (primary) hypertension: Secondary | ICD-10-CM | POA: Insufficient documentation

## 2017-01-17 DIAGNOSIS — R5383 Other fatigue: Secondary | ICD-10-CM | POA: Insufficient documentation

## 2017-01-17 LAB — CBC WITH DIFFERENTIAL/PLATELET
Basophils Absolute: 0 10*3/uL (ref 0.0–0.1)
Basophils Relative: 0 %
Eosinophils Absolute: 0.2 10*3/uL (ref 0.0–0.7)
Eosinophils Relative: 3 %
HCT: 38.8 % (ref 36.0–46.0)
Hemoglobin: 12.3 g/dL (ref 12.0–15.0)
Lymphocytes Relative: 24 %
Lymphs Abs: 1.6 10*3/uL (ref 0.7–4.0)
MCH: 28 pg (ref 26.0–34.0)
MCHC: 31.7 g/dL (ref 30.0–36.0)
MCV: 88.2 fL (ref 78.0–100.0)
Monocytes Absolute: 0.5 10*3/uL (ref 0.1–1.0)
Monocytes Relative: 8 %
Neutro Abs: 4.4 10*3/uL (ref 1.7–7.7)
Neutrophils Relative %: 65 %
Platelets: 314 10*3/uL (ref 150–400)
RBC: 4.4 MIL/uL (ref 3.87–5.11)
RDW: 13.4 % (ref 11.5–15.5)
WBC: 6.8 10*3/uL (ref 4.0–10.5)

## 2017-01-17 LAB — COMPREHENSIVE METABOLIC PANEL
ALT: 27 U/L (ref 14–54)
AST: 40 U/L (ref 15–41)
Albumin: 3.3 g/dL — ABNORMAL LOW (ref 3.5–5.0)
Alkaline Phosphatase: 83 U/L (ref 38–126)
Anion gap: 6 (ref 5–15)
BUN: 12 mg/dL (ref 6–20)
CO2: 27 mmol/L (ref 22–32)
Calcium: 9.3 mg/dL (ref 8.9–10.3)
Chloride: 105 mmol/L (ref 101–111)
Creatinine, Ser: 1.26 mg/dL — ABNORMAL HIGH (ref 0.44–1.00)
GFR calc Af Amer: 52 mL/min — ABNORMAL LOW (ref >60)
GFR calc non Af Amer: 45 mL/min — ABNORMAL LOW (ref >60)
Glucose, Bld: 159 mg/dL — ABNORMAL HIGH (ref 65–99)
Potassium: 3.9 mmol/L (ref 3.5–5.1)
Sodium: 138 mmol/L (ref 135–145)
Total Bilirubin: 0.3 mg/dL (ref 0.3–1.2)
Total Protein: 7.6 g/dL (ref 6.5–8.1)

## 2017-01-17 LAB — TROPONIN I: Troponin I: <0.03 ng/mL (ref <0.03)

## 2017-01-17 LAB — GLUCOSE, CAPILLARY: Glucose-Capillary: 172 mg/dL — ABNORMAL HIGH (ref 65–99)

## 2017-01-17 NOTE — ED Notes (Signed)
Report given to Cardiac Rehab. Pt advise to come back with any changes

## 2017-01-17 NOTE — Progress Notes (Signed)
Incomplete Session Note  Patient Details  Name: Christine Harvey MRN: [id number] Date of Birth: 04-29-1956 Referring Provider:   Flowsheet Row CARDIAC REHAB PHASE II ORIENTATION from 09/21/2016 in [facility]  Referring Provider  Dr. Tish Men did not complete her rehab session.  Patient c/o dizziness during the weight warm-up. Her b/p was 190/100 and her glucose was 172 mg/dl. She c/o feeling funny in her chest with nausea. Rapid response team was called. Patient was taken to the ED for observation.

## 2017-01-17 NOTE — ED Triage Notes (Signed)
PT was starting to do her workouts in cardiac rehab today and started to feel dizzy, headache and had a syncopal episode. Rapid response arrival to cardiac rehab pt was alter and oriented and stated some pressure to the center of her chest. CBG 172 in cardia rehab.

## 2017-01-17 NOTE — Telephone Encounter (Signed)
STATES PATIENT had an episode today at cardiac rehab [facility]) Patient was sent to ER by RAPID respose team.  patient did not stay to be evaluated per [facility]  symptoms - diaphoretic , slurred speech   patient states she has an upcoming appointment with someone Wednesday.  Reviewed chart - patienT has an appointment with Dr Domenic Polite 1/31/8.   Per Levander Campion , patient need clearance from cardiology to return back to cardiac rehab       Forward  Message to [place] office.

## 2017-01-17 NOTE — ED Notes (Signed)
LAb at the bedside, pt not wanting IV

## 2017-01-17 NOTE — Telephone Encounter (Signed)
Chart reviewed. I have never seen this patient before. She saw Ms. Lawrence NP last summer. I will have to meet her at the pending office visit and review her records to determine what we may need to do next.

## 2017-01-17 NOTE — Telephone Encounter (Signed)
New message      Talk to Dr Kennon Holter nurse.  She would not tell me what she wanted

## 2017-01-17 NOTE — Telephone Encounter (Signed)
I spoke with patient,states she feels fine,will go back to ED if anything changes and will see Dr Domenic Polite in 2 days at scheduled apt

## 2017-01-17 NOTE — ED Notes (Signed)
Pt out of dept to front entrance

## 2017-01-17 NOTE — ED Notes (Signed)
Pt denies pain, xray at the bedside pt refusing xray. Pt states she feels fine and want to sign out, She has doctors appointment Wed and will see them before returning to cardiac rehab.

## 2017-01-17 NOTE — Telephone Encounter (Signed)
Received call from Russella Dar in [place].Pt  Needs cardiac clearance from MD to return to cardiac rehab.During warm up this am with 2 lb weights,patient became sweaty, dizzy and noted to have slurred words.Blood sugar was normal per Russella Dar.As she took ker BP  190/110, pt made sound and said something funny was going on in her chest.Rapid Response Team was called and patient was taken to ED.Patient apparently declined any treatment and left AMA. Dianne told her she would need cardiac clearance to return and she told Diane she has an apt with her cardiologist this Wednesday.   Will FYI her provider

## 2017-01-18 ENCOUNTER — Encounter: Payer: Self-pay | Admitting: Cardiology

## 2017-01-18 NOTE — Progress Notes (Signed)
Cardiology Office Note  Date: 01/19/2017   ID: [name], DOB 1956/07/19, MRN [id number]  PCP: Christine Savage, MD  Evaluating Cardiologist: Rozann Lesches, MD   Chief Complaint  Patient presents with  . Coronary Artery Disease    History of Present Illness: Christine Harvey is a 61 y.o. female that I am meeting for the first time in clinic today. I reviewed her records and updated the chart. Cardiac history includes inferolateral STEMI back in August 2017 at which point she was managed medically for a severely stenosed first diagonal branch that was unable to be intervened upon as well as associated moderate disease within the circumflex and RCA. She saw Ms. Lawrence NP in August of last year. She has been participating in cardiac rehabilitation. Reportedly while in cardiac rehabilitation on January 29 she developed an episode of diaphoresis and slurring of her speech while using arm weights (glucose normal, BP 190/110), was referred to the ER but left without further evaluation.  She presents today and we discussed her symptoms. She states that in general she has done well on medications. She has used nitroglycerin a few times since her hospitalization. We did go over her medication list which is outlined below. I discussed the results of her cardiac catheterization, and it would not be surprising that she experiences angina intermittently. We discussed trying the edition of Imdur as an antianginal, would likely replace HCTZ.  Past Medical History:  Diagnosis Date  . 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  some iInvolvement long the Clival bone and Effect upon the Pons - s/p sterotactic radiosurgery in 2014.  Marland Kitchen CAD (coronary artery disease)    80-99% first diagonal (unable to be intervened upon), 60% circumflex, 75% RCA August 2017  . Depression   . Essential hypertension   . Frequent headaches   . Hyperlipidemia   . Morbid  obesity (Christine Harvey)   . Osteoarthritis   . Photophobia    With Headaches  . S/P radiation therapy 05/02/13   25 Gy at 5 Gy per fraction, last treatment on 04/30/13  . ST elevation myocardial infarction (STEMI) of inferolateral wall [facility])    August 2017  . Type 2 diabetes mellitus (Christine Harvey)     Past Surgical History:  Procedure Laterality Date  . CARDIAC CATHETERIZATION N/A 07/31/2016   Procedure: Left Heart Cath and Coronary Angiography;  Surgeon: Christine Harp, MD;  Location: [facility];  Service: Cardiovascular;  Laterality: N/A;  . TONSILLECTOMY     As a child    Current Outpatient Prescriptions  Medication Sig Dispense Refill  . acyclovir (ZOVIRAX) 200 MG capsule Take 200 mg by mouth as needed (for cold sores).     Marland Kitchen aspirin EC 81 MG EC tablet Take 1 tablet (81 mg total) by mouth daily.    Marland Kitchen atorvastatin (LIPITOR) 80 MG tablet Take 1 tablet (80 mg total) by mouth daily at 6 PM. 30 tablet 12  . Blood Glucose Monitoring Suppl (ACCU-CHEK AVIVA) device Use as instructed 1 each 0  . calcipotriene-betamethasone (TACLONEX SCALP) external suspension Apply topically.    . carvedilol (COREG) 25 MG tablet TAKE 1 TABLET BY MOUTH TWICE DAILY WITH A MEAL.  12  . clobetasol (TEMOVATE) 0.05 % external solution Apply to affected areas on scalp once daily. Never to face.    . Clobetasol Propionate (CLOBEX) 0.05 % shampoo APPLY TO SCALP ONCE DAILY  IN SHOWER, THEN RINSE.    Marland Kitchen clopidogrel (PLAVIX) 75 MG tablet Take 1 tablet (75 mg total) by mouth daily with breakfast. 30 tablet 12  . FLUOCINOLONE ACETONIDE SCALP 0.01 % OIL APPLY TO SCALP EVERY DAY AS DIRECTED    . glucose blood (ACCU-CHEK AVIVA) test strip Test glucose 4 times a day E11.65 150 each 3  . HYDROcodone-acetaminophen (NORCO) 7.5-325 MG tablet One every six hours for pain as needed.  Do not drive car or operate machinery while taking this medicine.  Must last 14 days. 20 tablet 0  . Insulin Glargine (LANTUS SOLOSTAR) 100 UNIT/ML Solostar Pen  Inject 50 Units into the skin at bedtime.    Christine Harvey Devices (ACCU-CHEK SOFTCLIX) lancets Use as instructed 1 each 0  . Lancets MISC Test 4 x daily. E11.65. Accu-Chek Aviva 200 each 5  . losartan (COZAAR) 100 MG tablet Take 1 tablet (100 mg total) by mouth daily. 30 tablet 12  . metFORMIN (GLUCOPHAGE) 500 MG tablet TAKE 1 TABLET BY MOUTH TWICE DAILY WITH A MEAL 60 tablet 2  . nitroGLYCERIN (NITROSTAT) 0.4 MG SL tablet Place 1 tablet (0.4 mg total) under the tongue every 5 (five) minutes x 3 doses as needed for chest pain. 25 tablet 2  . isosorbide mononitrate (IMDUR) 30 MG 24 hr tablet Take 1 tablet (30 mg total) by mouth daily. 90 tablet 3   No current facility-administered medications for this visit.    Allergies:  Sulfa antibiotics   Social History: The patient  reports that she has been smoking Cigarettes.  She has never used smokeless tobacco. She reports that she drinks alcohol. She reports that she does not use drugs.   ROS:  Please see the history of present illness. Otherwise, complete review of systems is positive for none.  All other systems are reviewed and negative.   Physical Exam: VS:  BP 112/78   Pulse 66   Ht 5\' 3"  (1.6 m)   Wt 182 lb (82.6 kg)   SpO2 99%   BMI 32.24 kg/m , BMI Body mass index is 32.24 kg/m.  Wt Readings from Last 3 Encounters:  01/19/17 182 lb (82.6 kg)  01/17/17 183 lb (83 kg)  10/26/16 180 lb (81.6 kg)    General: Patient appears comfortable at rest. HEENT: Conjunctiva and lids normal, oropharynx clear. Neck: Supple, no elevated JVP or carotid bruits, no thyromegaly. Lungs: Clear to auscultation, nonlabored breathing at rest. Cardiac: Regular rate and rhythm, no S3, 2/6 systolic murmur, no pericardial rub. Abdomen: Soft, nontender, bowel sounds present, no guarding or rebound. Extremities: No pitting edema, distal pulses 2+. Skin: Warm and dry. Musculoskeletal: No kyphosis. Neuropsychiatric: Alert and oriented x3, affect grossly  appropriate.  ECG: I personally reviewed the tracing from 01/17/2017 which showed sinus rhythm with nonspecific ST changes.  Recent Labwork: 01/17/2017: ALT 27; AST 40; BUN 12; Creatinine, Ser 1.26; Hemoglobin 12.3; Platelets 314; Potassium 3.9; Sodium 138     Component Value Date/Time   CHOL 90 11/24/2016 1040   TRIG 135 11/24/2016 1040   HDL 33 (L) 11/24/2016 1040   CHOLHDL 2.7 11/24/2016 1040   VLDL 27 11/24/2016 1040   LDLCALC 30 11/24/2016 1040    Other Studies Reviewed Today:  Echocardiogram 08/01/2016: Study Conclusions  - Left ventricle: The cavity size was normal. Systolic function was   normal. Hypokinesis of the anterolateral myocardium. Doppler   parameters are consistent with abnormal left ventricular   relaxation (grade 1 diastolic dysfunction). Doppler parameters  are consistent with high ventricular filling pressure. - Aortic valve: Functionally bicuspid; mildly thickened, mildly   calcified leaflets. Valve mobility was restricted. There was mild   stenosis. There was no regurgitation. Valve area (VTI): 1.69   cm^2. Valve area (Vmax): 1.61 cm^2. Valve area (Vmean): 1.38   cm^2. - Mitral valve: Transvalvular velocity was within the normal range.   There was no evidence for stenosis. There was trivial   regurgitation. - Left atrium: The atrium was moderately dilated. - Right ventricle: The cavity size was normal. Wall thickness was   normal. Systolic function was normal. - Tricuspid valve: There was trivial regurgitation. - Pulmonary arteries: Systolic pressure was within the normal   range. PA peak pressure: 20 mm Hg (S).  Cardiac catheterization 07/31/2016:  Mid RCA lesion, 75 %stenosed.  Prox Cx to Mid Cx lesion, 60 %stenosed.  Ost 1st Diag to 1st Diag lesion, 99 %stenosed.  1st Diag lesion, 80 %stenosed.  There is mild left ventricular systolic dysfunction.  The left ventricular ejection fraction is 50-55% by visual estimate.  IMPRESSION:  Unsuccessful attempt at PCI of a high-grade high first diagonal branch in the setting of a high lateral STEMI. Because of the angulation takeoff, tortuosity of the vessel and calcification of the lesion I was unable to cross the lesion successfully and intervene. The patient was pain-free at the end of the procedure. I recommend continued medical therapy.  Assessment and Plan:  1. CAD status post inferolateral STEMI in August 2017, unsuccessful PCI attempt to address high-grade diagonal stenosis, and residual disease including a 75% RCA and 60% circumflex. She is being managed medically at this point, and it is not surprising that she would be having intermittent angina symptoms. We discussed initiating Imdur 30 mg daily, will stop HCTZ for now. Otherwise continue with present regimen. Can attempt resumption of cardiac rehabilitation next Monday to see how she does.  2. Essential hypertension, blood pressure control is good today.  3. Hyperlipidemia, on Lipitor. LDL was 30 and the summer 2017.  4. Type 2 diabetes mellitus, follows with Dr. Wenda Overland.  Current medicines were reviewed with the patient today.  Disposition: Follow-up in 3 months.  Signed, Satira Sark, MD, [facility] 01/19/2017 10:32 AM    [facility]. [address] [place], [place] [address] Phone: [phone]; Fax: [phone]

## 2017-01-18 NOTE — ED Provider Notes (Signed)
[facility] DEPT Provider Note   CSN: [id number] Arrival date & time: 01/17/17  1125     History   Chief Complaint Chief Complaint  Patient presents with  . Loss of Consciousness    HPI Christine Harvey is a 61 y.o. female.  Patient presents with lightheadedness and near syncope while starting cardiac rehabilitation. Patient is done this in the past and tolerated it well. Patient had no head injury or complete syncope. Patient had no significant chest pain. Patient did have cardiac event recently is being followed by outpatient cardiology. Patient feels mild improvement since starting her symptoms.      Past Medical History:  Diagnosis Date  . 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  some iInvolvement long the Clival bone and Effect upon the Pons - s/p sterotactic radiosurgery in 2014.  Marland Kitchen CAD (coronary artery disease)    80-99% first diagonal (unable to be intervened upon), 60% circumflex, 75% RCA August 2017  . Depression   . Essential hypertension   . Frequent headaches   . Hyperlipidemia   . Morbid obesity ([place])   . Osteoarthritis   . Photophobia    With Headaches  . S/P radiation therapy 05/02/13   25 Gy at 5 Gy per fraction, last treatment on 04/30/13  . ST elevation myocardial infarction (STEMI) of inferolateral wall [facility])    August 2017  . Type 2 diabetes mellitus [facility])     Patient Active Problem List   Diagnosis Date Noted  . Obesity 08/31/2016  . ST elevation myocardial infarction (STEMI) of inferolateral wall ([place]) 07/31/2016  . Acute ST elevation myocardial infarction (STEMI) of lateral wall ([place]) 07/31/2016  . STEMI (ST elevation myocardial infarction) ([place]) 07/31/2016  . Benign meningioma ([place]) 05/13/2016  . Type 2 diabetes mellitus, uncontrolled ([employer]) 02/18/2016  . Essential hypertension, benign 02/18/2016  . Hyperlipidemia 02/18/2016  . Right knee pain 01/23/2016  . Meningioma ([place]) 03/27/2013     Past Surgical History:  Procedure Laterality Date  . CARDIAC CATHETERIZATION N/A 07/31/2016   Procedure: Left Heart Cath and Coronary Angiography;  Surgeon: Lorretta Harp, MD;  Location: [facility];  Service: Cardiovascular;  Laterality: N/A;  . TONSILLECTOMY     As a child    OB History    Gravida Para Term Preterm AB Living   1       1 0   SAB TAB Ectopic Multiple Live Births   1               Home Medications    Prior to Admission medications   Medication Sig Start Date End Date Taking? Authorizing Provider  acyclovir (ZOVIRAX) 200 MG capsule Take 200 mg by mouth as needed (for cold sores).     Historical Provider, MD  aspirin EC 81 MG EC tablet Take 1 tablet (81 mg total) by mouth daily. 08/03/16   Arbutus Leas, NP  atorvastatin (LIPITOR) 80 MG tablet Take 1 tablet (80 mg total) by mouth daily at 6 PM. 08/03/16   Arbutus Leas, NP  Blood Glucose Monitoring Suppl (ACCU-CHEK AVIVA) device Use as instructed 02/18/16   Cassandria Anger, MD  calcipotriene-betamethasone [facility] SCALP) external suspension Apply topically. 09/17/15   Historical Provider, MD  carvedilol (COREG) 25 MG tablet TAKE 1 TABLET BY MOUTH TWICE DAILY WITH A MEAL. 08/03/16   Historical Provider, MD  clobetasol (TEMOVATE) 0.05 % external solution  Apply to affected areas on scalp once daily. Never to face. 11/28/15   Historical Provider, MD  Clobetasol Propionate (CLOBEX) 0.05 % shampoo APPLY TO SCALP ONCE DAILY IN SHOWER, THEN RINSE. 02/19/16   Historical Provider, MD  clopidogrel (PLAVIX) 75 MG tablet Take 1 tablet (75 mg total) by mouth daily with breakfast. 08/03/16   Arbutus Leas, NP  FLUOCINOLONE ACETONIDE SCALP 0.01 % OIL APPLY TO SCALP EVERY DAY AS DIRECTED 09/07/16   Historical Provider, MD  glucose blood (ACCU-CHEK AVIVA) test strip Test glucose 4 times a day E11.65 02/19/16   Cassandria Anger, MD  hydrochlorothiazide (HYDRODIURIL) 25 MG tablet Take 25 mg by mouth.    Historical Provider, MD   HYDROcodone-acetaminophen (NORCO) 7.5-325 MG tablet One every six hours for pain as needed.  Do not drive car or operate machinery while taking this medicine.  Must last 14 days. 12/08/16   Sanjuana Kava, MD  Insulin Glargine (LANTUS SOLOSTAR) 100 UNIT/ML Solostar Pen Inject 50 Units into the skin at bedtime.    Historical Provider, MD  INVOKANA 100 MG TABS tablet TAKE 1 TABLET BY MOUTH DAILY BEFORE BREAKFAST. 11/04/16   Cassandria Anger, MD  Lancet Devices [facility]) lancets Use as instructed 08/31/16   Cassandria Anger, MD  Lancets MISC Test 4 x daily. E11.65. Accu-Chek Aviva 08/18/16   Cassandria Anger, MD  losartan (COZAAR) 100 MG tablet Take 1 tablet (100 mg total) by mouth daily. 08/03/16   Arbutus Leas, NP  metFORMIN (GLUCOPHAGE) 500 MG tablet TAKE 1 TABLET BY MOUTH TWICE DAILY WITH A MEAL 11/04/16   Cassandria Anger, MD  nitroGLYCERIN (NITROSTAT) 0.4 MG SL tablet Place 1 tablet (0.4 mg total) under the tongue every 5 (five) minutes x 3 doses as needed for chest pain. 08/03/16   Arbutus Leas, NP  potassium chloride SA (KLOR-CON M20) 20 MEQ tablet Take 1 tablet (20 mEq total) by mouth daily. 08/12/16 11/10/16  Lendon Colonel, NP    Family History Family History  Problem Relation Age of Onset  . Hypertension Mother     MI in her 50's to 72's.  . CAD Mother   . Hypertension Father   . Stroke Father   . Cancer Sister     Social History Social History  Substance Use Topics  . Smoking status: Light Tobacco Smoker    Types: Cigarettes  . Smokeless tobacco: Never Used  . Alcohol use Yes     Comment: 2010 -  Alcohol Rehabilitation/ Drinks Occassionally     Allergies   Sulfa antibiotics   Review of Systems Review of Systems  Constitutional: Positive for fatigue. Negative for chills and fever.  HENT: Negative for ear pain and sore throat.   Eyes: Negative for pain and visual disturbance.  Respiratory: Negative for cough and shortness of breath.    Cardiovascular: Positive for chest pain. Negative for palpitations.  Gastrointestinal: Negative for abdominal pain and vomiting.  Genitourinary: Negative for dysuria and hematuria.  Musculoskeletal: Negative for arthralgias and back pain.  Skin: Negative for color change and rash.  Neurological: Positive for light-headedness. Negative for seizures and syncope.  All other systems reviewed and are negative.    Physical Exam Updated Vital Signs BP 187/69   Pulse (!) 59   Temp 97.8 F (36.6 C) (Oral)   Resp 18   Ht 5\' 3"  (1.6 m)   Wt 183 lb (83 kg)   SpO2 98%   BMI 32.42 kg/m  Physical Exam  Constitutional: She appears well-developed and well-nourished. No distress.  HENT:  Head: Normocephalic and atraumatic.  Eyes: Conjunctivae are normal.  Neck: Neck supple.  Cardiovascular: Normal rate.   No murmur heard. Pulmonary/Chest: Effort normal.  Abdominal: Soft.  Neurological: She is alert.  Skin: Skin is warm and dry.  Psychiatric: She has a normal mood and affect.  Nursing note and vitals reviewed.    ED Treatments / Results  Labs (all labs ordered are listed, but only abnormal results are displayed) Labs Reviewed  COMPREHENSIVE METABOLIC PANEL - Abnormal; Notable for the following:       Result Value   Glucose, Bld 159 (*)    Creatinine, Ser 1.26 (*)    Albumin 3.3 (*)    GFR calc non Af Amer 45 (*)    GFR calc Af Amer 52 (*)    All other components within normal limits  CBC WITH DIFFERENTIAL/PLATELET  TROPONIN I    EKG  EKG Interpretation  Date/Time:  Monday January 17 2017 11:27:42 EST Ventricular Rate:  55 PR Interval:    QRS Duration: 97 QT Interval:  441 QTC Calculation: 422 R Axis:   65 Text Interpretation:  Sinus rhythm Abnormal R-wave progression, early transition Baseline wander in lead(s) III No significant change was found Confirmed by Florina Ou  MD, Jenny Reichmann ([id number]) on 01/18/2017 1:52:59 PM       Radiology No results  found.  Procedures Procedures (including critical care time)  Medications Ordered in ED Medications - No data to display   Initial Impression / Assessment and Plan / ED Course  I have reviewed the triage vital signs and the nursing notes.  Pertinent labs & imaging results that were available during my care of the patient were reviewed by me and considered in my medical decision making (see chart for details).   patient presents after lightheadedness and mild chest pressure during cardiac rehabilitation. Patient's cardiac history and plan for observation the ER and discussed with cardiology. Patient eloped and per nursing discussion said she 1 to leave in follow-up in the office. I was unable to discuss anything further she was not in the room.    Final Clinical Impressions(s) / ED Diagnoses   Final diagnoses:  Syncope and collapse    New Prescriptions Discharge Medication List as of 01/17/2017 12:14 PM       Elnora Morrison, MD 01/18/17 1655

## 2017-01-19 ENCOUNTER — Encounter (HOSPITAL_COMMUNITY): Payer: Medicaid Other

## 2017-01-19 ENCOUNTER — Ambulatory Visit (INDEPENDENT_AMBULATORY_CARE_PROVIDER_SITE_OTHER): Payer: Medicaid Other | Admitting: Cardiology

## 2017-01-19 ENCOUNTER — Encounter: Payer: Self-pay | Admitting: Cardiology

## 2017-01-19 VITALS — BP 112/78 | HR 66 | Ht 63.0 in | Wt 182.0 lb

## 2017-01-19 DIAGNOSIS — E1159 Type 2 diabetes mellitus with other circulatory complications: Secondary | ICD-10-CM | POA: Diagnosis not present

## 2017-01-19 DIAGNOSIS — I25119 Atherosclerotic heart disease of native coronary artery with unspecified angina pectoris: Secondary | ICD-10-CM | POA: Diagnosis not present

## 2017-01-19 DIAGNOSIS — I10 Essential (primary) hypertension: Secondary | ICD-10-CM | POA: Diagnosis not present

## 2017-01-19 DIAGNOSIS — E782 Mixed hyperlipidemia: Secondary | ICD-10-CM

## 2017-01-19 MED ORDER — ISOSORBIDE MONONITRATE ER 30 MG PO TB24: 30.0000 mg | ORAL_TABLET | Freq: Every day | ORAL | 3 refills | Status: AC

## 2017-01-19 NOTE — Patient Instructions (Signed)
Medication Instructions:  Stop hydrochlorothiazide  Start IMDUR 30 MG - ONCE DAILY   Labwork: NONE  Testing/Procedures: NONE  Follow-Up: Your physician recommends that you schedule a follow-up appointment in: 3 MONTHS    Any Other Special Instructions Will Be Listed Below (If Applicable).     If you need a refill on your cardiac medications before your next appointment, please call your pharmacy.

## 2017-01-21 ENCOUNTER — Encounter (HOSPITAL_COMMUNITY)
Admission: RE | Admit: 2017-01-21 | Payer: Medicaid Other | Source: Ambulatory Visit | Attending: Cardiovascular Disease | Admitting: Cardiovascular Disease

## 2017-01-24 ENCOUNTER — Encounter (HOSPITAL_COMMUNITY): Payer: Medicaid Other

## 2017-01-26 ENCOUNTER — Encounter (HOSPITAL_COMMUNITY)
Admission: RE | Admit: 2017-01-26 | Discharge: 2017-01-26 | Disposition: A | Discharge status: Home / Self Care | Payer: Medicaid Other | Source: Ambulatory Visit | Attending: Cardiovascular Disease | Admitting: Cardiovascular Disease

## 2017-01-26 ENCOUNTER — Ambulatory Visit (INDEPENDENT_AMBULATORY_CARE_PROVIDER_SITE_OTHER): Payer: Medicaid Other | Admitting: Orthopaedic Surgery

## 2017-01-26 VITALS — BP 199/95 | HR 66 | Temp 97.0°F | Ht 63.0 in | Wt 178.0 lb

## 2017-01-26 DIAGNOSIS — I10 Essential (primary) hypertension: Secondary | ICD-10-CM

## 2017-01-26 DIAGNOSIS — M25561 Pain in right knee: Secondary | ICD-10-CM

## 2017-01-26 DIAGNOSIS — I213 ST elevation (STEMI) myocardial infarction of unspecified site: Secondary | ICD-10-CM | POA: Insufficient documentation

## 2017-01-26 DIAGNOSIS — Z720 Tobacco use: Secondary | ICD-10-CM

## 2017-01-26 DIAGNOSIS — G8929 Other chronic pain: Secondary | ICD-10-CM

## 2017-01-26 MED ORDER — HYDROCODONE-ACETAMINOPHEN 5-325 MG PO TABS: ORAL_TABLET | ORAL | 0 refills | Status: DC

## 2017-01-26 NOTE — Patient Instructions (Signed)
Steps to Quit Smoking Smoking tobacco can be bad for your health. It can also affect almost every organ in your body. Smoking puts you and people around you at risk for many serious Christine Harvey-lasting (chronic) diseases. Quitting smoking is hard, but it is one of the best things that you can do for your health. It is never too late to quit. What are the benefits of quitting smoking? When you quit smoking, you lower your risk for getting serious diseases and conditions. They can include:  Lung cancer or lung disease.  Heart disease.  Stroke.  Heart attack.  Not being able to have children (infertility).  Weak bones (osteoporosis) and broken bones (fractures). If you have coughing, wheezing, and shortness of breath, those symptoms may get better when you quit. You may also get sick less often. If you are pregnant, quitting smoking can help to lower your chances of having a baby of low birth weight. What can I do to help me quit smoking? Talk with your doctor about what can help you quit smoking. Some things you can do (strategies) include:  Quitting smoking totally, instead of slowly cutting back how much you smoke over a period of time.  Going to in-person counseling. You are more likely to quit if you go to many counseling sessions.  Using resources and support systems, such as:  Online chats with a counselor.  Phone quitlines.  Printed self-help materials.  Support groups or group counseling.  Text messaging programs.  Mobile phone apps or applications.  Taking medicines. Some of these medicines may have nicotine in them. If you are pregnant or breastfeeding, do not take any medicines to quit smoking unless your doctor says it is okay. Talk with your doctor about counseling or other things that can help you. Talk with your doctor about using more than one strategy at the same time, such as taking medicines while you are also going to in-person counseling. This can help make quitting  easier. What things can I do to make it easier to quit? Quitting smoking might feel very hard at first, but there is a lot that you can do to make it easier. Take these steps:  Talk to your family and friends. Ask them to support and encourage you.  Call phone quitlines, reach out to support groups, or work with a counselor.  Ask people who smoke to not smoke around you.  Avoid places that make you want (trigger) to smoke, such as:  Bars.  Parties.  Smoke-break areas at work.  Spend time with people who do not smoke.  Lower the stress in your life. Stress can make you want to smoke. Try these things to help your stress:  Getting regular exercise.  Deep-breathing exercises.  Yoga.  Meditating.  Doing a body scan. To do this, close your eyes, focus on one area of your body at a time from head to toe, and notice which parts of your body are tense. Try to relax the muscles in those areas.  Download or buy apps on your mobile phone or tablet that can help you stick to your quit plan. There are many free apps, such as QuitGuide from the CDC (Centers for Disease Control and Prevention). You can find more support from smokefree.gov and other websites. This information is not intended to replace advice given to you by your health care provider. Make sure you discuss any questions you have with your health care provider. Document Released: 10/02/2009 Document Revised: 08/03/2016 Document   Reviewed: 04/22/2015 Elsevier Interactive Patient Education  2017 Elsevier Inc.  

## 2017-01-26 NOTE — Progress Notes (Signed)
Patient [id number] Christine Harvey, female DOB:Apr 26, 1956, 61 y.o. [id number]  Chief Complaint  Patient presents with  . Follow-up    Chronic pain of right knee    HPI  Christine Harvey is a 61 y.o. female who has chronic pain of the right knee.  She has no new trauma, no giving way, no redness. She has swelling and popping.   HPI  Body mass index is 31.53 kg/m.  ROS  Review of Systems  HENT: Negative for congestion.   Eyes: Negative.   Respiratory: Negative for cough and shortness of breath.   Cardiovascular: Negative for chest pain and leg swelling.       Hypertension.  Endocrine:       Diabetes mellitus on insulin  Musculoskeletal: Positive for arthralgias, gait problem and joint swelling.  Neurological: Positive for headaches.       Post benign brain tumor, no residual except slight weakness of the left lower leg at times, varies.    Past Medical History:  Diagnosis Date  . 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  some iInvolvement long the Clival bone and Effect upon the Pons - s/p sterotactic radiosurgery in 2014.  Marland Kitchen CAD (coronary artery disease)    80-99% first diagonal (unable to be intervened upon), 60% circumflex, 75% RCA August 2017  . Depression   . Essential hypertension   . Frequent headaches   . Hyperlipidemia   . Morbid obesity ([place])   . Osteoarthritis   . Photophobia    With Headaches  . S/P radiation therapy 05/02/13   25 Gy at 5 Gy per fraction, last treatment on 04/30/13  . ST elevation myocardial infarction (STEMI) of inferolateral wall [facility])    August 2017  . Type 2 diabetes mellitus ([place])     Past Surgical History:  Procedure Laterality Date  . CARDIAC CATHETERIZATION N/A 07/31/2016   Procedure: Left Heart Cath and Coronary Angiography;  Surgeon: Lorretta Harp, MD;  Location: [facility];  Service: Cardiovascular;  Laterality: N/A;  . TONSILLECTOMY     As a child    Family History   Problem Relation Age of Onset  . Hypertension Mother     MI in her 56's to 57's.  . CAD Mother   . Hypertension Father   . Stroke Father   . Cancer Sister     Social History Social History  Substance Use Topics  . Smoking status: Light Tobacco Smoker    Types: Cigarettes  . Smokeless tobacco: Never Used  . Alcohol use Yes     Comment: 2010 -  Alcohol Rehabilitation/ Drinks Occassionally    Allergies  Allergen Reactions  . Sulfa Antibiotics Other (See Comments)    Passes out    Current Outpatient Prescriptions  Medication Sig Dispense Refill  . acyclovir (ZOVIRAX) 200 MG capsule Take 200 mg by mouth as needed (for cold sores).     Marland Kitchen aspirin EC 81 MG EC tablet Take 1 tablet (81 mg total) by mouth daily.    Marland Kitchen atorvastatin (LIPITOR) 80 MG tablet Take 1 tablet (80 mg total) by mouth daily at 6 PM. 30 tablet 12  . Blood Glucose Monitoring Suppl (ACCU-CHEK AVIVA) device Use as instructed 1 each 0  . calcipotriene-betamethasone (TACLONEX SCALP) external suspension Apply topically.    . carvedilol (COREG) 25 MG tablet TAKE 1 TABLET BY MOUTH TWICE DAILY WITH A MEAL.  12  .  clobetasol (TEMOVATE) 0.05 % external solution Apply to affected areas on scalp once daily. Never to face.    . Clobetasol Propionate (CLOBEX) 0.05 % shampoo APPLY TO SCALP ONCE DAILY IN SHOWER, THEN RINSE.    Marland Kitchen clopidogrel (PLAVIX) 75 MG tablet Take 1 tablet (75 mg total) by mouth daily with breakfast. 30 tablet 12  . FLUOCINOLONE ACETONIDE SCALP 0.01 % OIL APPLY TO SCALP EVERY DAY AS DIRECTED    . glucose blood (ACCU-CHEK AVIVA) test strip Test glucose 4 times a day E11.65 150 each 3  . HYDROcodone-acetaminophen (NORCO/VICODIN) 5-325 MG tablet One tablet by mouth every six hours as needed for pain.  Seven day limit per Medicaid guidelines. 28 tablet 0  . Insulin Glargine (LANTUS SOLOSTAR) 100 UNIT/ML Solostar Pen Inject 50 Units into the skin at bedtime.    . isosorbide mononitrate (IMDUR) 30 MG 24 hr tablet Take  1 tablet (30 mg total) by mouth daily. 90 tablet 3  . Lancet Devices (ACCU-CHEK SOFTCLIX) lancets Use as instructed 1 each 0  . Lancets MISC Test 4 x daily. E11.65. Accu-Chek Aviva 200 each 5  . losartan (COZAAR) 100 MG tablet Take 1 tablet (100 mg total) by mouth daily. 30 tablet 12  . metFORMIN (GLUCOPHAGE) 500 MG tablet TAKE 1 TABLET BY MOUTH TWICE DAILY WITH A MEAL 60 tablet 2  . nitroGLYCERIN (NITROSTAT) 0.4 MG SL tablet Place 1 tablet (0.4 mg total) under the tongue every 5 (five) minutes x 3 doses as needed for chest pain. 25 tablet 2   No current facility-administered medications for this visit.      Physical Exam  Blood pressure (!) 199/95, pulse 66, temperature 97 F (36.1 C), height 5\' 3"  (1.6 m), weight 178 lb (80.7 kg).  Constitutional: overall normal hygiene, normal nutrition, well developed, normal grooming, normal body habitus. Assistive device:none  Musculoskeletal: gait and station Limp right, muscle tone and strength are normal, no tremors or atrophy is present.  .  Neurological: coordination overall normal.  Deep tendon reflex/nerve stretch intact.  Sensation normal.  Cranial nerves II-XII intact.   Skin:   Normal overall no scars, lesions, ulcers or rashes. No psoriasis.  Psychiatric: Alert and oriented x 3.  Recent memory intact, remote memory unclear.  Normal mood and affect. Well groomed.  Good eye contact.  Cardiovascular: overall no swelling, no varicosities, no edema bilaterally, normal temperatures of the legs and arms, no clubbing, cyanosis and good capillary refill.  Lymphatic: palpation is normal. The right lower extremity is examined:  Inspection:  Thigh:  Non-tender and no defects  Knee has swelling 1+ effusion.                        Joint tenderness is present                        Patient is tender over the medial joint line  Lower Leg:  Has normal appearance and no tenderness or defects  Ankle:  Non-tender and no defects  Foot:  Non-tender  and no defects Range of Motion:  Knee:  Range of motion is: 0-105                        Crepitus is  present  Ankle:  Range of motion is normal. Strength and Tone:  The right lower extremity has normal strength and tone. Stability:  Knee:  The knee is stable.  Ankle:  The ankle is stable.    She continues to smoke but has cut back.  The patient has been educated about the nature of the problem(s) and counseled on treatment options.  The patient appeared to understand what I have discussed and is in agreement with it.  Encounter Diagnoses  Name Primary?  . Chronic pain of right knee Yes  . Essential hypertension, benign   . Nicotine abuse     PLAN Call if any problems.  Precautions discussed.  Continue current medications.   Return to clinic 3 months   I have reviewed the [place] web site prior to prescribing narcotic medicine for this patient.  Electronically Signed Sanjuana Kava, MD 2/7/20182:12 PM

## 2017-01-27 NOTE — Progress Notes (Signed)
Cardiac Individual Treatment Plan  Patient Details  Name: Christine Harvey MRN: [id number] Date of Birth: [birth year] Referring Provider:   Flowsheet Row CARDIAC REHAB PHASE II ORIENTATION from 09/21/2016 in [place]  Referring Provider  Dr. Gwenlyn Found      Initial Encounter Date:  Flowsheet Row CARDIAC REHAB PHASE II ORIENTATION from 09/21/2016 in [place]  Date  09/21/16  Referring Provider  Dr. Gwenlyn Found      Visit Diagnosis: ST elevation myocardial infarction (STEMI), unspecified artery ([place])  Patient's Home Medications on Admission:  Current Outpatient Prescriptions:  .  acyclovir (ZOVIRAX) 200 MG capsule, Take 200 mg by mouth as needed (for cold sores). , Disp: , Rfl:  .  aspirin EC 81 MG EC tablet, Take 1 tablet (81 mg total) by mouth daily., Disp: , Rfl:  .  atorvastatin (LIPITOR) 80 MG tablet, Take 1 tablet (80 mg total) by mouth daily at 6 PM., Disp: 30 tablet, Rfl: 12 .  Blood Glucose Monitoring Suppl (ACCU-CHEK AVIVA) device, Use as instructed, Disp: 1 each, Rfl: 0 .  calcipotriene-betamethasone (TACLONEX SCALP) external suspension, Apply topically., Disp: , Rfl:  .  carvedilol (COREG) 25 MG tablet, TAKE 1 TABLET BY MOUTH TWICE DAILY WITH A MEAL., Disp: , Rfl: 12 .  clobetasol (TEMOVATE) 0.05 % external solution, Apply to affected areas on scalp once daily. Never to face., Disp: , Rfl:  .  Clobetasol Propionate (CLOBEX) 0.05 % shampoo, APPLY TO SCALP ONCE DAILY IN SHOWER, THEN RINSE., Disp: , Rfl:  .  clopidogrel (PLAVIX) 75 MG tablet, Take 1 tablet (75 mg total) by mouth daily with breakfast., Disp: 30 tablet, Rfl: 12 .  FLUOCINOLONE ACETONIDE SCALP 0.01 % OIL, APPLY TO SCALP EVERY DAY AS DIRECTED, Disp: , Rfl:  .  glucose blood (ACCU-CHEK AVIVA) test strip, Test glucose 4 times a day E11.65, Disp: 150 each, Rfl: 3 .  HYDROcodone-acetaminophen (NORCO/VICODIN) 5-325 MG tablet, One tablet by mouth every six hours as needed for pain.   Seven day limit per Medicaid guidelines., Disp: 28 tablet, Rfl: 0 .  Insulin Glargine (LANTUS SOLOSTAR) 100 UNIT/ML Solostar Pen, Inject 50 Units into the skin at bedtime., Disp: , Rfl:  .  isosorbide mononitrate (IMDUR) 30 MG 24 hr tablet, Take 1 tablet (30 mg total) by mouth daily., Disp: 90 tablet, Rfl: 3 .  Lancet Devices (ACCU-CHEK SOFTCLIX) lancets, Use as instructed, Disp: 1 each, Rfl: 0 .  Lancets MISC, Test 4 x daily. E11.65. Accu-Chek Aviva, Disp: 200 each, Rfl: 5 .  losartan (COZAAR) 100 MG tablet, Take 1 tablet (100 mg total) by mouth daily., Disp: 30 tablet, Rfl: 12 .  metFORMIN (GLUCOPHAGE) 500 MG tablet, TAKE 1 TABLET BY MOUTH TWICE DAILY WITH A MEAL, Disp: 60 tablet, Rfl: 2 .  nitroGLYCERIN (NITROSTAT) 0.4 MG SL tablet, Place 1 tablet (0.4 mg total) under the tongue every 5 (five) minutes x 3 doses as needed for chest pain., Disp: 25 tablet, Rfl: 2  Past Medical History: Past Medical History:  Diagnosis Date  . 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  some iInvolvement long the Clival bone and Effect upon the Pons - s/p sterotactic radiosurgery in 2014.  [place] CAD (coronary artery disease)    80-99% first diagonal (unable to be intervened upon), 60% circumflex, 75% RCA August 2017  . Depression   . Essential hypertension   . Frequent headaches   .  Hyperlipidemia   . Morbid obesity ([place])   . Osteoarthritis   . Photophobia    With Headaches  . S/P radiation therapy 05/02/13   25 Gy at 5 Gy per fraction, last treatment on 04/30/13  . ST elevation myocardial infarction (STEMI) of inferolateral wall [facility])    August 2017  . Type 2 diabetes mellitus (HCC)     Tobacco Use: History  Smoking Status  . Light Tobacco Smoker  . Types: Cigarettes  Smokeless Tobacco  . Never Used    Labs: Recent Review Flowsheet Data    Labs for ITP Cardiac and Pulmonary Rehab Latest Ref Rng & Units 01/27/2016 05/14/2016 07/31/2016 08/01/2016 11/24/2016    Cholestrol 0 - 200 mg/dL - - 116 117 90   LDLCALC 0 - 99 mg/dL - - 60 64 30   HDL >40 mg/dL - - 28(L) 33(L) 33(L)   Trlycerides <150 mg/dL - - 141 101 135   Hemoglobin A1c 4.8 - 5.6 % 10.4 11.4 9.0(H) - 9.4(H)   TCO2 0 - 100 mmol/L - - 25 - -      Capillary Blood Glucose: Lab Results  Component Value Date   GLUCAP 172 (H) 01/17/2017   GLUCAP 509 (HH) 12/31/2016   GLUCAP 154 (H) 08/03/2016   GLUCAP 145 (H) 08/03/2016   GLUCAP 176 (H) 08/02/2016     Exercise Target Goals:    Exercise Program Goal: Individual exercise prescription set with THRR, safety & activity barriers. Participant demonstrates ability to understand and report RPE using BORG scale, to self-measure pulse accurately, and to acknowledge the importance of the exercise prescription.  Exercise Prescription Goal: Starting with aerobic activity 30 plus minutes a day, 3 days per week for initial exercise prescription. Provide home exercise prescription and guidelines that participant acknowledges understanding prior to discharge.  Activity Barriers & Risk Stratification:     Activity Barriers & Cardiac Risk Stratification - 09/21/16 1622      Activity Barriers & Cardiac Risk Stratification   Activity Barriers None   Cardiac Risk Stratification High      6 Minute Walk:     6 Minute Walk    Row Name 09/21/16 1508         6 Minute Walk   Phase Initial     Distance 700 feet     Walk Time 4.5 minutes     # of Rest Breaks 0     MPH 1.32     METS 2.01     RPE 11     Perceived Dyspnea  13     VO2 Peak 6.55     Symptoms Yes (comment)     Comments Patient had to stop at four miniutes and thirty seconds due to chest pressure 2/10. This pressure subsided after a two minute rest break      Resting HR 62 bpm     Resting BP 142/62     Max Ex. HR 80 bpm     Max Ex. BP 154/64     2 Minute Post BP 138/66        Initial Exercise Prescription:     Initial Exercise Prescription - 09/21/16 1500      Date  of Initial Exercise RX and Referring Provider   Date 09/21/16   Referring Provider Dr. Julius Bowels   Level 2   Watts 15   Minutes 20   METs 1.9     Arm Ergometer   Level 2  Watts 15   Minutes 15   METs 1.9     Prescription Details   Frequency (times per week) 3   Duration Progress to 30 minutes of continuous aerobic without signs/symptoms of physical distress     Intensity   THRR REST +  30   THRR 40-80% of Max Heartrate 101-121-140   Ratings of Perceived Exertion 11-13   Perceived Dyspnea 0-4     Progression   Progression Continue progressive overload as per policy without signs/symptoms or physical distress.     Resistance Training   Training Prescription Yes   Weight 1   Reps 10-12      Perform Capillary Blood Glucose checks as needed.  Exercise Prescription Changes:      Exercise Prescription Changes    Row Name 09/29/16 1400 10/29/16 1200 11/25/16 1400 12/27/16 0800 01/25/17 1200     Exercise Review   Progression Yes Yes Yes Yes Yes     Response to Exercise   Blood Pressure (Admit) 110/64 136/70 110/70 132/72 132/70   Blood Pressure (Exercise) 138/74 118/52 110/72 140/72 148/72   Blood Pressure (Exit) 106/60 152/88 114/68 130/80 138/74   Heart Rate (Admit) 66 bpm 77 bpm 73 bpm 65 bpm 62 bpm   Heart Rate (Exercise) 74 bpm 64 bpm 73 bpm 75 bpm 75 bpm   Heart Rate (Exit) 72 bpm 47 bpm 80 bpm 74 bpm 71 bpm   Rating of Perceived Exertion (Exercise) 11 9 9 10 11    Duration Progress to 30 minutes of continuous aerobic without signs/symptoms of physical distress Progress to 30 minutes of continuous aerobic without signs/symptoms of physical distress Progress to 30 minutes of continuous aerobic without signs/symptoms of physical distress Progress to 30 minutes of continuous aerobic without signs/symptoms of physical distress Progress to 30 minutes of continuous aerobic without signs/symptoms of physical distress   Intensity Rest + 30 Rest + 30 Rest + 30 Rest  + 30 Rest + 30     Progression   Progression Continue progressive overload as per policy without signs/symptoms or physical distress. Continue progressive overload as per policy without signs/symptoms or physical distress. Continue progressive overload as per policy without signs/symptoms or physical distress. Continue progressive overload as per policy without signs/symptoms or physical distress. Continue progressive overload as per policy without signs/symptoms or physical distress.     Resistance Training   Training Prescription Yes Yes Yes Yes Yes   Weight 2 2 2 2 3    Reps 10-12 10-12 10-12 10-12 10-12     NuStep   Level 2 2 3 3 3    Watts 12 16 13 20 14    Minutes 20 20 20 20 20    METs 3.56 2.2 3.56 3.56 3.56     Arm Ergometer   Level 2.3 2.2 2.3 2.6 2.8   Watts 3 3 2 4 7    Minutes 15 15 15 15 15    METs 2.3 2 2.3 2.6 2.8     Home Exercise Plan   Plans to continue exercise at [place]   Frequency Add 2 additional days to program exercise sessions. Add 2 additional days to program exercise sessions. Add 2 additional days to program exercise sessions. Add 2 additional days to program exercise sessions. Add 2 additional days to program exercise sessions.      Exercise Comments:      Exercise Comments    Row Name 09/29/16 1450 10/29/16 1245 11/25/16 1440 12/27/16 0832 01/25/17 1242   Exercise  Comments Patient is proggressing well Patient is progressing but has not been attending regularly  Patient is progressing appropriately  Patient is proggressing appropriately although her attendance is lacking Patient is progressing well       Discharge Exercise Prescription (Final Exercise Prescription Changes):     Exercise Prescription Changes - 01/25/17 1200      Exercise Review   Progression Yes     Response to Exercise   Blood Pressure (Admit) 132/70   Blood Pressure (Exercise) 148/72   Blood Pressure (Exit) 138/74   Heart Rate (Admit) 62 bpm   Heart Rate  (Exercise) 75 bpm   Heart Rate (Exit) 71 bpm   Rating of Perceived Exertion (Exercise) 11   Duration Progress to 30 minutes of continuous aerobic without signs/symptoms of physical distress   Intensity Rest + 30     Progression   Progression Continue progressive overload as per policy without signs/symptoms or physical distress.     Resistance Training   Training Prescription Yes   Weight 3   Reps 10-12     NuStep   Level 3   Watts 14   Minutes 20   METs 3.56     Arm Ergometer   Level 2.8   Watts 7   Minutes 15   METs 2.8     Home Exercise Plan   Plans to continue exercise at Home   Frequency Add 2 additional days to program exercise sessions.      Nutrition:  Target Goals: Understanding of nutrition guidelines, daily intake of sodium 1500mg , cholesterol 200mg , calories 30% from fat and 7% or less from saturated fats, daily to have 5 or more servings of fruits and vegetables.  Biometrics:     Pre Biometrics - 09/21/16 1511      Pre Biometrics   Height 5\' 4"  (1.626 m)   Weight 183 lb 3.2 oz (83.1 kg)   Waist Circumference 38 inches   Hip Circumference 41.5 inches   Waist to Hip Ratio 0.92 %   BMI (Calculated) 31.5   Triceps Skinfold 15 mm   % Body Fat 38.1 %   Grip Strength 55.7 kg   Flexibility 0 in   Single Leg Stand 10 seconds       Nutrition Therapy Plan and Nutrition Goals:   Nutrition Discharge: Rate Your Plate Scores:     Nutrition Assessments - 09/21/16 1630      MEDFICTS Scores   Pre Score 21      Nutrition Goals Re-Evaluation:   Psychosocial: Target Goals: Acknowledge presence or absence of depression, maximize coping skills, provide positive support system. Participant is able to verbalize types and ability to use techniques and skills needed for reducing stress and depression.  Initial Review & Psychosocial Screening:     Initial Psych Review & Screening - 09/21/16 1633      Initial Review   Current issues with --  Main  concern is transportation. she can ride [employer], they are sometimes not reliable.      Family Dynamics   Good Support System? Yes     Barriers   Psychosocial barriers to participate in program There are no identifiable barriers or psychosocial needs.     Screening Interventions   Interventions Encouraged to exercise      Quality of Life Scores:     Quality of Life - 09/21/16 1512      Quality of Life Scores   Health/Function Pre 22.15 %   Socioeconomic Pre 29 %  Psych/Spiritual Pre 30 %   Family Pre 30 %   GLOBAL Pre 26.14 %      PHQ-9: Recent Review Flowsheet Data    Depression screen [facility] 2/9 09/21/2016 05/20/2016 05/12/2016 03/09/2016 02/26/2016   Decreased Interest 0 0 0 0 0   Down, Depressed, Hopeless 0 0 0 0 0   PHQ - 2 Score 0 0 0 0 0   Altered sleeping 0 - - - -   Tired, decreased energy 0 - - - -   Change in appetite 0 - - - -   Feeling bad or failure about yourself  0 - - - -   Trouble concentrating 0 - - - -   Moving slowly or fidgety/restless 0 - - - -   Suicidal thoughts 0 - - - -   PHQ-9 Score 0 - - - -      Psychosocial Evaluation and Intervention:     Psychosocial Evaluation - 09/21/16 1634      Psychosocial Evaluation & Interventions   Interventions Encouraged to exercise with the program and follow exercise prescription   Continued Psychosocial Services Needed No      Psychosocial Re-Evaluation:     Psychosocial Re-Evaluation    Row Name 09/30/16 1203 11/01/16 1427 12/01/16 1255 12/30/16 1459 01/27/17 1552     Psychosocial Re-Evaluation   Interventions Encouraged to attend Cardiac Rehabilitation for the exercise Encouraged to attend Cardiac Rehabilitation for the exercise Encouraged to attend Cardiac Rehabilitation for the exercise Encouraged to attend Cardiac Rehabilitation for the exercise Encouraged to attend Cardiac Rehabilitation for the exercise   Comments Patient's QOL score was 26.14 and her PHQ-9 score was 0. She has no psychosocial  issues.  Patient's QOL score was 26.14 and her PHQ-9 score was 0. She continues to have no psychosocial issues. Patient continues to have no psychosocial issues identified.  Patient continues to have no psychosocial issues identified.  Patient continues to have no psychosocial issues identified.    Continued Psychosocial Services Needed No No No No No      Vocational Rehabilitation: Provide vocational rehab assistance to qualifying candidates.   Vocational Rehab Evaluation & Intervention:     Vocational Rehab - 09/21/16 1626      Initial Vocational Rehab Evaluation & Intervention   Assessment shows need for Vocational Rehabilitation No      Education: Education Goals: Education classes will be provided on a weekly basis, covering required topics. Participant will state understanding/return demonstration of topics presented.  Learning Barriers/Preferences:     Learning Barriers/Preferences - 09/21/16 1626      Learning Barriers/Preferences   Learning Barriers None   Learning Preferences Video;Pictoral      Education Topics: Hypertension, Hypertension Reduction -Define heart disease and high blood pressure. Discus how high blood pressure affects the body and ways to reduce high blood pressure. Flowsheet Row CARDIAC REHAB PHASE II EXERCISE from 01/12/2017 in [place]  Date  (P) 10/20/16  Educator  (P) DC  Instruction Review Code  (P) 2- meets goals/outcomes      Exercise and Your Heart -Discuss why it is important to exercise, the FITT principles of exercise, normal and abnormal responses to exercise, and how to exercise safely.   Angina -Discuss definition of angina, causes of angina, treatment of angina, and how to decrease risk of having angina. Flowsheet Row CARDIAC REHAB PHASE II EXERCISE from 01/12/2017 in [place]  Date  (P) 11/03/16  Educator  (P) Russella Dar  Instruction Review Code  (P) 2- meets goals/outcomes       Cardiac Medications -Review what the following cardiac medications are used for, how they affect the body, and side effects that may occur when taking the medications.  Medications include Aspirin, Beta blockers, calcium channel blockers, ACE Inhibitors, angiotensin receptor blockers, diuretics, digoxin, and antihyperlipidemics.   Congestive Heart Failure -Discuss the definition of CHF, how to live with CHF, the signs and symptoms of CHF, and how keep track of weight and sodium intake. Flowsheet Row CARDIAC REHAB PHASE II EXERCISE from 01/12/2017 in [place]  Date  (P) 11/17/16  Educator  (P) Suzanne Boron  Instruction Review Code  (P) 2- meets goals/outcomes      Heart Disease and Intimacy -Discus the effect sexual activity has on the heart, how changes occur during intimacy as we age, and safety during sexual activity.   Smoking Cessation / COPD -Discuss different methods to quit smoking, the health benefits of quitting smoking, and the definition of COPD. Flowsheet Row CARDIAC REHAB PHASE II EXERCISE from 01/12/2017 in [place]  Date  (P) 12/01/16  Educator  (P) Russella Dar  Instruction Review Code  (P) 2- meets goals/outcomes      Nutrition I: Fats -Discuss the types of cholesterol, what cholesterol does to the heart, and how cholesterol levels can be controlled. Flowsheet Row CARDIAC REHAB PHASE II EXERCISE from 01/12/2017 in [place]  Date  (P) 12/08/16  Educator  (P) Russella Dar  Instruction Review Code  (P) 2- meets goals/outcomes      Nutrition II: Labels -Discuss the different components of food labels and how to read food label   Heart Parts and Heart Disease -Discuss the anatomy of the heart, the pathway of blood circulation through the heart, and these are affected by heart disease.   Stress I: Signs and Symptoms -Discuss the causes of stress, how stress may lead to anxiety and  depression, and ways to limit stress. Flowsheet Row CARDIAC REHAB PHASE II EXERCISE from 01/12/2017 in [place]  Date  (P) 09/29/16  Educator  (P) Russella Dar  Instruction Review Code  (P) 2- meets goals/outcomes      Stress II: Relaxation -Discuss different types of relaxation techniques to limit stress. Flowsheet Row CARDIAC REHAB PHASE II EXERCISE from 01/12/2017 in [place]  Date  (P) 10/06/16  Educator  (P) Russella Dar  Instruction Review Code  (P) 2- meets goals/outcomes      Warning Signs of Stroke / TIA -Discuss definition of a stroke, what the signs and symptoms are of a stroke, and how to identify when someone is having stroke. Flowsheet Row CARDIAC REHAB PHASE II EXERCISE from 01/12/2017 in [place]  Date  (P) 10/13/16  Educator  (P) Russella Dar  Instruction Review Code  (P) 2- meets goals/outcomes      Knowledge Questionnaire Score:     Knowledge Questionnaire Score - 09/21/16 1626      Knowledge Questionnaire Score   Pre Score --  Patient did not finish pre test.       Core Components/Risk Factors/Patient Goals at Admission:     Personal Goals and Risk Factors at Admission - 09/21/16 1630      Core Components/Risk Factors/Patient Goals on Admission    Weight Management Weight Maintenance   Sedentary Yes   Intervention Provide advice, education, support and counseling about physical activity/exercise needs.;Develop an individualized exercise  prescription for aerobic and resistive training based on initial evaluation findings, risk stratification, comorbidities and participant's personal goals.   Expected Outcomes Achievement of increased cardiorespiratory fitness and enhanced flexibility, muscular endurance and strength shown through measurements of functional capacity and personal statement of participant.   Increase Strength and Stamina Yes   Intervention Provide advice, education,  support and counseling about physical activity/exercise needs.;Develop an individualized exercise prescription for aerobic and resistive training based on initial evaluation findings, risk stratification, comorbidities and participant's personal goals.   Expected Outcomes Achievement of increased cardiorespiratory fitness and enhanced flexibility, muscular endurance and strength shown through measurements of functional capacity and personal statement of participant.   Tobacco Cessation Yes   Intervention Advice worker, assist with locating and accessing local/national Quit Smoking programs, and support quit date choice.   Expected Outcomes Short Term: Will demonstrate readiness to quit, by selecting a quit date.   Diabetes Yes   Intervention Provide education about signs/symptoms and action to take for hypo/hyperglycemia.   Expected Outcomes Short Term: Participant verbalizes understanding of the signs/symptoms and immediate care of hyper/hypoglycemia, proper foot care and importance of medication, aerobic/resistive exercise and nutrition plan for blood glucose control.;Long Term: Attainment of HbA1C < 7%.   Personal Goal Breath better, walk more w/o SOB or chest discomfort.   Intervention attend CR 3x week and supplement exercise 2 x week at home.   Expected Outcomes Reach personal goals.       Core Components/Risk Factors/Patient Goals Review:      Goals and Risk Factor Review    Row Name 09/21/16 1633 09/30/16 1200 11/01/16 1418 12/01/16 1253 12/30/16 1457     Core Components/Risk Factors/Patient Goals Review   Personal Goals Review Increase Strength and Stamina;Sedentary;Tobacco Cessation;Diabetes Weight Management/Obesity;Increase Strength and Stamina;Sedentary;Tobacco Cessation;Other  Walk more w/o SOB.       Weight Management/Obesity;Increase Strength and Stamina;Tobacco Cessation;Sedentary;Other  Breathe better. Walk with decreased SOB. Weight  Management/Obesity;Increase Strength and Stamina;Tobacco Cessation;Improve shortness of breath with ADL's  Walk more without SOB. Weight Management/Obesity;Increase Strength and Stamina;Diabetes;Improve shortness of breath with ADL's  Breath better, walk more w/o SOB or chest discomfort.   Review  - Patient has attended 3 sessions. She has lost 1.7 lbs. She is doing well. Will continue to monitor for progress. Patient has attended 12 sessions. She is maintaining her weight. She is getting stronger and her SOB is improving. She continues to smoke.  Patient has attended 18 sessions losing 0.9 lbs. She continue to smoke but is still working toward cessation. Her strength and stamina are increasing with improved SOB. Patient has attended 21 sessions. Her attendance has been inconsistent. She has maintained her weight. She has progressed some with some improvement in her strength and stamina and SOB.    Expected Outcomes  - Patient will continue to attend sessions and meet her personal goals.  Patient will complete the program meeting her personal goals.  Patient will continue to attend sessions meeting her personal goals.  Patient will attend sessions consistently and complete the program meeting her personal goals.    Secor Name 01/27/17 1544             Core Components/Risk Factors/Patient Goals Review   Personal Goals Review Weight Management/Obesity;Sedentary;Increase Strength and Stamina;Tobacco Cessation;Improve shortness of breath with ADL's  Walk more w/o SOB.        Review Patient has attended 25 sessions. She has maintained her weight. Her attendance has been inconsistent. She has had some progression. Her  DM is uncontrolled. Her last visit was 01/17/17. At that visit she was sent to the ER for dizziness elevated b/p and "feeling funny". She has been cleared by her Cardiologist to return. She has not returned yet.        Expected Outcomes Patient will return and complete the program meeting her  personal goals.           Core Components/Risk Factors/Patient Goals at Discharge (Final Review):      Goals and Risk Factor Review - 01/27/17 1544      Core Components/Risk Factors/Patient Goals Review   Personal Goals Review Weight Management/Obesity;Sedentary;Increase Strength and Stamina;Tobacco Cessation;Improve shortness of breath with ADL's  Walk more w/o SOB.    Review Patient has attended 25 sessions. She has maintained her weight. Her attendance has been inconsistent. She has had some progression. Her DM is uncontrolled. Her last visit was 01/17/17. At that visit she was sent to the ER for dizziness elevated b/p and "feeling funny". She has been cleared by her Cardiologist to return. She has not returned yet.    Expected Outcomes Patient will return and complete the program meeting her personal goals.       ITP Comments:   Comments: ITP 30 Day REVIEW Patient doing well in the program. Will continue to monitor for progress.

## 2017-01-28 ENCOUNTER — Encounter (HOSPITAL_COMMUNITY): Payer: Medicaid Other

## 2017-01-31 ENCOUNTER — Encounter (HOSPITAL_COMMUNITY): Payer: Medicaid Other

## 2017-01-31 ENCOUNTER — Other Ambulatory Visit (HOSPITAL_COMMUNITY): Payer: Self-pay

## 2017-01-31 NOTE — Progress Notes (Signed)
Discharge Summary  Patient Details  Name: Christine Harvey MRN: [id number] Date of Birth: Feb 01, 1956 Referring Provider:   Flowsheet Row CARDIAC REHAB PHASE II ORIENTATION from 09/21/2016 in [place]  Referring Provider  Dr. Gwenlyn Found       Number of Visits: 25  Reason for Discharge:  Early Exit:  Patient stopped coming after 25 visits. Her attendance was inconsistent through-out the program. Her DM remains uncontrolled. The last session patient attended, she was sent to the ER d/t feeling dizzy and feeling "funny" in her chest. She never returned after that session. She said she wanted to be dropped from the program.   Smoking History:  History  Smoking Status  . Light Tobacco Smoker  . Types: Cigarettes  Smokeless Tobacco  . Never Used    Diagnosis:  ST elevation myocardial infarction (STEMI), unspecified artery ([place])  ADL [facility]:   Initial Exercise Prescription:     Initial Exercise Prescription - 09/21/16 1500      Date of Initial Exercise RX and Referring Provider   Date 09/21/16   Referring Provider Dr. Julius Bowels   Level 2   Watts 15   Minutes 20   METs 1.9     Arm Ergometer   Level 2   Watts 15   Minutes 15   METs 1.9     Prescription Details   Frequency (times per week) 3   Duration Progress to 30 minutes of continuous aerobic without signs/symptoms of physical distress     Intensity   THRR REST +  30   THRR 40-80% of Max Heartrate 101-121-140   Ratings of Perceived Exertion 11-13   Perceived Dyspnea 0-4     Progression   Progression Continue progressive overload as per policy without signs/symptoms or physical distress.     Resistance Training   Training Prescription Yes   Weight 1   Reps 10-12      Discharge Exercise Prescription (Final Exercise Prescription Changes):     Exercise Prescription Changes - 01/25/17 1200      Exercise Review   Progression Yes     Response to Exercise   Blood Pressure  (Admit) 132/70   Blood Pressure (Exercise) 148/72   Blood Pressure (Exit) 138/74   Heart Rate (Admit) 62 bpm   Heart Rate (Exercise) 75 bpm   Heart Rate (Exit) 71 bpm   Rating of Perceived Exertion (Exercise) 11   Duration Progress to 30 minutes of continuous aerobic without signs/symptoms of physical distress   Intensity Rest + 30     Progression   Progression Continue progressive overload as per policy without signs/symptoms or physical distress.     Resistance Training   Training Prescription Yes   Weight 3   Reps 10-12     NuStep   Level 3   Watts 14   Minutes 20   METs 3.56     Arm Ergometer   Level 2.8   Watts 7   Minutes 15   METs 2.8     Home Exercise Plan   Plans to continue exercise at Home   Frequency Add 2 additional days to program exercise sessions.      Functional Capacity:     6 Minute Walk    Row Name 09/21/16 1508         6 Minute Walk   Phase Initial     Distance 700 feet     Walk Time 4.5 minutes     #  of Rest Breaks 0     MPH 1.32     METS 2.01     RPE 11     Perceived Dyspnea  13     VO2 Peak 6.55     Symptoms Yes (comment)     Comments Patient had to stop at four miniutes and thirty seconds due to chest pressure 2/10. This pressure subsided after a two minute rest break      Resting HR 62 bpm     Resting BP 142/62     Max Ex. HR 80 bpm     Max Ex. BP 154/64     2 Minute Post BP 138/66        Psychological, QOL, Others - Outcomes: PHQ 2/9: Depression screen [facility] 2/9 09/21/2016 05/20/2016 05/12/2016 03/09/2016 02/26/2016  Decreased Interest 0 0 0 0 0  Down, Depressed, Hopeless 0 0 0 0 0  PHQ - 2 Score 0 0 0 0 0  Altered sleeping 0 - - - -  Tired, decreased energy 0 - - - -  Change in appetite 0 - - - -  Feeling bad or failure about yourself  0 - - - -  Trouble concentrating 0 - - - -  Moving slowly or fidgety/restless 0 - - - -  Suicidal thoughts 0 - - - -  PHQ-9 Score 0 - - - -    Quality of Life:     Quality of Life -  09/21/16 1512      Quality of Life Scores   Health/Function Pre 22.15 %   Socioeconomic Pre 29 %   Psych/Spiritual Pre 30 %   Family Pre 30 %   GLOBAL Pre 26.14 %      Personal Goals: Goals established at orientation with interventions provided to work toward goal.     Personal Goals and Risk Factors at Admission - 09/21/16 1630      Core Components/Risk Factors/Patient Goals on Admission    Weight Management Weight Maintenance   Sedentary Yes   Intervention Provide advice, education, support and counseling about physical activity/exercise needs.;Develop an individualized exercise prescription for aerobic and resistive training based on initial evaluation findings, risk stratification, comorbidities and participant's personal goals.   Expected Outcomes Achievement of increased cardiorespiratory fitness and enhanced flexibility, muscular endurance and strength shown through measurements of functional capacity and personal statement of participant.   Increase Strength and Stamina Yes   Intervention Provide advice, education, support and counseling about physical activity/exercise needs.;Develop an individualized exercise prescription for aerobic and resistive training based on initial evaluation findings, risk stratification, comorbidities and participant's personal goals.   Expected Outcomes Achievement of increased cardiorespiratory fitness and enhanced flexibility, muscular endurance and strength shown through measurements of functional capacity and personal statement of participant.   Tobacco Cessation Yes   Intervention Advice worker, assist with locating and accessing local/national Quit Smoking programs, and support quit date choice.   Expected Outcomes Short Term: Will demonstrate readiness to quit, by selecting a quit date.   Diabetes Yes   Intervention Provide education about signs/symptoms and action to take for hypo/hyperglycemia.   Expected Outcomes Short  Term: Participant verbalizes understanding of the signs/symptoms and immediate care of hyper/hypoglycemia, proper foot care and importance of medication, aerobic/resistive exercise and nutrition plan for blood glucose control.;Long Term: Attainment of HbA1C < 7%.   Personal Goal Breath better, walk more w/o SOB or chest discomfort.   Intervention attend CR 3x week and supplement exercise 2 x  week at home.   Expected Outcomes Reach personal goals.        Personal Goals Discharge:     Goals and Risk Factor Review    Row Name 09/21/16 1633 09/30/16 1200 11/01/16 1418 12/01/16 1253 12/30/16 1457     Core Components/Risk Factors/Patient Goals Review   Personal Goals Review Increase Strength and Stamina;Sedentary;Tobacco Cessation;Diabetes Weight Management/Obesity;Increase Strength and Stamina;Sedentary;Tobacco Cessation;Other  Walk more w/o SOB.       Weight Management/Obesity;Increase Strength and Stamina;Tobacco Cessation;Sedentary;Other  Breathe better. Walk with decreased SOB. Weight Management/Obesity;Increase Strength and Stamina;Tobacco Cessation;Improve shortness of breath with ADL's  Walk more without SOB. Weight Management/Obesity;Increase Strength and Stamina;Diabetes;Improve shortness of breath with ADL's  Breath better, walk more w/o SOB or chest discomfort.   Review  - Patient has attended 3 sessions. She has lost 1.7 lbs. She is doing well. Will continue to monitor for progress. Patient has attended 12 sessions. She is maintaining her weight. She is getting stronger and her SOB is improving. She continues to smoke.  Patient has attended 18 sessions losing 0.9 lbs. She continue to smoke but is still working toward cessation. Her strength and stamina are increasing with improved SOB. Patient has attended 21 sessions. Her attendance has been inconsistent. She has maintained her weight. She has progressed some with some improvement in her strength and stamina and SOB.    Expected Outcomes   - Patient will continue to attend sessions and meet her personal goals.  Patient will complete the program meeting her personal goals.  Patient will continue to attend sessions meeting her personal goals.  Patient will attend sessions consistently and complete the program meeting her personal goals.    [place] Name 01/27/17 1544             Core Components/Risk Factors/Patient Goals Review   Personal Goals Review Weight Management/Obesity;Sedentary;Increase Strength and Stamina;Tobacco Cessation;Improve shortness of breath with ADL's  Walk more w/o SOB.        Review Patient has attended 25 sessions. She has maintained her weight. Her attendance has been inconsistent. She has had some progression. Her DM is uncontrolled. Her last visit was 01/17/17. At that visit she was sent to the ER for dizziness elevated b/p and "feeling funny". She has been cleared by her Cardiologist to return. She has not returned yet.        Expected Outcomes Patient will return and complete the program meeting her personal goals.           Nutrition & Weight - Outcomes:     Pre Biometrics - 09/21/16 1511      Pre Biometrics   Height 5\' 4"  (1.626 m)   Weight 183 lb 3.2 oz (83.1 kg)   Waist Circumference 38 inches   Hip Circumference 41.5 inches   Waist to Hip Ratio 0.92 %   BMI (Calculated) 31.5   Triceps Skinfold 15 mm   % Body Fat 38.1 %   Grip Strength 55.7 kg   Flexibility 0 in   Single Leg Stand 10 seconds       Nutrition:   Nutrition Discharge:     Nutrition Assessments - 09/21/16 1630      MEDFICTS Scores   Pre Score 21      Education Questionnaire Score:     Knowledge Questionnaire Score - 09/21/16 1626      Knowledge Questionnaire Score   Pre Score --  Patient did not finish pre test.

## 2017-01-31 NOTE — Progress Notes (Signed)
Cardiac Individual Treatment Plan  Patient Details  Name: Christine Harvey MRN: [id number] Date of Birth: 1956/06/11 Referring Provider:   Flowsheet Row CARDIAC REHAB PHASE II ORIENTATION from 09/21/2016 in [place]  Referring Provider  Dr. Gwenlyn Found      Initial Encounter Date:  Flowsheet Row CARDIAC REHAB PHASE II ORIENTATION from 09/21/2016 in [place]  Date  09/21/16  Referring Provider  Dr. Gwenlyn Found      Visit Diagnosis: ST elevation myocardial infarction (STEMI), unspecified artery ([place])  Patient's Home Medications on Admission:  Current Outpatient Prescriptions:  .  acyclovir (ZOVIRAX) 200 MG capsule, Take 200 mg by mouth as needed (for cold sores). , Disp: , Rfl:  .  aspirin EC 81 MG EC tablet, Take 1 tablet (81 mg total) by mouth daily., Disp: , Rfl:  .  atorvastatin (LIPITOR) 80 MG tablet, Take 1 tablet (80 mg total) by mouth daily at 6 PM., Disp: 30 tablet, Rfl: 12 .  Blood Glucose Monitoring Suppl (ACCU-CHEK AVIVA) device, Use as instructed, Disp: 1 each, Rfl: 0 .  calcipotriene-betamethasone (TACLONEX SCALP) external suspension, Apply topically., Disp: , Rfl:  .  carvedilol (COREG) 25 MG tablet, TAKE 1 TABLET BY MOUTH TWICE DAILY WITH A MEAL., Disp: , Rfl: 12 .  clobetasol (TEMOVATE) 0.05 % external solution, Apply to affected areas on scalp once daily. Never to face., Disp: , Rfl:  .  Clobetasol Propionate (CLOBEX) 0.05 % shampoo, APPLY TO SCALP ONCE DAILY IN SHOWER, THEN RINSE., Disp: , Rfl:  .  clopidogrel (PLAVIX) 75 MG tablet, Take 1 tablet (75 mg total) by mouth daily with breakfast., Disp: 30 tablet, Rfl: 12 .  FLUOCINOLONE ACETONIDE SCALP 0.01 % OIL, APPLY TO SCALP EVERY DAY AS DIRECTED, Disp: , Rfl:  .  glucose blood (ACCU-CHEK AVIVA) test strip, Test glucose 4 times a day E11.65, Disp: 150 each, Rfl: 3 .  HYDROcodone-acetaminophen (NORCO/VICODIN) 5-325 MG tablet, One tablet by mouth every six hours as needed for pain.   Seven day limit per Medicaid guidelines., Disp: 28 tablet, Rfl: 0 .  Insulin Glargine (LANTUS SOLOSTAR) 100 UNIT/ML Solostar Pen, Inject 50 Units into the skin at bedtime., Disp: , Rfl:  .  isosorbide mononitrate (IMDUR) 30 MG 24 hr tablet, Take 1 tablet (30 mg total) by mouth daily., Disp: 90 tablet, Rfl: 3 .  Lancet Devices (ACCU-CHEK SOFTCLIX) lancets, Use as instructed, Disp: 1 each, Rfl: 0 .  Lancets MISC, Test 4 x daily. E11.65. Accu-Chek Aviva, Disp: 200 each, Rfl: 5 .  losartan (COZAAR) 100 MG tablet, Take 1 tablet (100 mg total) by mouth daily., Disp: 30 tablet, Rfl: 12 .  metFORMIN (GLUCOPHAGE) 500 MG tablet, TAKE 1 TABLET BY MOUTH TWICE DAILY WITH A MEAL, Disp: 60 tablet, Rfl: 2 .  nitroGLYCERIN (NITROSTAT) 0.4 MG SL tablet, Place 1 tablet (0.4 mg total) under the tongue every 5 (five) minutes x 3 doses as needed for chest pain., Disp: 25 tablet, Rfl: 2  Past Medical History: Past Medical History:  Diagnosis Date  . 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  some iInvolvement long the Clival bone and Effect upon the Pons - s/p sterotactic radiosurgery in 2014.  [place] CAD (coronary artery disease)    80-99% first diagonal (unable to be intervened upon), 60% circumflex, 75% RCA August 2017  . Depression   . Essential hypertension   . Frequent headaches   .  Hyperlipidemia   . Morbid obesity ([place])   . Osteoarthritis   . Photophobia    With Headaches  . S/P radiation therapy 05/02/13   25 Gy at 5 Gy per fraction, last treatment on 04/30/13  . ST elevation myocardial infarction (STEMI) of inferolateral wall [facility])    August 2017  . Type 2 diabetes mellitus (HCC)     Tobacco Use: History  Smoking Status  . Light Tobacco Smoker  . Types: Cigarettes  Smokeless Tobacco  . Never Used    Labs: Recent Review Flowsheet Data    Labs for ITP Cardiac and Pulmonary Rehab Latest Ref Rng & Units 01/27/2016 05/14/2016 07/31/2016 08/01/2016 11/24/2016    Cholestrol 0 - 200 mg/dL - - 116 117 90   LDLCALC 0 - 99 mg/dL - - 60 64 30   HDL >40 mg/dL - - 28(L) 33(L) 33(L)   Trlycerides <150 mg/dL - - 141 101 135   Hemoglobin A1c 4.8 - 5.6 % 10.4 11.4 9.0(H) - 9.4(H)   TCO2 0 - 100 mmol/L - - 25 - -      Capillary Blood Glucose: Lab Results  Component Value Date   GLUCAP 172 (H) 01/17/2017   GLUCAP 509 (HH) 12/31/2016   GLUCAP 154 (H) 08/03/2016   GLUCAP 145 (H) 08/03/2016   GLUCAP 176 (H) 08/02/2016     Exercise Target Goals:    Exercise Program Goal: Individual exercise prescription set with THRR, safety & activity barriers. Participant demonstrates ability to understand and report RPE using BORG scale, to self-measure pulse accurately, and to acknowledge the importance of the exercise prescription.  Exercise Prescription Goal: Starting with aerobic activity 30 plus minutes a day, 3 days per week for initial exercise prescription. Provide home exercise prescription and guidelines that participant acknowledges understanding prior to discharge.  Activity Barriers & Risk Stratification:     Activity Barriers & Cardiac Risk Stratification - 09/21/16 1622      Activity Barriers & Cardiac Risk Stratification   Activity Barriers None   Cardiac Risk Stratification High      6 Minute Walk:     6 Minute Walk    Row Name 09/21/16 1508         6 Minute Walk   Phase Initial     Distance 700 feet     Walk Time 4.5 minutes     # of Rest Breaks 0     MPH 1.32     METS 2.01     RPE 11     Perceived Dyspnea  13     VO2 Peak 6.55     Symptoms Yes (comment)     Comments Patient had to stop at four miniutes and thirty seconds due to chest pressure 2/10. This pressure subsided after a two minute rest break      Resting HR 62 bpm     Resting BP 142/62     Max Ex. HR 80 bpm     Max Ex. BP 154/64     2 Minute Post BP 138/66        Initial Exercise Prescription:     Initial Exercise Prescription - 09/21/16 1500      Date  of Initial Exercise RX and Referring Provider   Date 09/21/16   Referring Provider Dr. Julius Bowels   Level 2   Watts 15   Minutes 20   METs 1.9     Arm Ergometer   Level 2  Watts 15   Minutes 15   METs 1.9     Prescription Details   Frequency (times per week) 3   Duration Progress to 30 minutes of continuous aerobic without signs/symptoms of physical distress     Intensity   THRR REST +  30   THRR 40-80% of Max Heartrate 101-121-140   Ratings of Perceived Exertion 11-13   Perceived Dyspnea 0-4     Progression   Progression Continue progressive overload as per policy without signs/symptoms or physical distress.     Resistance Training   Training Prescription Yes   Weight 1   Reps 10-12      Perform Capillary Blood Glucose checks as needed.  Exercise Prescription Changes:      Exercise Prescription Changes    Row Name 09/29/16 1400 10/29/16 1200 11/25/16 1400 12/27/16 0800 01/25/17 1200     Exercise Review   Progression Yes Yes Yes Yes Yes     Response to Exercise   Blood Pressure (Admit) 110/64 136/70 110/70 132/72 132/70   Blood Pressure (Exercise) 138/74 118/52 110/72 140/72 148/72   Blood Pressure (Exit) 106/60 152/88 114/68 130/80 138/74   Heart Rate (Admit) 66 bpm 77 bpm 73 bpm 65 bpm 62 bpm   Heart Rate (Exercise) 74 bpm 64 bpm 73 bpm 75 bpm 75 bpm   Heart Rate (Exit) 72 bpm 47 bpm 80 bpm 74 bpm 71 bpm   Rating of Perceived Exertion (Exercise) 11 9 9 10 11    Duration Progress to 30 minutes of continuous aerobic without signs/symptoms of physical distress Progress to 30 minutes of continuous aerobic without signs/symptoms of physical distress Progress to 30 minutes of continuous aerobic without signs/symptoms of physical distress Progress to 30 minutes of continuous aerobic without signs/symptoms of physical distress Progress to 30 minutes of continuous aerobic without signs/symptoms of physical distress   Intensity Rest + 30 Rest + 30 Rest + 30 Rest  + 30 Rest + 30     Progression   Progression Continue progressive overload as per policy without signs/symptoms or physical distress. Continue progressive overload as per policy without signs/symptoms or physical distress. Continue progressive overload as per policy without signs/symptoms or physical distress. Continue progressive overload as per policy without signs/symptoms or physical distress. Continue progressive overload as per policy without signs/symptoms or physical distress.     Resistance Training   Training Prescription Yes Yes Yes Yes Yes   Weight 2 2 2 2 3    Reps 10-12 10-12 10-12 10-12 10-12     NuStep   Level 2 2 3 3 3    Watts 12 16 13 20 14    Minutes 20 20 20 20 20    METs 3.56 2.2 3.56 3.56 3.56     Arm Ergometer   Level 2.3 2.2 2.3 2.6 2.8   Watts 3 3 2 4 7    Minutes 15 15 15 15 15    METs 2.3 2 2.3 2.6 2.8     Home Exercise Plan   Plans to continue exercise at Day   Frequency Add 2 additional days to program exercise sessions. Add 2 additional days to program exercise sessions. Add 2 additional days to program exercise sessions. Add 2 additional days to program exercise sessions. Add 2 additional days to program exercise sessions.      Exercise Comments:      Exercise Comments    Row Name 09/29/16 1450 10/29/16 1245 11/25/16 1440 12/27/16 0832 01/25/17 1242   Exercise  Comments Patient is proggressing well Patient is progressing but has not been attending regularly  Patient is progressing appropriately  Patient is proggressing appropriately although her attendance is lacking Patient is progressing well       Discharge Exercise Prescription (Final Exercise Prescription Changes):     Exercise Prescription Changes - 01/25/17 1200      Exercise Review   Progression Yes     Response to Exercise   Blood Pressure (Admit) 132/70   Blood Pressure (Exercise) 148/72   Blood Pressure (Exit) 138/74   Heart Rate (Admit) 62 bpm   Heart Rate  (Exercise) 75 bpm   Heart Rate (Exit) 71 bpm   Rating of Perceived Exertion (Exercise) 11   Duration Progress to 30 minutes of continuous aerobic without signs/symptoms of physical distress   Intensity Rest + 30     Progression   Progression Continue progressive overload as per policy without signs/symptoms or physical distress.     Resistance Training   Training Prescription Yes   Weight 3   Reps 10-12     NuStep   Level 3   Watts 14   Minutes 20   METs 3.56     Arm Ergometer   Level 2.8   Watts 7   Minutes 15   METs 2.8     Home Exercise Plan   Plans to continue exercise at Home   Frequency Add 2 additional days to program exercise sessions.      Nutrition:  Target Goals: Understanding of nutrition guidelines, daily intake of sodium 1500mg , cholesterol 200mg , calories 30% from fat and 7% or less from saturated fats, daily to have 5 or more servings of fruits and vegetables.  Biometrics:     Pre Biometrics - 09/21/16 1511      Pre Biometrics   Height 5\' 4"  (1.626 m)   Weight 183 lb 3.2 oz (83.1 kg)   Waist Circumference 38 inches   Hip Circumference 41.5 inches   Waist to Hip Ratio 0.92 %   BMI (Calculated) 31.5   Triceps Skinfold 15 mm   % Body Fat 38.1 %   Grip Strength 55.7 kg   Flexibility 0 in   Single Leg Stand 10 seconds       Nutrition Therapy Plan and Nutrition Goals:   Nutrition Discharge: Rate Your Plate Scores:     Nutrition Assessments - 09/21/16 1630      MEDFICTS Scores   Pre Score 21      Nutrition Goals Re-Evaluation:   Psychosocial: Target Goals: Acknowledge presence or absence of depression, maximize coping skills, provide positive support system. Participant is able to verbalize types and ability to use techniques and skills needed for reducing stress and depression.  Initial Review & Psychosocial Screening:     Initial Psych Review & Screening - 09/21/16 1633      Initial Review   Current issues with --  Main  concern is transportation. she can ride [employer], they are sometimes not reliable.      Family Dynamics   Good Support System? Yes     Barriers   Psychosocial barriers to participate in program There are no identifiable barriers or psychosocial needs.     Screening Interventions   Interventions Encouraged to exercise      Quality of Life Scores:     Quality of Life - 09/21/16 1512      Quality of Life Scores   Health/Function Pre 22.15 %   Socioeconomic Pre 29 %  Psych/Spiritual Pre 30 %   Family Pre 30 %   GLOBAL Pre 26.14 %      PHQ-9: Recent Review Flowsheet Data    Depression screen [facility] 2/9 09/21/2016 05/20/2016 05/12/2016 03/09/2016 02/26/2016   Decreased Interest 0 0 0 0 0   Down, Depressed, Hopeless 0 0 0 0 0   PHQ - 2 Score 0 0 0 0 0   Altered sleeping 0 - - - -   Tired, decreased energy 0 - - - -   Change in appetite 0 - - - -   Feeling bad or failure about yourself  0 - - - -   Trouble concentrating 0 - - - -   Moving slowly or fidgety/restless 0 - - - -   Suicidal thoughts 0 - - - -   PHQ-9 Score 0 - - - -      Psychosocial Evaluation and Intervention:     Psychosocial Evaluation - 09/21/16 1634      Psychosocial Evaluation & Interventions   Interventions Encouraged to exercise with the program and follow exercise prescription   Continued Psychosocial Services Needed No      Psychosocial Re-Evaluation:     Psychosocial Re-Evaluation    Row Name 09/30/16 1203 11/01/16 1427 12/01/16 1255 12/30/16 1459 01/27/17 1552     Psychosocial Re-Evaluation   Interventions Encouraged to attend Cardiac Rehabilitation for the exercise Encouraged to attend Cardiac Rehabilitation for the exercise Encouraged to attend Cardiac Rehabilitation for the exercise Encouraged to attend Cardiac Rehabilitation for the exercise Encouraged to attend Cardiac Rehabilitation for the exercise   Comments Patient's QOL score was 26.14 and her PHQ-9 score was 0. She has no psychosocial  issues.  Patient's QOL score was 26.14 and her PHQ-9 score was 0. She continues to have no psychosocial issues. Patient continues to have no psychosocial issues identified.  Patient continues to have no psychosocial issues identified.  Patient continues to have no psychosocial issues identified.    Continued Psychosocial Services Needed No No No No No      Vocational Rehabilitation: Provide vocational rehab assistance to qualifying candidates.   Vocational Rehab Evaluation & Intervention:     Vocational Rehab - 09/21/16 1626      Initial Vocational Rehab Evaluation & Intervention   Assessment shows need for Vocational Rehabilitation No      Education: Education Goals: Education classes will be provided on a weekly basis, covering required topics. Participant will state understanding/return demonstration of topics presented.  Learning Barriers/Preferences:     Learning Barriers/Preferences - 09/21/16 1626      Learning Barriers/Preferences   Learning Barriers None   Learning Preferences Video;Pictoral      Education Topics: Hypertension, Hypertension Reduction -Define heart disease and high blood pressure. Discus how high blood pressure affects the body and ways to reduce high blood pressure. Flowsheet Row CARDIAC REHAB PHASE II EXERCISE from 01/12/2017 in [place]  Date  (P) 10/20/16  Educator  (P) DC  Instruction Review Code  (P) 2- meets goals/outcomes      Exercise and Your Heart -Discuss why it is important to exercise, the FITT principles of exercise, normal and abnormal responses to exercise, and how to exercise safely.   Angina -Discuss definition of angina, causes of angina, treatment of angina, and how to decrease risk of having angina. Flowsheet Row CARDIAC REHAB PHASE II EXERCISE from 01/12/2017 in [place]  Date  (P) 11/03/16  Educator  (P) Russella Dar  Instruction Review Code  (P) 2- meets goals/outcomes       Cardiac Medications -Review what the following cardiac medications are used for, how they affect the body, and side effects that may occur when taking the medications.  Medications include Aspirin, Beta blockers, calcium channel blockers, ACE Inhibitors, angiotensin receptor blockers, diuretics, digoxin, and antihyperlipidemics.   Congestive Heart Failure -Discuss the definition of CHF, how to live with CHF, the signs and symptoms of CHF, and how keep track of weight and sodium intake. Flowsheet Row CARDIAC REHAB PHASE II EXERCISE from 01/12/2017 in [place]  Date  (P) 11/17/16  Educator  (P) Suzanne Boron  Instruction Review Code  (P) 2- meets goals/outcomes      Heart Disease and Intimacy -Discus the effect sexual activity has on the heart, how changes occur during intimacy as we age, and safety during sexual activity.   Smoking Cessation / COPD -Discuss different methods to quit smoking, the health benefits of quitting smoking, and the definition of COPD. Flowsheet Row CARDIAC REHAB PHASE II EXERCISE from 01/12/2017 in [place]  Date  (P) 12/01/16  Educator  (P) Russella Dar  Instruction Review Code  (P) 2- meets goals/outcomes      Nutrition I: Fats -Discuss the types of cholesterol, what cholesterol does to the heart, and how cholesterol levels can be controlled. Flowsheet Row CARDIAC REHAB PHASE II EXERCISE from 01/12/2017 in [place]  Date  (P) 12/08/16  Educator  (P) Russella Dar  Instruction Review Code  (P) 2- meets goals/outcomes      Nutrition II: Labels -Discuss the different components of food labels and how to read food label   Heart Parts and Heart Disease -Discuss the anatomy of the heart, the pathway of blood circulation through the heart, and these are affected by heart disease.   Stress I: Signs and Symptoms -Discuss the causes of stress, how stress may lead to anxiety and  depression, and ways to limit stress. Flowsheet Row CARDIAC REHAB PHASE II EXERCISE from 01/12/2017 in [place]  Date  (P) 09/29/16  Educator  (P) Russella Dar  Instruction Review Code  (P) 2- meets goals/outcomes      Stress II: Relaxation -Discuss different types of relaxation techniques to limit stress. Flowsheet Row CARDIAC REHAB PHASE II EXERCISE from 01/12/2017 in [place]  Date  (P) 10/06/16  Educator  (P) Russella Dar  Instruction Review Code  (P) 2- meets goals/outcomes      Warning Signs of Stroke / TIA -Discuss definition of a stroke, what the signs and symptoms are of a stroke, and how to identify when someone is having stroke. Flowsheet Row CARDIAC REHAB PHASE II EXERCISE from 01/12/2017 in [employer]  Date  (P) 10/13/16  Educator  (P) Russella Dar  Instruction Review Code  (P) 2- meets goals/outcomes      Knowledge Questionnaire Score:     Knowledge Questionnaire Score - 09/21/16 1626      Knowledge Questionnaire Score   Pre Score --  Patient did not finish pre test.       Core Components/Risk Factors/Patient Goals at Admission:     Personal Goals and Risk Factors at Admission - 09/21/16 1630      Core Components/Risk Factors/Patient Goals on Admission    Weight Management Weight Maintenance   Sedentary Yes   Intervention Provide advice, education, support and counseling about physical activity/exercise needs.;Develop an individualized exercise  prescription for aerobic and resistive training based on initial evaluation findings, risk stratification, comorbidities and participant's personal goals.   Expected Outcomes Achievement of increased cardiorespiratory fitness and enhanced flexibility, muscular endurance and strength shown through measurements of functional capacity and personal statement of participant.   Increase Strength and Stamina Yes   Intervention Provide advice, education,  support and counseling about physical activity/exercise needs.;Develop an individualized exercise prescription for aerobic and resistive training based on initial evaluation findings, risk stratification, comorbidities and participant's personal goals.   Expected Outcomes Achievement of increased cardiorespiratory fitness and enhanced flexibility, muscular endurance and strength shown through measurements of functional capacity and personal statement of participant.   Tobacco Cessation Yes   Intervention Advice worker, assist with locating and accessing local/national Quit Smoking programs, and support quit date choice.   Expected Outcomes Short Term: Will demonstrate readiness to quit, by selecting a quit date.   Diabetes Yes   Intervention Provide education about signs/symptoms and action to take for hypo/hyperglycemia.   Expected Outcomes Short Term: Participant verbalizes understanding of the signs/symptoms and immediate care of hyper/hypoglycemia, proper foot care and importance of medication, aerobic/resistive exercise and nutrition plan for blood glucose control.;Long Term: Attainment of HbA1C < 7%.   Personal Goal Breath better, walk more w/o SOB or chest discomfort.   Intervention attend CR 3x week and supplement exercise 2 x week at home.   Expected Outcomes Reach personal goals.       Core Components/Risk Factors/Patient Goals Review:      Goals and Risk Factor Review    Row Name 09/21/16 1633 09/30/16 1200 11/01/16 1418 12/01/16 1253 12/30/16 1457     Core Components/Risk Factors/Patient Goals Review   Personal Goals Review Increase Strength and Stamina;Sedentary;Tobacco Cessation;Diabetes Weight Management/Obesity;Increase Strength and Stamina;Sedentary;Tobacco Cessation;Other  Walk more w/o SOB.       Weight Management/Obesity;Increase Strength and Stamina;Tobacco Cessation;Sedentary;Other  Breathe better. Walk with decreased SOB. Weight  Management/Obesity;Increase Strength and Stamina;Tobacco Cessation;Improve shortness of breath with ADL's  Walk more without SOB. Weight Management/Obesity;Increase Strength and Stamina;Diabetes;Improve shortness of breath with ADL's  Breath better, walk more w/o SOB or chest discomfort.   Review  - Patient has attended 3 sessions. She has lost 1.7 lbs. She is doing well. Will continue to monitor for progress. Patient has attended 12 sessions. She is maintaining her weight. She is getting stronger and her SOB is improving. She continues to smoke.  Patient has attended 18 sessions losing 0.9 lbs. She continue to smoke but is still working toward cessation. Her strength and stamina are increasing with improved SOB. Patient has attended 21 sessions. Her attendance has been inconsistent. She has maintained her weight. She has progressed some with some improvement in her strength and stamina and SOB.    Expected Outcomes  - Patient will continue to attend sessions and meet her personal goals.  Patient will complete the program meeting her personal goals.  Patient will continue to attend sessions meeting her personal goals.  Patient will attend sessions consistently and complete the program meeting her personal goals.    [place] Name 01/27/17 1544             Core Components/Risk Factors/Patient Goals Review   Personal Goals Review Weight Management/Obesity;Sedentary;Increase Strength and Stamina;Tobacco Cessation;Improve shortness of breath with ADL's  Walk more w/o SOB.        Review Patient has attended 25 sessions. She has maintained her weight. Her attendance has been inconsistent. She has had some progression. Her  DM is uncontrolled. Her last visit was 01/17/17. At that visit she was sent to the ER for dizziness elevated b/p and "feeling funny". She has been cleared by her Cardiologist to return. She has not returned yet.        Expected Outcomes Patient will return and complete the program meeting her  personal goals.           Core Components/Risk Factors/Patient Goals at Discharge (Final Review):      Goals and Risk Factor Review - 01/27/17 1544      Core Components/Risk Factors/Patient Goals Review   Personal Goals Review Weight Management/Obesity;Sedentary;Increase Strength and Stamina;Tobacco Cessation;Improve shortness of breath with ADL's  Walk more w/o SOB.    Review Patient has attended 25 sessions. She has maintained her weight. Her attendance has been inconsistent. She has had some progression. Her DM is uncontrolled. Her last visit was 01/17/17. At that visit she was sent to the ER for dizziness elevated b/p and "feeling funny". She has been cleared by her Cardiologist to return. She has not returned yet.    Expected Outcomes Patient will return and complete the program meeting her personal goals.       ITP Comments:     ITP Comments    Row Name 01/31/17 1447           ITP Comments Patient stopped coming after 25 visits. Her attendance was inconsistent through-out the program. Her DM remains uncontrolled. The last session patient attended, she was sent to the ER d/t feeling dizzy and feeling "funny" in her chest. She never returned after that session. She said she wanted to be dropped from the program.           Comments: Patient stopped coming to [facility]hab on 01/17/17. Her attendance was inconsistent through-out the program. Her DM remains uncontrolled. The last session patient attended, she was sent to the ER d/t feeling dizzy and feeling "funny" in her chest. She never returned after that session. She said she wanted to be dropped from the program. Doctor will be informed.

## 2017-02-02 ENCOUNTER — Encounter (HOSPITAL_COMMUNITY): Payer: Medicaid Other

## 2017-02-02 ENCOUNTER — Other Ambulatory Visit: Payer: Self-pay | Admitting: "Endocrinology

## 2017-02-04 ENCOUNTER — Encounter (HOSPITAL_COMMUNITY): Payer: Medicaid Other

## 2017-02-15 ENCOUNTER — Ambulatory Visit (INDEPENDENT_AMBULATORY_CARE_PROVIDER_SITE_OTHER): Payer: Medicaid Other | Admitting: "Endocrinology

## 2017-02-15 ENCOUNTER — Encounter: Payer: Self-pay | Admitting: "Endocrinology

## 2017-02-15 VITALS — BP 138/74 | HR 77 | Ht 63.0 in | Wt 178.0 lb

## 2017-02-15 DIAGNOSIS — E1159 Type 2 diabetes mellitus with other circulatory complications: Secondary | ICD-10-CM

## 2017-02-15 DIAGNOSIS — E782 Mixed hyperlipidemia: Secondary | ICD-10-CM | POA: Diagnosis not present

## 2017-02-15 DIAGNOSIS — E1165 Type 2 diabetes mellitus with hyperglycemia: Secondary | ICD-10-CM

## 2017-02-15 DIAGNOSIS — N183 Chronic kidney disease, stage 3 (moderate): Secondary | ICD-10-CM | POA: Diagnosis not present

## 2017-02-15 DIAGNOSIS — IMO0002 Reserved for concepts with insufficient information to code with codable children: Secondary | ICD-10-CM

## 2017-02-15 DIAGNOSIS — Z794 Long term (current) use of insulin: Secondary | ICD-10-CM

## 2017-02-15 DIAGNOSIS — E1122 Type 2 diabetes mellitus with diabetic chronic kidney disease: Secondary | ICD-10-CM

## 2017-02-15 DIAGNOSIS — Z9119 Patient's noncompliance with other medical treatment and regimen: Secondary | ICD-10-CM | POA: Diagnosis not present

## 2017-02-15 DIAGNOSIS — Z91199 Patient's noncompliance with other medical treatment and regimen due to unspecified reason: Secondary | ICD-10-CM

## 2017-02-15 NOTE — Patient Instructions (Signed)

## 2017-02-15 NOTE — Progress Notes (Signed)
Subjective:    Patient ID: Christine Harvey, female    DOB: 19-Jun-1956. Patient is being seen in f/u for management of diabetes requested by  Celedonio Savage, MD  Past Medical History:  Diagnosis Date  . 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  some iInvolvement long the Clival bone and Effect upon the Pons - s/p sterotactic radiosurgery in 2014.  Marland Kitchen CAD (coronary artery disease)    80-99% first diagonal (unable to be intervened upon), 60% circumflex, 75% RCA August 2017  . Depression   . Essential hypertension   . Frequent headaches   . Hyperlipidemia   . Morbid obesity ([place])   . Osteoarthritis   . Photophobia    With Headaches  . S/P radiation therapy 05/02/13   25 Gy at 5 Gy per fraction, last treatment on 04/30/13  . ST elevation myocardial infarction (STEMI) of inferolateral wall [facility])    August 2017  . Type 2 diabetes mellitus ([place])    Past Surgical History:  Procedure Laterality Date  . CARDIAC CATHETERIZATION N/A 07/31/2016   Procedure: Left Heart Cath and Coronary Angiography;  Surgeon: Lorretta Harp, MD;  Location: [facility];  Service: Cardiovascular;  Laterality: N/A;  . TONSILLECTOMY     As a child   Social History   Social History  . Marital status: Single    Spouse name: N/A  . Number of children: N/A  . Years of education: N/A   Social History Main Topics  . Smoking status: Light Tobacco Smoker    Types: Cigarettes  . Smokeless tobacco: Never Used  . Alcohol use Yes     Comment: 2010 -  Alcohol Rehabilitation/ Drinks Occassionally  . Drug use: No  . Sexual activity: Not Currently    Partners: Male   Other Topics Concern  . None   Social History Narrative  . None   Outpatient Encounter Prescriptions as of 02/15/2017  Medication Sig  . acyclovir (ZOVIRAX) 200 MG capsule Take 200 mg by mouth as needed (for cold sores).   Marland Kitchen aspirin EC 81 MG EC tablet Take 1 tablet (81 mg total) by mouth  daily.  Marland Kitchen atorvastatin (LIPITOR) 80 MG tablet Take 1 tablet (80 mg total) by mouth daily at 6 PM.  . Blood Glucose Monitoring Suppl (ACCU-CHEK AVIVA) device Use as instructed  . calcipotriene-betamethasone (TACLONEX SCALP) external suspension Apply topically.  . carvedilol (COREG) 25 MG tablet TAKE 1 TABLET BY MOUTH TWICE DAILY WITH A MEAL.  . clobetasol (TEMOVATE) 0.05 % external solution Apply to affected areas on scalp once daily. Never to face.  . Clobetasol Propionate (CLOBEX) 0.05 % shampoo APPLY TO SCALP ONCE DAILY IN SHOWER, THEN RINSE.  Marland Kitchen clopidogrel (PLAVIX) 75 MG tablet Take 1 tablet (75 mg total) by mouth daily with breakfast.  . FLUOCINOLONE ACETONIDE SCALP 0.01 % OIL APPLY TO SCALP EVERY DAY AS DIRECTED  . glucose blood (ACCU-CHEK AVIVA) test strip Test glucose 4 times a day E11.65  . HYDROcodone-acetaminophen (NORCO/VICODIN) 5-325 MG tablet One tablet by mouth every six hours as needed for pain.  Seven day limit per Medicaid guidelines.  . Insulin Glargine (LANTUS SOLOSTAR) 100 UNIT/ML Solostar Pen Inject 50 Units into the skin at bedtime.  . isosorbide mononitrate (IMDUR) 30 MG 24 hr tablet Take 1 tablet (30 mg total) by mouth daily.  Elmore Guise Devices (ACCU-CHEK SOFTCLIX) lancets Use as instructed  .  Lancets MISC Test 4 x daily. E11.65. Accu-Chek Aviva  . losartan (COZAAR) 100 MG tablet Take 1 tablet (100 mg total) by mouth daily.  . metFORMIN (GLUCOPHAGE) 500 MG tablet TAKE 1 TABLET BY MOUTH TWICE DAILY WITH A MEAL  . nitroGLYCERIN (NITROSTAT) 0.4 MG SL tablet Place 1 tablet (0.4 mg total) under the tongue every 5 (five) minutes x 3 doses as needed for chest pain.   No facility-administered encounter medications on file as of 02/15/2017.    ALLERGIES: Allergies  Allergen Reactions  . Sulfa Antibiotics Other (See Comments)    Passes out   VACCINATION STATUS:  There is no immunization history on file for this patient.  Diabetes  She presents for her follow-up diabetic  visit. She has type 2 diabetes mellitus. Onset time: She was diagnosed at approximately 61 years of age. Her disease course has been worsening. There are no hypoglycemic associated symptoms. Pertinent negatives for hypoglycemia include no confusion, headaches, pallor or seizures. Associated symptoms include fatigue, polydipsia and polyuria. Pertinent negatives for diabetes include no chest pain and no polyphagia. There are no hypoglycemic complications. Symptoms are worsening. Diabetic complications include heart disease and nephropathy. Risk factors for coronary artery disease include dyslipidemia, diabetes mellitus, obesity, hypertension, tobacco exposure, sedentary lifestyle and family history. Current diabetic treatments: She is on Lantus 40 units at bedtime and metformin 500 mg by mouth twice a day. She is following a generally unhealthy diet. When asked about meal planning, she reported none. She has not had a previous visit with a dietitian (She will see the dietitian today.). She never participates in exercise. Home blood sugar record trend: She came with her meter , no logs.  Her meter shows only 13 readings in the last 30 days averaging 169, her A1c is higher at 9.4%.  Her overall blood glucose range is 140-180 mg/dl. An ACE inhibitor/angiotensin II receptor blocker is being taken.  Hypertension  This is a chronic problem. The current episode started more than 1 year ago. Pertinent negatives include no chest pain, headaches, palpitations or shortness of breath. Risk factors for coronary artery disease include smoking/tobacco exposure, diabetes mellitus, dyslipidemia, obesity and sedentary lifestyle. Past treatments include angiotensin blockers.     Review of Systems  Constitutional: Positive for fatigue. Negative for chills, fever and unexpected weight change.  HENT: Negative for trouble swallowing and voice change.   Eyes: Negative for visual disturbance.  Respiratory: Negative for cough,  shortness of breath and wheezing.   Cardiovascular: Negative for chest pain, palpitations and leg swelling.  Gastrointestinal: Negative for diarrhea, nausea and vomiting.  Endocrine: Positive for polydipsia and polyuria. Negative for cold intolerance, heat intolerance and polyphagia.  Musculoskeletal: Negative for arthralgias and myalgias.  Skin: Negative for color change, pallor, rash and wound.  Neurological: Negative for seizures and headaches.  Psychiatric/Behavioral: Negative for confusion and suicidal ideas.    Objective:    BP 138/74   Pulse 77   Ht 5\' 3"  (1.6 m)   Wt 178 lb (80.7 kg)   BMI 31.53 kg/m   Wt Readings from Last 3 Encounters:  02/15/17 178 lb (80.7 kg)  01/26/17 178 lb (80.7 kg)  01/19/17 182 lb (82.6 kg)    Physical Exam  Constitutional: She is oriented to person, place, and time. She appears well-developed.  HENT:  Head: Normocephalic and atraumatic.  Eyes: EOM are normal.  Neck: Normal range of motion. Neck supple. No tracheal deviation present. No thyromegaly present.  Cardiovascular: Normal rate and regular rhythm.  Pulmonary/Chest: Effort normal and breath sounds normal.  Abdominal: Soft. Bowel sounds are normal. There is no tenderness. There is no guarding.  Musculoskeletal: Normal range of motion. She exhibits no edema.  Neurological: She is alert and oriented to person, place, and time. She has normal reflexes. No cranial nerve deficit. Coordination normal.  Skin: Skin is warm and dry. No rash noted. No erythema. No pallor.  Psychiatric: She has a normal mood and affect. Judgment normal.    CMP     Component Value Date/Time   NA 138 01/17/2017 1139   K 3.9 01/17/2017 1139   CL 105 01/17/2017 1139   CO2 27 01/17/2017 1139   GLUCOSE 159 (H) 01/17/2017 1139   BUN 12 01/17/2017 1139   BUN 9.5 04/24/2015 0937   CREATININE 1.26 (H) 01/17/2017 1139   CREATININE 1.0 04/24/2015 0937   CALCIUM 9.3 01/17/2017 1139   PROT 7.6 01/17/2017 1139    ALBUMIN 3.3 (L) 01/17/2017 1139   AST 40 01/17/2017 1139   ALT 27 01/17/2017 1139   ALKPHOS 83 01/17/2017 1139   BILITOT 0.3 01/17/2017 1139   GFRNONAA 45 (L) 01/17/2017 1139   GFRAA 52 (L) 01/17/2017 1139     Diabetic Labs (most recent): Lab Results  Component Value Date   HGBA1C 9.4 (H) 11/24/2016   HGBA1C 9.0 (H) 07/31/2016   HGBA1C 11.4 05/14/2016    Lipid Panel     Component Value Date/Time   CHOL 90 11/24/2016 1040   TRIG 135 11/24/2016 1040   HDL 33 (L) 11/24/2016 1040   CHOLHDL 2.7 11/24/2016 1040   VLDL 27 11/24/2016 1040   LDLCALC 30 11/24/2016 1040     Assessment & Plan:   1. Uncontrolled type 2 diabetes mellitus with stage 3 chronic kidney disease, with long-term current use of insulin ([place])   -  She has currently uncontrolled symptomatic type 2 DM since  61 years of age. - Unfortunately patient remains disengaged. Her A1c is higher at 9.4%. She has had higher A1c is in the recent past 9%  And 11.4%. Recent labs reviewed. -She does not monitor adequately. Her meter shows only 13 readings in the last 30 days.   Her diabetes is complicated by stage CKD which is improving, recent acute coronary syndrome on medical management and patient remains at a high risk for more acute and chronic complications of diabetes which include CAD, CVA, CKD, retinopathy, and neuropathy. These are all discussed in detail with the patient.  - I have counseled the patient on diet management and weight loss, by adopting a carbohydrate restricted/protein rich diet.  - Suggestion is made for patient to avoid simple carbohydrates   from their diet including Cakes , Desserts, Ice Cream,  Soda (  diet and regular) , Sweet Tea , Candies,  Chips, Cookies, Artificial Sweeteners,   and "Sugar-free" Products . This will help patient to have stable blood glucose profile and potentially avoid unintended weight gain.  - I encouraged the patient to switch to  unprocessed or minimally processed  complex starch and increased protein intake (animal or plant source), fruits, and vegetables.  - Patient is advised to stick to a routine mealtimes to eat 3 meals  a day and avoid unnecessary snacks ( to snack only to correct hypoglycemia).  - The patient will be scheduled with Jearld Fenton, RDN, CDE for individualized DM education.  - I have approached patient with the following individualized plan to manage diabetes and patient agrees:   - Based  on her significant glycemic burden, she would need basal/bolus insulin. - However, unfortunately she is alarmingly noncompliant. She did not commit for proper monitoring of blood glucose for safe use of insulin.   -  Even though it is not enough I  can only proceed with basal insulin Lantus  50  units QHS. - She is asked to start strict monitoring of glucose  AC and HS, and return in 2 weeks for review.  -Her other option would be premixed insulin NovoLog 70/30 or Humalog 75/25 to use twice a day with breakfast and supper. -  I will continue  metformin 500 mg by mouth twice a day. - She has stopped  Invokana due to TV commercials.   -Patient is encouraged to call clinic for blood glucose levels less than 70 or above 300 mg /dl.  - Patient will be considered for incretin therapy as appropriate next visit. - Patient specific target  A1c;  LDL, HDL, Triglycerides, and  Waist Circumference were discussed in detail.  2) BP/HTN:  uncontrolled Continue current medications including ACEI/ARB. 3) Lipids/HPL:  controlled LDL at 57.  4)  Weight/Diet: CDE Consult will be initiated , exercise, and detailed carbohydrates information provided.  5) Chronic Care/Health Maintenance:  -Patient is on ACEI/ARB and statin medications and encouraged to continue to follow up with Ophthalmology, Podiatrist at least yearly or according to recommendations, and advised to   quit smoking. I have recommended yearly flu vaccine and pneumonia vaccination at least every 5  years; moderate intensity exercise for up to 150 minutes weekly; and  sleep for at least 7 hours a day.  - 25 minutes of time was spent on the care of this patient , 50% of which was applied for counseling on diabetes complications and their preventions.  - Patient to bring meter and  blood glucose logs during their next visit.  -Extensive smoking cessation advice and counseling given to her. - I advised patient to maintain close follow up with Celedonio Savage, MD for primary care needs.  Follow up plan: - Return in about 2 weeks (around 03/01/2017) for follow up with meter and logs- no labs.  Glade Lloyd, MD Phone: [phone]  Fax: [phone]   02/15/2017, 8:44 AM

## 2017-03-02 ENCOUNTER — Encounter: Payer: Self-pay | Admitting: "Endocrinology

## 2017-03-02 ENCOUNTER — Ambulatory Visit (INDEPENDENT_AMBULATORY_CARE_PROVIDER_SITE_OTHER): Payer: Medicaid Other | Admitting: "Endocrinology

## 2017-03-02 VITALS — BP 160/91 | HR 67 | Ht 63.0 in | Wt 181.0 lb

## 2017-03-02 DIAGNOSIS — I10 Essential (primary) hypertension: Secondary | ICD-10-CM | POA: Diagnosis not present

## 2017-03-02 DIAGNOSIS — N183 Chronic kidney disease, stage 3 (moderate): Secondary | ICD-10-CM

## 2017-03-02 DIAGNOSIS — IMO0002 Reserved for concepts with insufficient information to code with codable children: Secondary | ICD-10-CM | POA: Insufficient documentation

## 2017-03-02 DIAGNOSIS — E1122 Type 2 diabetes mellitus with diabetic chronic kidney disease: Secondary | ICD-10-CM

## 2017-03-02 DIAGNOSIS — E782 Mixed hyperlipidemia: Secondary | ICD-10-CM | POA: Diagnosis not present

## 2017-03-02 DIAGNOSIS — Z794 Long term (current) use of insulin: Secondary | ICD-10-CM

## 2017-03-02 DIAGNOSIS — E1165 Type 2 diabetes mellitus with hyperglycemia: Secondary | ICD-10-CM

## 2017-03-02 MED ORDER — INSULIN LISPRO 100 UNIT/ML (KWIKPEN): 8.0000 [IU] | PEN_INJECTOR | Freq: Three times a day (TID) | SUBCUTANEOUS | 2 refills | Status: AC

## 2017-03-02 MED ORDER — GLUCOSE BLOOD VI STRP: ORAL_STRIP | 3 refills | Status: AC

## 2017-03-02 NOTE — Progress Notes (Signed)
Subjective:    Patient ID: Christine Harvey, female    DOB: [birth year]. Patient is being seen in f/u for management of diabetes requested by  Celedonio Savage, MD  Past Medical History:  Diagnosis Date  . 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  some iInvolvement long the Clival bone and Effect upon the Pons - s/p sterotactic radiosurgery in 2014.  Marland Kitchen CAD (coronary artery disease)    80-99% first diagonal (unable to be intervened upon), 60% circumflex, 75% RCA August 2017  . Depression   . Essential hypertension   . Frequent headaches   . Hyperlipidemia   . Morbid obesity ([place])   . Osteoarthritis   . Photophobia    With Headaches  . S/P radiation therapy 05/02/13   25 Gy at 5 Gy per fraction, last treatment on 04/30/13  . ST elevation myocardial infarction (STEMI) of inferolateral wall [facility])    August 2017  . Type 2 diabetes mellitus ([place])    Past Surgical History:  Procedure Laterality Date  . CARDIAC CATHETERIZATION N/A 07/31/2016   Procedure: Left Heart Cath and Coronary Angiography;  Surgeon: Lorretta Harp, MD;  Location: [place] [facility];  Service: Cardiovascular;  Laterality: N/A;  . TONSILLECTOMY     As a child   Social History   Social History  . Marital status: Single    Spouse name: N/A  . Number of children: N/A  . Years of education: N/A   Social History Main Topics  . Smoking status: Light Tobacco Smoker    Types: Cigarettes  . Smokeless tobacco: Never Used  . Alcohol use Yes     Comment: 2010 -  Alcohol Rehabilitation/ Drinks Occassionally  . Drug use: No  . Sexual activity: Not Currently    Partners: Male   Other Topics Concern  . None   Social History Narrative  . None   Outpatient Encounter Prescriptions as of 03/02/2017  Medication Sig  . acyclovir (ZOVIRAX) 200 MG capsule Take 200 mg by mouth as needed (for cold sores).   Marland Kitchen aspirin EC 81 MG EC tablet Take 1 tablet (81 mg total) by mouth  daily.  Marland Kitchen atorvastatin (LIPITOR) 80 MG tablet Take 1 tablet (80 mg total) by mouth daily at 6 PM.  . Blood Glucose Monitoring Suppl (ACCU-CHEK AVIVA) device Use as instructed  . calcipotriene-betamethasone (TACLONEX SCALP) external suspension Apply topically.  . carvedilol (COREG) 25 MG tablet TAKE 1 TABLET BY MOUTH TWICE DAILY WITH A MEAL.  . clobetasol (TEMOVATE) 0.05 % external solution Apply to affected areas on scalp once daily. Never to face.  . Clobetasol Propionate (CLOBEX) 0.05 % shampoo APPLY TO SCALP ONCE DAILY IN SHOWER, THEN RINSE.  Marland Kitchen clopidogrel (PLAVIX) 75 MG tablet Take 1 tablet (75 mg total) by mouth daily with breakfast.  . FLUOCINOLONE ACETONIDE SCALP 0.01 % OIL APPLY TO SCALP EVERY DAY AS DIRECTED  . glucose blood (ACCU-CHEK AVIVA) test strip Test glucose 4 times a day E11.65  . glucose blood (ACCU-CHEK AVIVA) test strip Use to test blood glucose 4 times a day.  Marland Kitchen HYDROcodone-acetaminophen (NORCO/VICODIN) 5-325 MG tablet One tablet by mouth every six hours as needed for pain.  Seven day limit per Medicaid guidelines.  . Insulin Glargine (LANTUS SOLOSTAR) 100 UNIT/ML Solostar Pen Inject 50 Units into the skin at bedtime.  . insulin lispro (HUMALOG KWIKPEN) 100 UNIT/ML KiwkPen Inject 0.08-0.14 mLs (8-14  Units total) into the skin 3 (three) times daily.  . isosorbide mononitrate (IMDUR) 30 MG 24 hr tablet Take 1 tablet (30 mg total) by mouth daily.  [place]more Guise Devices (ACCU-CHEK SOFTCLIX) lancets Use as instructed  . Lancets MISC Test 4 x daily. E11.65. Accu-Chek Aviva  . losartan (COZAAR) 100 MG tablet Take 1 tablet (100 mg total) by mouth daily.  . metFORMIN (GLUCOPHAGE) 500 MG tablet TAKE 1 TABLET BY MOUTH TWICE DAILY WITH A MEAL  . nitroGLYCERIN (NITROSTAT) 0.4 MG SL tablet Place 1 tablet (0.4 mg total) under the tongue every 5 (five) minutes x 3 doses as needed for chest pain.   No facility-administered encounter medications on file as of 03/02/2017.    ALLERGIES: Allergies   Allergen Reactions  . Sulfa Antibiotics Other (See Comments)    Passes out   VACCINATION STATUS:  There is no immunization history on file for this patient.  Diabetes  She presents for her follow-up diabetic visit. She has type 2 diabetes mellitus. Onset time: She was diagnosed at approximately 61 years of age. Her disease course has been worsening. There are no hypoglycemic associated symptoms. Pertinent negatives for hypoglycemia include no confusion, headaches, pallor or seizures. Associated symptoms include fatigue, polydipsia and polyuria. Pertinent negatives for diabetes include no chest pain and no polyphagia. There are no hypoglycemic complications. Symptoms are worsening. Diabetic complications include heart disease and nephropathy. Risk factors for coronary artery disease include dyslipidemia, diabetes mellitus, obesity, hypertension, tobacco exposure, sedentary lifestyle and family history. Current diabetic treatments: She is on Lantus 40 units at bedtime and metformin 500 mg by mouth twice a day. Her weight is increasing steadily. She is following a generally unhealthy diet. When asked about meal planning, she reported none. She has had a previous visit with a dietitian. She never participates in exercise. Her breakfast blood glucose range is generally >200 mg/dl. Her lunch blood glucose range is generally >200 mg/dl. Her dinner blood glucose range is generally >200 mg/dl. Her overall blood glucose range is >200 mg/dl. An ACE inhibitor/angiotensin II receptor blocker is being taken.  Hypertension  This is a chronic problem. The current episode started more than 1 year ago. Pertinent negatives include no chest pain, headaches, palpitations or shortness of breath. Risk factors for coronary artery disease include smoking/tobacco exposure, diabetes mellitus, dyslipidemia, obesity and sedentary lifestyle. Past treatments include angiotensin blockers.     Review of Systems  Constitutional:  Positive for fatigue. Negative for chills, fever and unexpected weight change.  HENT: Negative for trouble swallowing and voice change.   Eyes: Negative for visual disturbance.  Respiratory: Negative for cough, shortness of breath and wheezing.   Cardiovascular: Negative for chest pain, palpitations and leg swelling.  Gastrointestinal: Negative for diarrhea, nausea and vomiting.  Endocrine: Positive for polydipsia and polyuria. Negative for cold intolerance, heat intolerance and polyphagia.  Musculoskeletal: Negative for arthralgias and myalgias.  Skin: Negative for color change, pallor, rash and wound.  Neurological: Negative for seizures and headaches.  Psychiatric/Behavioral: Negative for confusion and suicidal ideas.    Objective:    BP (!) 160/91   Pulse 67   Ht 5\' 3"  (1.6 m)   Wt 181 lb (82.1 kg)   BMI 32.06 kg/m   Wt Readings from Last 3 Encounters:  03/02/17 181 lb (82.1 kg)  02/15/17 178 lb (80.7 kg)  01/26/17 178 lb (80.7 kg)    Physical Exam  Constitutional: She is oriented to person, place, and time. She appears well-developed.  HENT:  Head: Normocephalic and atraumatic.  Eyes: EOM are normal.  Neck: Normal range of motion. Neck supple. No tracheal deviation present. No thyromegaly present.  Cardiovascular: Normal rate and regular rhythm.   Pulmonary/Chest: Effort normal and breath sounds normal.  Abdominal: Soft. Bowel sounds are normal. There is no tenderness. There is no guarding.  Musculoskeletal: Normal range of motion. She exhibits no edema.  Neurological: She is alert and oriented to person, place, and time. She has normal reflexes. No cranial nerve deficit. Coordination normal.  Skin: Skin is warm and dry. No rash noted. No erythema. No pallor.  Psychiatric: She has a normal mood and affect. Judgment normal.    CMP     Component Value Date/Time   NA 138 01/17/2017 1139   K 3.9 01/17/2017 1139   CL 105 01/17/2017 1139   CO2 27 01/17/2017 1139    GLUCOSE 159 (H) 01/17/2017 1139   BUN 12 01/17/2017 1139   BUN 9.5 04/24/2015 0937   CREATININE 1.26 (H) 01/17/2017 1139   CREATININE 1.0 04/24/2015 0937   CALCIUM 9.3 01/17/2017 1139   PROT 7.6 01/17/2017 1139   ALBUMIN 3.3 (L) 01/17/2017 1139   AST 40 01/17/2017 1139   ALT 27 01/17/2017 1139   ALKPHOS 83 01/17/2017 1139   BILITOT 0.3 01/17/2017 1139   GFRNONAA 45 (L) 01/17/2017 1139   GFRAA 52 (L) 01/17/2017 1139     Diabetic Labs (most recent): Lab Results  Component Value Date   HGBA1C 9.4 (H) 11/24/2016   HGBA1C 9.0 (H) 07/31/2016   HGBA1C 11.4 05/14/2016    Lipid Panel     Component Value Date/Time   CHOL 90 11/24/2016 1040   TRIG 135 11/24/2016 1040   HDL 33 (L) 11/24/2016 1040   CHOLHDL 2.7 11/24/2016 1040   VLDL 27 11/24/2016 1040   LDLCALC 30 11/24/2016 1040     Assessment & Plan:   1. Uncontrolled type 2 diabetes mellitus with stage 3 chronic kidney disease, with long-term current use of insulin ([place])   -  She has currently uncontrolled symptomatic type 2 DM since  61 years of age. - Unfortunately patient remains disengaged. Her recent  A1c is higher at 9.4%. She has had higher A1c is in the recent past 9%  And 11.4%. Recent labs reviewed. -She does not monitor adequately. Her meter shows only 13 readings in the last 30 days.   Her diabetes is complicated by stage CKD which is improving, recent acute coronary syndrome on medical management and patient remains at a high risk for more acute and chronic complications of diabetes which include CAD, CVA, CKD, retinopathy, and neuropathy. These are all discussed in detail with the patient.  - I have counseled the patient on diet management and weight loss, by adopting a carbohydrate restricted/protein rich diet.  - Suggestion is made for patient to avoid simple carbohydrates   from their diet including Cakes , Desserts, Ice Cream,  Soda (  diet and regular) , Sweet Tea , Candies,  Chips, Cookies, Artificial  Sweeteners,   and "Sugar-free" Products . This will help patient to have stable blood glucose profile and potentially avoid unintended weight gain.  - I encouraged the patient to switch to  unprocessed or minimally processed complex starch and increased protein intake (animal or plant source), fruits, and vegetables.  - Patient is advised to stick to a routine mealtimes to eat 3 meals  a day and avoid unnecessary snacks ( to snack only to correct hypoglycemia).  -  The patient will be scheduled with Jearld Fenton, RDN, CDE for individualized DM education.  - I have approached patient with the following individualized plan to manage diabetes and patient agrees:   - Based on her significant glycemic burden, she will need basal/bolus insulin. - She is approached for that plan and she agrees. - I advised her to continue Lantus 50 units daily at bedtime, continue monitoring blood glucose before meals and at bedtime. -I will initiate prandial insulin with Humalog 8 units 3 times a day before meals for pre-meal blood glucose above 90 mg/dL.  - She is asked to start strict monitoring of glucose  AC and HS, and return in 1 week  with her meter and logs.  -Her other option would be premixed insulin NovoLog 70/30 or Humalog 75/25 to use twice a day with breakfast and supper. -  I will continue  metformin 500 mg by mouth twice a day. - She has stopped  Invokana due to TV commercials.   -Patient is encouraged to call clinic for blood glucose levels less than 70 or above 300 mg /dl.  - Patient will be considered for incretin therapy as appropriate next visit. - Patient specific target  A1c;  LDL, HDL, Triglycerides, and  Waist Circumference were discussed in detail.  2) BP/HTN:  uncontrolled Continue current medications including ACEI/ARB. 3) Lipids/HPL:  controlled LDL at 57.  4)  Weight/Diet: CDE Consult will be initiated , exercise, and detailed carbohydrates information provided.  5) Chronic  Care/Health Maintenance:  -Patient is on ACEI/ARB and statin medications and encouraged to continue to follow up with Ophthalmology, Podiatrist at least yearly or according to recommendations, and advised to   quit smoking. I have recommended yearly flu vaccine and pneumonia vaccination at least every 5 years; moderate intensity exercise for up to 150 minutes weekly; and  sleep for at least 7 hours a day.  - 25 minutes of time was spent on the care of this patient , 50% of which was applied for counseling on diabetes complications and their preventions.  - Patient to bring meter and  blood glucose logs during their next visit.  -Extensive smoking cessation advice and counseling given to her. - I advised patient to maintain close follow up with Celedonio Savage, MD for primary care needs.  Follow up plan: - Return in about 1 week (around 03/09/2017) for follow up with meter and logs- no labs.  Glade Lloyd, MD Phone: [phone]  Fax: [phone]   03/02/2017, 11:24 AM

## 2017-03-02 NOTE — Patient Instructions (Signed)

## 2017-03-04 ENCOUNTER — Other Ambulatory Visit: Payer: Self-pay | Admitting: "Endocrinology

## 2017-03-16 ENCOUNTER — Ambulatory Visit: Payer: Medicaid Other | Admitting: "Endocrinology

## 2017-03-24 ENCOUNTER — Other Ambulatory Visit: Payer: Self-pay | Admitting: Radiation Therapy

## 2017-03-24 DIAGNOSIS — D329 Benign neoplasm of meninges, unspecified: Secondary | ICD-10-CM

## 2017-04-20 NOTE — Progress Notes (Deleted)
Cardiology Office Note  Date: 04/20/2017   ID: Christine Harvey, DOB 11/30/1956, MRN [id number]  PCP: Celedonio Savage, MD  Primary Cardiologist: Rozann Lesches, MD   No chief complaint on file.   History of Present Illness: Christine Harvey is a 61 y.o. female that I met in in January.  She continues with endocrinology follow-up per Dr. Dorris Fetch.  Past Medical History:  Diagnosis Date  . 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  some iInvolvement long the Clival bone and Effect upon the Pons - s/p sterotactic radiosurgery in 2014.  Marland Kitchen CAD (coronary artery disease)    80-99% first diagonal (unable to be intervened upon), 60% circumflex, 75% RCA August 2017  . Depression   . Essential hypertension   . Frequent headaches   . Hyperlipidemia   . Morbid obesity ([place])   . Osteoarthritis   . Photophobia    With Headaches  . S/P radiation therapy 05/02/13   25 Gy at 5 Gy per fraction, last treatment on 04/30/13  . ST elevation myocardial infarction (STEMI) of inferolateral wall [facility])    August 2017  . Type 2 diabetes mellitus ([place])     Past Surgical History:  Procedure Laterality Date  . CARDIAC CATHETERIZATION N/A 07/31/2016   Procedure: Left Heart Cath and Coronary Angiography;  Surgeon: Lorretta Harp, MD;  Location: [place] [facility];  Service: Cardiovascular;  Laterality: N/A;  . TONSILLECTOMY     As a child    Current Outpatient Prescriptions  Medication Sig Dispense Refill  . acyclovir (ZOVIRAX) 200 MG capsule Take 200 mg by mouth as needed (for cold sores).     Marland Kitchen aspirin EC 81 MG EC tablet Take 1 tablet (81 mg total) by mouth daily.    Marland Kitchen atorvastatin (LIPITOR) 80 MG tablet Take 1 tablet (80 mg total) by mouth daily at 6 PM. 30 tablet 12  . Blood Glucose Monitoring Suppl (ACCU-CHEK AVIVA) device Use as instructed 1 each 0  . calcipotriene-betamethasone (TACLONEX SCALP) external suspension Apply topically.    . carvedilol  (COREG) 25 MG tablet TAKE 1 TABLET BY MOUTH TWICE DAILY WITH A MEAL.  12  . clobetasol (TEMOVATE) 0.05 % external solution Apply to affected areas on scalp once daily. Never to face.    . Clobetasol Propionate (CLOBEX) 0.05 % shampoo APPLY TO SCALP ONCE DAILY IN SHOWER, THEN RINSE.    Marland Kitchen clopidogrel (PLAVIX) 75 MG tablet Take 1 tablet (75 mg total) by mouth daily with breakfast. 30 tablet 12  . FLUOCINOLONE ACETONIDE SCALP 0.01 % OIL APPLY TO SCALP EVERY DAY AS DIRECTED    . glucose blood (ACCU-CHEK AVIVA) test strip Test glucose 4 times a day E11.65 150 each 3  . glucose blood (ACCU-CHEK AVIVA) test strip Use to test blood glucose 4 times a day. 150 each 3  . HYDROcodone-acetaminophen (NORCO/VICODIN) 5-325 MG tablet One tablet by mouth every six hours as needed for pain.  Seven day limit per Medicaid guidelines. 28 tablet 0  . Insulin Glargine (LANTUS SOLOSTAR) 100 UNIT/ML Solostar Pen Inject 50 Units into the skin at bedtime.    . insulin lispro (HUMALOG KWIKPEN) 100 UNIT/ML KiwkPen Inject 0.08-0.14 mLs (8-14 Units total) into the skin 3 (three) times daily. 5 pen 2  . INVOKANA 100 MG TABS tablet TAKE 1 TABLET BY MOUTH DAILY BEFORE BREAKFAST. 30 tablet 2  . isosorbide mononitrate (IMDUR) 30 MG  24 hr tablet Take 1 tablet (30 mg total) by mouth daily. 90 tablet 3  . Lancet Devices (ACCU-CHEK SOFTCLIX) lancets Use as instructed 1 each 0  . Lancets MISC Test 4 x daily. E11.65. Accu-Chek Aviva 200 each 5  . losartan (COZAAR) 100 MG tablet Take 1 tablet (100 mg total) by mouth daily. 30 tablet 12  . metFORMIN (GLUCOPHAGE) 500 MG tablet TAKE 1 TABLET BY MOUTH TWICE DAILY WITH A MEAL 60 tablet 2  . nitroGLYCERIN (NITROSTAT) 0.4 MG SL tablet Place 1 tablet (0.4 mg total) under the tongue every 5 (five) minutes x 3 doses as needed for chest pain. 25 tablet 2   No current facility-administered medications for this visit.    Allergies:  Sulfa antibiotics   Social History: The patient  reports that she has  been smoking Cigarettes.  She has never used smokeless tobacco. She reports that she drinks alcohol. She reports that she does not use drugs.   Family History: The patient's family history includes CAD in her mother; Cancer in her sister; Hypertension in her father and mother; Stroke in her father.   ROS:  Please see the history of present illness. Otherwise, complete review of systems is positive for {NONE DEFAULTED:18576::"none"}.  All other systems are reviewed and negative.   Physical Exam: VS:  There were no vitals taken for this visit., BMI There is no height or weight on file to calculate BMI.  Wt Readings from Last 3 Encounters:  03/02/17 181 lb (82.1 kg)  02/15/17 178 lb (80.7 kg)  01/26/17 178 lb (80.7 kg)    General: Patient appears comfortable at rest. HEENT: Conjunctiva and lids normal, oropharynx clear. Neck: Supple, no elevated JVP or carotid bruits, no thyromegaly. Lungs: Clear to auscultation, nonlabored breathing at rest. Cardiac: Regular rate and rhythm, no S3, 2/6 systolic murmur, no pericardial rub. Abdomen: Soft, nontender, bowel sounds present, no guarding or rebound. Extremities: No pitting edema, distal pulses 2+. Skin: Warm and dry. Musculoskeletal: No kyphosis. Neuropsychiatric: Alert and oriented x3, affect grossly appropriate.  ECG: I personally reviewed the tracing from 01/17/2017 which showed sinus rhythm.  Recent Labwork: 01/17/2017: ALT 27; AST 40; BUN 12; Creatinine, Ser 1.26; Hemoglobin 12.3; Platelets 314; Potassium 3.9; Sodium 138     Component Value Date/Time   CHOL 90 11/24/2016 1040   TRIG 135 11/24/2016 1040   HDL 33 (L) 11/24/2016 1040   CHOLHDL 2.7 11/24/2016 1040   VLDL 27 11/24/2016 1040   LDLCALC 30 11/24/2016 1040    Other Studies Reviewed Today:  Echocardiogram 08/01/2016: Study Conclusions  - Left ventricle: The cavity size was normal. Systolic function was normal. Hypokinesis of the anterolateral myocardium.  Doppler parameters are consistent with abnormal left ventricular relaxation (grade 1 diastolic dysfunction). Doppler parameters are consistent with high ventricular filling pressure. - Aortic valve: Functionally bicuspid; mildly thickened, mildly calcified leaflets. Valve mobility was restricted. There was mild stenosis. There was no regurgitation. Valve area (VTI): 1.69 cm^2. Valve area (Vmax): 1.61 cm^2. Valve area (Vmean): 1.38 cm^2. - Mitral valve: Transvalvular velocity was within the normal range. There was no evidence for stenosis. There was trivial regurgitation. - Left atrium: The atrium was moderately dilated. - Right ventricle: The cavity size was normal. Wall thickness was normal. Systolic function was normal. - Tricuspid valve: There was trivial regurgitation. - Pulmonary arteries: Systolic pressure was within the normal range. PA peak pressure: 20 mm Hg (S).  Cardiac catheterization 07/31/2016:  Mid RCA lesion, 75 %stenosed.  Prox Cx  to Mid Cx lesion, 60 %stenosed.  Ost 1st Diag to 1st Diag lesion, 99 %stenosed.  1st Diag lesion, 80 %stenosed.  There is mild left ventricular systolic dysfunction.  The left ventricular ejection fraction is 50-55% by visual estimate.  IMPRESSION:Unsuccessful attempt at PCI of a high-grade high first diagonal branch in the setting of a high lateral STEMI. Because of the angulation takeoff, tortuosity of the vessel and calcification of the lesion I was unable to cross the lesion successfully and intervene. The patient was pain-free at the end of the procedure. I recommend continued medical therapy.  Assessment and Plan:   Current medicines were reviewed with the patient today.  No orders of the defined types were placed in this encounter.   Disposition:  Signed, Satira Sark, MD, [facility] 04/20/2017 9:35 AM    [facility] [place]. [address], [place],   [address] Phone: [phone]; Fax: [phone]

## 2017-04-21 ENCOUNTER — Ambulatory Visit: Payer: Medicaid Other | Admitting: Cardiology

## 2017-04-21 ENCOUNTER — Encounter: Payer: Self-pay | Admitting: Cardiology

## 2017-04-23 ENCOUNTER — Other Ambulatory Visit: Payer: Self-pay | Admitting: "Endocrinology

## 2017-05-04 ENCOUNTER — Ambulatory Visit
Admission: RE | Admit: 2017-05-04 | Discharge: 2017-05-04 | Disposition: A | Discharge status: Home / Self Care | Payer: Medicaid Other | Source: Ambulatory Visit | Attending: Radiation Oncology | Admitting: Radiation Oncology

## 2017-05-04 DIAGNOSIS — D329 Benign neoplasm of meninges, unspecified: Secondary | ICD-10-CM

## 2017-05-04 MED ORDER — GADOBENATE DIMEGLUMINE 529 MG/ML IV SOLN
17.0000 mL | Freq: Once | INTRAVENOUS | Status: AC | PRN
  Administered 2017-05-04: 17 mL via INTRAVENOUS

## 2017-05-10 ENCOUNTER — Ambulatory Visit
Admission: RE | Admit: 2017-05-10 | Discharge: 2017-05-10 | Disposition: A | Discharge status: Home / Self Care | Payer: Medicaid Other | Source: Ambulatory Visit | Attending: Radiation Oncology | Admitting: Radiation Oncology

## 2017-05-10 ENCOUNTER — Encounter: Payer: Self-pay | Admitting: Radiation Oncology

## 2017-05-10 ENCOUNTER — Other Ambulatory Visit: Payer: Self-pay

## 2017-05-10 DIAGNOSIS — Z7982 Long term (current) use of aspirin: Secondary | ICD-10-CM | POA: Diagnosis not present

## 2017-05-10 DIAGNOSIS — D320 Benign neoplasm of cerebral meninges: Secondary | ICD-10-CM | POA: Insufficient documentation

## 2017-05-10 DIAGNOSIS — Z8589 Personal history of malignant neoplasm of other organs and systems: Secondary | ICD-10-CM | POA: Insufficient documentation

## 2017-05-10 DIAGNOSIS — Z923 Personal history of irradiation: Secondary | ICD-10-CM | POA: Diagnosis not present

## 2017-05-10 DIAGNOSIS — Z79899 Other long term (current) drug therapy: Secondary | ICD-10-CM | POA: Diagnosis not present

## 2017-05-10 DIAGNOSIS — D329 Benign neoplasm of meninges, unspecified: Secondary | ICD-10-CM

## 2017-05-10 MED ORDER — INSULIN ASPART 100 UNIT/ML FLEXPEN: 8.0000 [IU] | PEN_INJECTOR | Freq: Three times a day (TID) | SUBCUTANEOUS | 2 refills | Status: AC

## 2017-05-10 NOTE — Progress Notes (Signed)
Christine Harvey presents for follow up of radiation completed 04/30/13 to her Right clivus meningioma target. She reports pain a 9/10 to the top area of her head. She reports the headaches have increased recently related to stressors in her life. She got relief from hydrocodone, but does not have any available to her at this time. She tells me that her gait is unsteady and she loses her balance at times during the day. She will occasionally use a walker or a cane, but does not have it with her today. She denies dizziness or nausea. She is here to get results from her MRI brain 05/04/17.   BP (!) 157/81   Pulse 66   Temp 97.8 F (36.6 C)   Ht 5\' 3"  (1.6 m)   Wt 175 lb (79.4 kg)   SpO2 100% Comment: room air  BMI 31.00 kg/m    Wt Readings from Last 3 Encounters:  05/10/17 175 lb (79.4 kg)  03/02/17 181 lb (82.1 kg)  02/15/17 178 lb (80.7 kg)

## 2017-05-10 NOTE — Progress Notes (Addendum)
Radiation Oncology         [phone] ________________________________  Name: Christine Harvey MRN: [id number]  Date: 05/10/2017  DOB: 1956/02/27  Follow-Up Visit Note  CC: Celedonio Savage, MD    Diagnosis:     ICD-9-CM ICD-10-CM   1. Benign meningioma ([place]) 225.2 D32.0 Ambulatory referral to Neurology     Ambulatory referral to Physical Therapy   Right clivus meningioma target was treated using 12 IMRT Beams to a prescription dose of 25 Gy at 5 Gy per fraction, last treatment on 04/30/13.   Narrative:  The patient returns today for routine follow-up of radiation therapy. MRI of brain on 05/04/17 revealed that her meningioma is stable and unchanged. There is mass-effect on the basilar and pons, which is unchanged.   On review of systems, the patient reports pain 9/10 to the top of her head. She reports headaches which have increased in frequency recently, and are related to stressors in her life. She reports relief with hydrocodone, but does not have any available to her at this time. She reports her gait is unsteady and she loses her balance at times during the day. The patient reports occasionally using a walker or a cane to aid with ambulation, but she does not have it with her today. She denies dizziness or nausea.   The patient reports her brother passed away recently. He had cancer and was in Hospice care. Additionally, the patient reports her sister has Lupus and cancer of the spine.  She is no longer following with neurology for her symptoms. She asks for hydrocodone for her HAs today.  ALLERGIES:  is allergic to sulfa antibiotics.  Meds: Current Outpatient Prescriptions  Medication Sig Dispense Refill  . acyclovir (ZOVIRAX) 200 MG capsule Take 200 mg by mouth as needed (for cold sores).     Marland Kitchen aspirin EC 81 MG EC tablet Take 1 tablet (81 mg total) by mouth daily.    Marland Kitchen atorvastatin (LIPITOR) 80 MG tablet Take 1 tablet (80 mg total) by mouth daily at 6 PM. 30 tablet 12  . Blood  Glucose Monitoring Suppl (ACCU-CHEK AVIVA) device Use as instructed 1 each 0  . calcipotriene-betamethasone (TACLONEX SCALP) external suspension Apply topically.    . carvedilol (COREG) 25 MG tablet TAKE 1 TABLET BY MOUTH TWICE DAILY WITH A MEAL.  12  . clobetasol (TEMOVATE) 0.05 % external solution Apply to affected areas on scalp once daily. Never to face.    . Clobetasol Propionate (CLOBEX) 0.05 % shampoo APPLY TO SCALP ONCE DAILY IN SHOWER, THEN RINSE.    Marland Kitchen clopidogrel (PLAVIX) 75 MG tablet Take 1 tablet (75 mg total) by mouth daily with breakfast. 30 tablet 12  . FLUOCINOLONE ACETONIDE SCALP 0.01 % OIL APPLY TO SCALP EVERY DAY AS DIRECTED    . glucose blood (ACCU-CHEK AVIVA) test strip Test glucose 4 times a day E11.65 150 each 3  . glucose blood (ACCU-CHEK AVIVA) test strip Use to test blood glucose 4 times a day. 150 each 3  . insulin aspart (NOVOLOG FLEXPEN) 100 UNIT/ML FlexPen Inject 8-16 Units into the skin 3 (three) times daily with meals. 15 mL 2  . Insulin Glargine (LANTUS SOLOSTAR) 100 UNIT/ML Solostar Pen Inject 50 Units into the skin at bedtime.    . insulin lispro (HUMALOG KWIKPEN) 100 UNIT/ML KiwkPen Inject 0.08-0.14 mLs (8-14 Units total) into the skin 3 (three) times daily. 5 pen 2  . Lancet Devices (ACCU-CHEK SOFTCLIX) lancets Use as instructed 1 each 0  .  Lancets MISC Test 4 x daily. E11.65. Accu-Chek Aviva 200 each 5  . losartan (COZAAR) 100 MG tablet Take 1 tablet (100 mg total) by mouth daily. 30 tablet 12  . metFORMIN (GLUCOPHAGE) 500 MG tablet TAKE 1 TABLET BY MOUTH TWICE DAILY WITH A MEAL 60 tablet 2  . nitroGLYCERIN (NITROSTAT) 0.4 MG SL tablet Place 1 tablet (0.4 mg total) under the tongue every 5 (five) minutes x 3 doses as needed for chest pain. 25 tablet 2  . HYDROcodone-acetaminophen (NORCO/VICODIN) 5-325 MG tablet One tablet by mouth every six hours as needed for pain.  Seven day limit per Medicaid guidelines. (Patient not taking: Reported on 05/10/2017) 28 tablet 0   . INVOKANA 100 MG TABS tablet TAKE 1 TABLET BY MOUTH DAILY BEFORE BREAKFAST. (Patient not taking: Reported on 05/10/2017) 30 tablet 2  . isosorbide mononitrate (IMDUR) 30 MG 24 hr tablet Take 1 tablet (30 mg total) by mouth daily. 90 tablet 3   No current facility-administered medications for this encounter.     Physical Findings:  Vitals:   05/10/17 1401  BP: (!) 157/81  Pulse: 66  Temp: 97.8 F (36.6 C)   The patient is in no acute distress. Patient is alert and oriented.  HEENT: Glasses removed for exam. PERRLA. EOMI.  Oropharynx is clear. Musculoskeletal: Strength is symmetric in arms, a little bit diminished in her left hand. Hip flexion strength is a little bit diminished in the left hip and in left lower leg. Neuro: focal strength deficits as above. She has a very slight droop of the right upper lip.  Lab Findings: Lab Results  Component Value Date   WBC 6.8 01/17/2017   HGB 12.3 01/17/2017   HCT 38.8 01/17/2017   MCV 88.2 01/17/2017   PLT 314 01/17/2017      Chemistry      Component Value Date/Time   NA 138 01/17/2017 1139   K 3.9 01/17/2017 1139   CL 105 01/17/2017 1139   CO2 27 01/17/2017 1139   BUN 12 01/17/2017 1139   BUN 9.5 04/24/2015 0937   CREATININE 1.26 (H) 01/17/2017 1139   CREATININE 1.0 04/24/2015 0937      Component Value Date/Time   CALCIUM 9.3 01/17/2017 1139   ALKPHOS 83 01/17/2017 1139   AST 40 01/17/2017 1139   ALT 27 01/17/2017 1139   BILITOT 0.3 01/17/2017 1139      Radiographic Findings: Brain MRI W WO Contrast 05/04/17 IMPRESSION: Meningioma along the posterior clivus to the right of midline is unchanged. There is mass-effect on the basilar and pons which is unchanged. Chronic microvascular ischemic changes are stable.  MRI BRAIN REPORT 04/30/16 IMPRESSION: Stable examination. Meningioma of the clivus measuring 23 x 20 x 12 mm. Partial encasement of the basilar artery and slight mass-effect upon the ventral pons. Chronic small  vessel ischemic changes throughout the brain without evidence of recent insult.   Impression:  Good response to [facility] treatment. No evidence of progressive disease. MRI shows radiographic stability. No new concerns on MRI.   Plan:  The patient would like a referral back to neurology.She would like to see a new neurologist and is open to seeing one in [place]. I advised the patient that I would not refill her hydrocodone. She should discuss pain management with neurology.  I offered to refer the patient to PT for her gait instability and decreased strength. She is in agreement, and I will refer her to these services at [facility].   MRI in 12  months, then follow-up with me.  I spent 15 minutes face to face with the patient and more than 50% of that time was spent in counseling and/or coordination of care.  ------------------------------------------------------------------------  Eppie Gibson, MD  This document serves as a record of services personally performed by Eppie Gibson, MD. It was created on her behalf by Maryla Morrow, a trained medical scribe. The creation of this record is based on the scribe's personal observations and the provider's statements to them. This document has been checked and approved by the attending provider.

## 2017-05-12 ENCOUNTER — Telehealth: Payer: Self-pay | Admitting: *Deleted

## 2017-05-12 NOTE — Telephone Encounter (Signed)
CALLED PATIENT TO INFORM OF PT APPT. ON 05-19-17 @ 1 PM @ [facility], SPOKE WITH PATIENT AND SHE IS AWARE OF THIS APPT.

## 2017-05-12 NOTE — Telephone Encounter (Signed)
XXXXX

## 2017-05-31 ENCOUNTER — Telehealth: Payer: Self-pay | Admitting: "Endocrinology

## 2017-05-31 NOTE — Telephone Encounter (Signed)
Gave pt Insulin instructions. Attempted to make appt for pt. She refused.

## 2017-05-31 NOTE — Telephone Encounter (Signed)
Christine Harvey is calling stating that she needs new insulin instructions she is confused about dosage, please advise?

## 2017-07-06 ENCOUNTER — Encounter: Payer: Self-pay | Admitting: Neurology

## 2017-08-29 ENCOUNTER — Other Ambulatory Visit: Payer: Self-pay | Admitting: Adult Health

## 2017-08-29 ENCOUNTER — Other Ambulatory Visit: Payer: Self-pay | Admitting: "Endocrinology

## 2017-09-06 ENCOUNTER — Other Ambulatory Visit: Payer: Self-pay | Admitting: "Endocrinology

## 2017-09-06 ENCOUNTER — Other Ambulatory Visit: Payer: Self-pay | Admitting: Adult Health

## 2017-10-14 ENCOUNTER — Ambulatory Visit: Payer: Medicaid Other | Admitting: Neurology

## 2017-10-26 ENCOUNTER — Other Ambulatory Visit: Payer: Self-pay | Admitting: "Endocrinology

## 2017-12-22 ENCOUNTER — Other Ambulatory Visit: Payer: Self-pay | Admitting: "Endocrinology

## 2018-02-08 ENCOUNTER — Other Ambulatory Visit: Payer: Self-pay | Admitting: Radiation Therapy

## 2018-02-08 DIAGNOSIS — D329 Benign neoplasm of meninges, unspecified: Secondary | ICD-10-CM

## 2018-04-26 ENCOUNTER — Other Ambulatory Visit: Payer: Self-pay | Admitting: "Endocrinology

## 2018-05-03 ENCOUNTER — Ambulatory Visit
Admission: RE | Admit: 2018-05-03 | Discharge: 2018-05-03 | Disposition: A | Discharge status: Home / Self Care | Payer: Medicaid Other | Source: Ambulatory Visit | Attending: Radiation Oncology | Admitting: Radiation Oncology

## 2018-05-03 DIAGNOSIS — D329 Benign neoplasm of meninges, unspecified: Secondary | ICD-10-CM

## 2018-05-03 MED ORDER — GADOBENATE DIMEGLUMINE 529 MG/ML IV SOLN
15.0000 mL | Freq: Once | INTRAVENOUS | Status: AC | PRN
  Administered 2018-05-03: 15 mL via INTRAVENOUS

## 2018-05-12 ENCOUNTER — Ambulatory Visit
Admission: RE | Admit: 2018-05-12 | Discharge: 2018-05-12 | Disposition: A | Discharge status: Home / Self Care | Payer: Medicaid Other | Source: Ambulatory Visit | Attending: Radiation Oncology | Admitting: Radiation Oncology

## 2018-05-12 ENCOUNTER — Other Ambulatory Visit: Payer: Self-pay

## 2018-05-12 ENCOUNTER — Encounter: Payer: Self-pay | Admitting: Radiation Oncology

## 2018-05-12 VITALS — BP 183/78 | HR 64 | Temp 98.2°F | Resp 18 | Ht 63.0 in | Wt 173.5 lb

## 2018-05-12 DIAGNOSIS — D320 Benign neoplasm of cerebral meninges: Secondary | ICD-10-CM | POA: Insufficient documentation

## 2018-05-12 DIAGNOSIS — Z79899 Other long term (current) drug therapy: Secondary | ICD-10-CM | POA: Diagnosis not present

## 2018-05-12 DIAGNOSIS — Z794 Long term (current) use of insulin: Secondary | ICD-10-CM | POA: Insufficient documentation

## 2018-05-12 DIAGNOSIS — Z923 Personal history of irradiation: Secondary | ICD-10-CM | POA: Diagnosis not present

## 2018-05-12 DIAGNOSIS — R2681 Unsteadiness on feet: Secondary | ICD-10-CM | POA: Diagnosis not present

## 2018-05-12 DIAGNOSIS — Z7982 Long term (current) use of aspirin: Secondary | ICD-10-CM | POA: Insufficient documentation

## 2018-05-12 DIAGNOSIS — D329 Benign neoplasm of meninges, unspecified: Secondary | ICD-10-CM

## 2018-05-12 NOTE — Progress Notes (Signed)
Radiation Oncology         [phone] ________________________________  Name: Christine Harvey MRN: [id number]  Date: 05/12/2018  DOB: 1956-05-06  Follow-Up Visit Note  CC: Celedonio Savage, MD    Diagnosis:     ICD-10-CM   1. Meningioma ([facility]) D32.9    Right clivus meningioma target was treated using 12 IMRT Beams, SRS, to a prescription dose of 25 Gy at 5 Gy per fraction, last treatment on 04/30/13.   Narrative:  The patient returns today for routine follow-up of radiation therapy and review of recent brain MRI. Brain MRI on 05/03/2018 shows no change in the size of the clival meningioma since last year. Degree of mass effect upon the pons is unchanged. Partial encasement of the basilar artery is unchanged. I reviewed her imaging at our CNS tumor board.  On review of systems, the patient reports having headaches everyday. States they don't seem to have any real trigger, just "come and go all the time". Reports photosensitivity, occasional auras, and nausea. Takes OTC pain medication but states it does not improve the symptoms. Reports weakness to the left side, and about a month ago sustained a fall out of her chair to the left side. It was an unwitnessed fall, and she reports she's not sure if she passed out or how much time elapsed between the fall and waking up. When she came to, she had broken her glasses and sustained scratches to her face. Reports a weight loss and decrease in appetite but is not sure why. Other reports feeling "stable" and in good spirits. She does not currently have a neurologist.   HAs and left sided weakness are chronic in nature. No facial numbness or pain.   Wt Readings from Last 3 Encounters:  05/12/18 173 lb 8 oz (78.7 kg)  05/10/17 175 lb (79.4 kg)  03/02/17 181 lb (82.1 kg)     ALLERGIES:  is allergic to sulfa antibiotics.  Meds: Current Outpatient Medications  Medication Sig Dispense Refill  . ACCU-CHEK AVIVA PLUS test strip USE TO test blood glucose  FOUR TIMES DAILY 150 each 0  . acyclovir (ZOVIRAX) 200 MG capsule Take 200 mg by mouth as needed (for cold sores).     Marland Kitchen aspirin EC 81 MG EC tablet Take 1 tablet (81 mg total) by mouth daily.    Marland Kitchen atorvastatin (LIPITOR) 80 MG tablet Take 1 tablet (80 mg total) by mouth daily at 6 PM. 30 tablet 12  . Blood Glucose Monitoring Suppl (ACCU-CHEK AVIVA) device Use as instructed 1 each 0  . calcipotriene-betamethasone (TACLONEX SCALP) external suspension Apply topically.    . carvedilol (COREG) 25 MG tablet TAKE 1 TABLET BY MOUTH TWICE DAILY WITH A MEAL.  12  . clobetasol (TEMOVATE) 0.05 % external solution Apply to affected areas on scalp once daily. Never to face.    . Clobetasol Propionate (CLOBEX) 0.05 % shampoo APPLY TO SCALP ONCE DAILY IN SHOWER, THEN RINSE.    Marland Kitchen clopidogrel (PLAVIX) 75 MG tablet Take 1 tablet (75 mg total) by mouth daily with breakfast. 30 tablet 12  . FLUOCINOLONE ACETONIDE SCALP 0.01 % OIL APPLY TO SCALP EVERY DAY AS DIRECTED    . glucose blood (ACCU-CHEK AVIVA) test strip Test glucose 4 times a day E11.65 150 each 3  . glucose blood (ACCU-CHEK AVIVA) test strip Use to test blood glucose 4 times a day. 150 each 3  . HYDROcodone-acetaminophen (NORCO/VICODIN) 5-325 MG tablet One tablet by mouth every six hours as  needed for pain.  Seven day limit per Medicaid guidelines. (Patient not taking: Reported on 05/10/2017) 28 tablet 0  . insulin aspart (NOVOLOG FLEXPEN) 100 UNIT/ML FlexPen Inject 8-16 Units into the skin 3 (three) times daily with meals. 15 mL 2  . Insulin Glargine (LANTUS SOLOSTAR) 100 UNIT/ML Solostar Pen Inject 50 Units into the skin at bedtime.    . insulin lispro (HUMALOG KWIKPEN) 100 UNIT/ML KiwkPen Inject 0.08-0.14 mLs (8-14 Units total) into the skin 3 (three) times daily. 5 pen 2  . INVOKANA 100 MG TABS tablet TAKE 1 TABLET BY MOUTH DAILY BEFORE BREAKFAST. (Patient not taking: Reported on 05/10/2017) 30 tablet 2  . isosorbide mononitrate (IMDUR) 30 MG 24 hr tablet  Take 1 tablet (30 mg total) by mouth daily. 90 tablet 3  . Lancet Devices (ACCU-CHEK SOFTCLIX) lancets Use as instructed 1 each 0  . Lancets MISC Test 4 x daily. E11.65. Accu-Chek Aviva 200 each 5  . losartan (COZAAR) 100 MG tablet Take 1 tablet (100 mg total) by mouth daily. 30 tablet 12  . metFORMIN (GLUCOPHAGE) 500 MG tablet TAKE 1 TABLET BY MOUTH TWICE DAILY WITH A MEAL 60 tablet 2  . nitroGLYCERIN (NITROSTAT) 0.4 MG SL tablet Place 1 tablet (0.4 mg total) under the tongue every 5 (five) minutes x 3 doses as needed for chest pain. 25 tablet 2   No current facility-administered medications for this encounter.     Physical Findings:  Vitals:   05/12/18 1007  BP: (!) 183/78  Pulse: 64  Resp: 18  Temp: 98.2 F (36.8 C)  SpO2: 100%   The patient is in no acute distress. Patient is alert and oriented.  HEENT: Glasses removed for exam. PERRLA. EOMI.  Oropharynx is clear. Neck: Neck is supple, no palpable cervical or supraclavicular lymphadenopathy. Musculoskeletal: Gait is steady and straight. Strength is symmetric in arms, a little bit diminished in her left hand. Hip flexion strength is a little bit diminished in the left hip and in left lower leg. Rapidly alternating movements are intact. Neuro: focal strength deficits as above. Still has a  droop of the right upper lip. Sensation intact in face   Lab Findings: Lab Results  Component Value Date   WBC 6.8 01/17/2017   HGB 12.3 01/17/2017   HCT 38.8 01/17/2017   MCV 88.2 01/17/2017   PLT 314 01/17/2017      Chemistry      Component Value Date/Time   NA 138 01/17/2017 1139   K 3.9 01/17/2017 1139   CL 105 01/17/2017 1139   CO2 27 01/17/2017 1139   BUN 12 01/17/2017 1139   BUN 9.5 04/24/2015 0937   CREATININE 1.26 (H) 01/17/2017 1139   CREATININE 1.0 04/24/2015 0937      Component Value Date/Time   CALCIUM 9.3 01/17/2017 1139   ALKPHOS 83 01/17/2017 1139   AST 40 01/17/2017 1139   ALT 27 01/17/2017 1139   BILITOT 0.3  01/17/2017 1139      Radiographic Findings: MRI BRAIN REPORT 05/03/2018 IMPRESSION: No change in the size of the clival meningioma since last year. Measurements are 2.4 x 1.9 x 1.2 cm. Degree of mass effect upon the pons is unchanged. Partial encasement of the basilar artery is unchanged. Chronic small-vessel ischemic changes of the cerebral hemispheric white matter appear stable. Chronic small-vessel ischemic changes of the pons appear progressive, and could in part relate to previous Radiation.    Impression:  Good response to [facility] treatment. No evidence of progressive disease.  MRI shows radiographic stability. No new concerns on MRI.   Plan:  The patient will be referred to Dr. Mickeal Skinner for ongoing follow-up care and neurologic issues. She will follow-up in radiation oncology as needed. She has been discussed in CNS tumor board. She is pleased with this plan.  I spent 15 minutes face to face with the patient and more than 50% of that time was spent in counseling and/or coordination of care.  ------------------------------------------------------------------------  Eppie Gibson, MD   This document serves as a record of services personally performed by Eppie Gibson, MD. It was created on her behalf by Arlyce Harman, a trained medical scribe. The creation of this record is based on the scribe's personal observations and the provider's statements to them. This document has been checked and approved by the attending provider.

## 2018-05-12 NOTE — Progress Notes (Signed)
Christine Harvey presents for follow up of radiation completed 04/30/13 to a right clivus meningioma target. She had a MRI Brain on 05/03/18 and is here for the results today.  Reports having headaches everyday. States they don't seem to have any real trigger, just "come and go all the time". Reports photosensitivity, occasional auras, and nausea. Takes OTC pain medication but states it does not improve the symptoms. Reports weakness to the left side, and about a month ago sustained a fall out of her chair to the left side. It was an unwitnessed fall, and she reports she's not sure if she passed out or how much time elapsed between the fall and waking up. When she came to, she had broken her glasses and sustained scratches to her face. Reports a weigh tloss and decrease in appetite but is not sure why. Otherwise reports feeling "stable" and in good spirits.

## 2018-05-16 ENCOUNTER — Encounter: Payer: Self-pay | Admitting: Radiation Oncology

## 2018-06-09 ENCOUNTER — Telehealth: Payer: Self-pay

## 2018-06-09 ENCOUNTER — Other Ambulatory Visit: Payer: Self-pay

## 2018-06-09 ENCOUNTER — Inpatient Hospital Stay: Payer: Medicaid Other | Attending: Internal Medicine | Admitting: Internal Medicine

## 2018-06-09 ENCOUNTER — Encounter: Payer: Self-pay | Admitting: Internal Medicine

## 2018-06-09 VITALS — BP 126/94 | HR 73 | Temp 97.8°F | Resp 18 | Ht 63.0 in | Wt 170.5 lb

## 2018-06-09 DIAGNOSIS — Z794 Long term (current) use of insulin: Secondary | ICD-10-CM | POA: Insufficient documentation

## 2018-06-09 DIAGNOSIS — Z86011 Personal history of benign neoplasm of the brain: Secondary | ICD-10-CM | POA: Diagnosis not present

## 2018-06-09 DIAGNOSIS — D329 Benign neoplasm of meninges, unspecified: Secondary | ICD-10-CM

## 2018-06-09 DIAGNOSIS — R519 Headache, unspecified: Secondary | ICD-10-CM | POA: Insufficient documentation

## 2018-06-09 DIAGNOSIS — R5383 Other fatigue: Secondary | ICD-10-CM

## 2018-06-09 DIAGNOSIS — G8194 Hemiplegia, unspecified affecting left nondominant side: Secondary | ICD-10-CM

## 2018-06-09 DIAGNOSIS — F17210 Nicotine dependence, cigarettes, uncomplicated: Secondary | ICD-10-CM | POA: Insufficient documentation

## 2018-06-09 DIAGNOSIS — G44221 Chronic tension-type headache, intractable: Secondary | ICD-10-CM | POA: Diagnosis not present

## 2018-06-09 DIAGNOSIS — R531 Weakness: Secondary | ICD-10-CM | POA: Insufficient documentation

## 2018-06-09 DIAGNOSIS — Z85048 Personal history of other malignant neoplasm of rectum, rectosigmoid junction, and anus: Secondary | ICD-10-CM | POA: Diagnosis not present

## 2018-06-09 DIAGNOSIS — Z7982 Long term (current) use of aspirin: Secondary | ICD-10-CM | POA: Insufficient documentation

## 2018-06-09 DIAGNOSIS — R51 Headache: Secondary | ICD-10-CM

## 2018-06-09 DIAGNOSIS — G8929 Other chronic pain: Secondary | ICD-10-CM | POA: Insufficient documentation

## 2018-06-09 DIAGNOSIS — Z79899 Other long term (current) drug therapy: Secondary | ICD-10-CM

## 2018-06-09 DIAGNOSIS — E119 Type 2 diabetes mellitus without complications: Secondary | ICD-10-CM

## 2018-06-09 DIAGNOSIS — R400 Somnolence: Secondary | ICD-10-CM

## 2018-06-09 MED ORDER — AMITRIPTYLINE HCL 50 MG PO TABS: 50.0000 mg | ORAL_TABLET | Freq: Every day | ORAL | 3 refills | Status: DC

## 2018-06-09 NOTE — Progress Notes (Signed)
[facility] at [place], [place] [address] [phone]   New Patient Evaluation  Date of Service: 06/09/18 Patient Name: Christine Harvey Patient MRN: [id number] Patient DOB: 10/19/1956 Provider: Ventura Sellers, MD  Identifying Statement:  Christine Harvey is a 62 y.o. 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    Referring Provider: Celedonio Savage, MD [place], [place] [id number]  Oncologic History: 2014: Completes RT with Dr. Isidore Moos  History of Present Illness: The patient's records from the referring physician were obtained and reviewed and the patient interviewed to confirm this HPI.  Christine Harvey presents to clinic to review recent neurologic complaints thought to be related to her meningioma.  She describes daily, almost persistent headaches, with a pounding sensation.  Severity and location on the head varies, but there is no migration and no associated light or noise sensitivity.  She does not get nausea with the headaches.  For symptoms she takes 800mg  Ibuprofen usually 3-4 times per day.  This has been her regimen for a "long time".  She also describes poor quality sleep and excessive drowsiness during the daytime with frequent naps.  Eye exam is up to date and caffeine and alcohol intake are minimal.    In addition, Christine Harvey has chronic left sided weakness over the past 10 years.  She is able to ambulate independently but needs a walker for long distances or for fatigue.  Her left hand is also somewhat weak and clumsy compared to the right.  This has been static at best and slowly progressive at worst over the past years.  Medications: Current Outpatient Medications on File Prior to Visit  Medication Sig Dispense Refill  . ACCU-CHEK AVIVA PLUS test strip USE TO test blood glucose FOUR TIMES DAILY 150 each 0  . acyclovir (ZOVIRAX) 200 MG capsule Take 200 mg by mouth as  needed (for cold sores).     Marland Kitchen aspirin EC 81 MG EC tablet Take 1 tablet (81 mg total) by mouth daily.    Marland Kitchen atorvastatin (LIPITOR) 80 MG tablet Take 1 tablet (80 mg total) by mouth daily at 6 PM. 30 tablet 12  . Blood Glucose Monitoring Suppl (ACCU-CHEK AVIVA) device Use as instructed 1 each 0  . calcipotriene-betamethasone (TACLONEX SCALP) external suspension Apply topically.    . carvedilol (COREG) 25 MG tablet TAKE 1 TABLET BY MOUTH TWICE DAILY WITH A MEAL.  12  . clobetasol (TEMOVATE) 0.05 % external solution Apply to affected areas on scalp once daily. Never to face.    . Clobetasol Propionate (CLOBEX) 0.05 % shampoo APPLY TO SCALP ONCE DAILY IN SHOWER, THEN RINSE.    Marland Kitchen clopidogrel (PLAVIX) 75 MG tablet Take 1 tablet (75 mg total) by mouth daily with breakfast. 30 tablet 12  . FLUOCINOLONE ACETONIDE SCALP 0.01 % OIL APPLY TO SCALP EVERY DAY AS DIRECTED    . glucose blood (ACCU-CHEK AVIVA) test strip Test glucose 4 times a day E11.65 150 each 3  . glucose blood (ACCU-CHEK AVIVA) test strip Use to test blood glucose 4 times a day. 150 each 3  . Insulin Glargine (LANTUS SOLOSTAR) 100 UNIT/ML Solostar Pen Inject 50 Units into the skin at bedtime.    . insulin lispro (HUMALOG KWIKPEN) 100 UNIT/ML KiwkPen Inject 0.08-0.14 mLs (8-14 Units total) into the skin 3 (three) times daily. 5 pen 2  . INVOKANA 100 MG TABS tablet TAKE 1  TABLET BY MOUTH DAILY BEFORE BREAKFAST. 30 tablet 2  . Lancet Devices (ACCU-CHEK SOFTCLIX) lancets Use as instructed 1 each 0  . Lancets MISC Test 4 x daily. E11.65. Accu-Chek Aviva 200 each 5  . losartan (COZAAR) 100 MG tablet Take 1 tablet (100 mg total) by mouth daily. 30 tablet 12  . metFORMIN (GLUCOPHAGE) 500 MG tablet TAKE 1 TABLET BY MOUTH TWICE DAILY WITH A MEAL 60 tablet 2  . HYDROcodone-acetaminophen (NORCO/VICODIN) 5-325 MG tablet One tablet by mouth every six hours as needed for pain.  Seven day limit per Medicaid guidelines. (Patient not taking: Reported on  05/10/2017) 28 tablet 0  . insulin aspart (NOVOLOG FLEXPEN) 100 UNIT/ML FlexPen Inject 8-16 Units into the skin 3 (three) times daily with meals. (Patient not taking: Reported on 06/09/2018) 15 mL 2  . isosorbide mononitrate (IMDUR) 30 MG 24 hr tablet Take 1 tablet (30 mg total) by mouth daily. 90 tablet 3  . nitroGLYCERIN (NITROSTAT) 0.4 MG SL tablet Place 1 tablet (0.4 mg total) under the tongue every 5 (five) minutes x 3 doses as needed for chest pain. (Patient not taking: Reported on 06/09/2018) 25 tablet 2   No current facility-administered medications on file prior to visit.     Allergies:  Allergies  Allergen Reactions  . Sulfa Antibiotics Other (See Comments)    Passes out   Past Medical History:  Past Medical History:  Diagnosis Date  . Brain tumor (benign) ([facility]) 7 years   Meningioma  along the Clivis - 2.5 x 1.2 x 1.9 cm .  some iInvolvement long the Clival bone and Effect upon the Pons - s/p sterotactic radiosurgery in 2014.  Marland Kitchen CAD (coronary artery disease)    80-99% first diagonal (unable to be intervened upon), 60% circumflex, 75% RCA August 2017  . Depression   . Essential hypertension   . Frequent headaches   . Hyperlipidemia   . Morbid obesity ([place])   . Osteoarthritis   . Photophobia    With Headaches  . S/P radiation therapy 05/02/13   25 Gy at 5 Gy per fraction, last treatment on 04/30/13  . ST elevation myocardial infarction (STEMI) of inferolateral wall [facility])    August 2017  . Type 2 diabetes mellitus ([place])    Past Surgical History:  Past Surgical History:  Procedure Laterality Date  . CARDIAC CATHETERIZATION N/A 07/31/2016   Procedure: Left Heart Cath and Coronary Angiography;  Surgeon: Lorretta Harp, MD;  Location: [facility];  Service: Cardiovascular;  Laterality: N/A;  . TONSILLECTOMY     As a child   Social History:  Social History   Socioeconomic History  . Marital status: Single    Spouse name: Not on file  . Number of children: Not on  file  . Years of education: Not on file  . Highest education level: Not on file  Occupational History  . Not on file  Social Needs  . Financial resource strain: Not on file  . Food insecurity:    Worry: Not on file    Inability: Not on file  . Transportation needs:    Medical: Not on file    Non-medical: Not on file  Tobacco Use  . Smoking status: Light Tobacco Smoker    Types: Cigarettes  . Smokeless tobacco: Never Used  . Tobacco comment: she smokes when she drinks once weekly  Substance and Sexual Activity  . Alcohol use: Yes    Comment: 2010 -  Alcohol Rehabilitation/ Drinks  Occassionally  . Drug use: No  . Sexual activity: Not Currently    Partners: Male  Lifestyle  . Physical activity:    Days per week: Not on file    Minutes per session: Not on file  . Stress: Not on file  Relationships  . Social connections:    Talks on phone: Not on file    Gets together: Not on file    Attends religious service: Not on file    Active member of club or organization: Not on file    Attends meetings of clubs or organizations: Not on file    Relationship status: Not on file  . Intimate partner violence:    Fear of current or ex partner: Not on file    Emotionally abused: Not on file    Physically abused: Not on file    Forced sexual activity: Not on file  Other Topics Concern  . Not on file  Social History Narrative  . Not on file   Family History:  Family History  Problem Relation Age of Onset  . Hypertension Mother        MI in her 76's to 59's.  . CAD Mother   . Hypertension Father   . Stroke Father   . Cancer Sister     Review of Systems: Constitutional: ++fatigue Eyes: Denies blurriness of vision Ears, nose, mouth, throat, and face: Denies mucositis or sore throat Respiratory: Denies cough, dyspnea or wheezes Cardiovascular: Denies palpitation, chest discomfort or lower extremity swelling Gastrointestinal:  Denies nausea, constipation, diarrhea GU: Denies  dysuria or incontinence Skin: Denies abnormal skin rashes Neurological: Per HPI Musculoskeletal: Denies joint pain, back or neck discomfort. No decrease in ROM Behavioral/Psych: Denies anxiety, disturbance in thought content, and mood instability  Physical Exam: Vitals:   06/09/18 1008  BP: (!) 126/94  Pulse: 73  Resp: 18  Temp: 97.8 F (36.6 C)  SpO2: 99%   KPS: 80. General: Alert, cooperative, pleasant, in no acute distress Head: Normal EENT: No conjunctival injection or scleral icterus. Oral mucosa moist Lungs: Resp effort normal Cardiac: Regular rate and rhythm Abdomen: Soft, non-distended abdomen Skin: No rashes cyanosis or petechiae. Extremities: No clubbing or edema  Neurologic Exam: Mental Status: Awake, alert, attentive to examiner. Oriented to self and environment. Language is fluent with intact comprehension.  Cranial Nerves: Visual acuity is grossly normal. Visual fields are full. Extra-ocular movements intact. No ptosis. Face is symmetric, tongue midline. Motor: 4+/5 left arm and leg with decreased bulk compared to right.  Moderate thenar atrophy on left. Reflexes are symmetric, no pathologic reflexes present. Intact finger to nose bilaterally Sensory: Intact to light touch and temperature Gait: Independent, hemiparetic   Labs: I have reviewed the data as listed    Component Value Date/Time   NA 138 01/17/2017 1139   K 3.9 01/17/2017 1139   CL 105 01/17/2017 1139   CO2 27 01/17/2017 1139   GLUCOSE 159 (H) 01/17/2017 1139   BUN 12 01/17/2017 1139   BUN 9.5 04/24/2015 0937   CREATININE 1.26 (H) 01/17/2017 1139   CREATININE 1.0 04/24/2015 0937   CALCIUM 9.3 01/17/2017 1139   PROT 7.6 01/17/2017 1139   ALBUMIN 3.3 (L) 01/17/2017 1139   AST 40 01/17/2017 1139   ALT 27 01/17/2017 1139   ALKPHOS 83 01/17/2017 1139   BILITOT 0.3 01/17/2017 1139   GFRNONAA 45 (L) 01/17/2017 1139   GFRAA 52 (L) 01/17/2017 1139   Lab Results  Component Value Date   WBC  6.8  01/17/2017   NEUTROABS 4.4 01/17/2017   HGB 12.3 01/17/2017   HCT 38.8 01/17/2017   MCV 88.2 01/17/2017   PLT 314 01/17/2017    Imaging: [facility] Clinician Interpretation: I have personally reviewed the CNS images as listed.  My interpretation, in the context of the patient's clinical presentation, is stable disease   CLINICAL DATA:  S RS brain study. History of rectal cancer and benign meningioma. Confusion. Gait disturbance. Weakness.  Creatinine was obtained on site at [facility] at [address]  Results: Creatinine 1.1 mg/dL.  EXAM: MRI HEAD WITHOUT AND WITH CONTRAST  TECHNIQUE: Multiplanar, multiecho pulse sequences of the brain and surrounding structures were obtained without and with intravenous contrast.  CONTRAST:  2mL MULTIHANCE GADOBENATE DIMEGLUMINE 529 MG/ML IV SOLN  COMPARISON:  05/04/2017.  04/30/2016.  FINDINGS: Brain: Diffusion imaging does not show any acute or subacute infarction. Chronic small-vessel ischemic changes of the cerebral hemispheric deep and subcortical white matter are similar to the previous exam. Small-vessel ischemic changes of the pons appear progressive since the previous exam. No cortical or large vessel territory infarction. Meningioma along the clivus is unchanged in size with axial dimensions of 19 x 12 mm and cephalo caudal dimension of 2.4 cm. Mass-effect upon the pons appears similar. Partial encasement of the basilar artery is similar. No second meningioma is identified. No intra-axial mass. No hydrocephalus or extra-axial collection.  Vascular: Major vessels at the base of the brain show flow.  Skull and upper cervical spine: Otherwise negative  Sinuses/Orbits: Clear/normal  Other: None  IMPRESSION: No change in the size of the clival meningioma since last year. Measurements are 2.4 x 1.9 x 1.2 cm. Degree of mass effect upon the pons is unchanged. Partial encasement of the basilar artery  is unchanged.  Chronic small-vessel ischemic changes of the cerebral hemispheric white matter appear stable. Chronic small-vessel ischemic changes of the pons appear progressive, and could in part relate to previous radiation.   Electronically Signed   By: Nelson Chimes M.D.   On: 05/03/2018 11:35   Assessment/Plan 1. Meningioma ([place])  2. Chronic tension-type headache, intractable  Christine Harvey has chronic daily headache which is likely secondary to long term medication overuse (ibuprofen).  We counseled her on recommended use of analgesia and its relationship with chronic pain.    We recommended very gradual de-escalation of analgesia.  For headache prevention we recommended she take Elavil 50mg  HS.  We discussed how brief courses of steroids could be used to help in the weaning process.  In addition, her daytime sleepiness, poor sleep quality, and chronic headaches described above could potentially be related to undiagnosed sleep apnea.    Her chronic weakness is due to tumor burden affecting corticospinal tract in the basis pontis.  The progressive nature of the weakness is due to disuse/atrophy rather than direct tumor involvement.  We recommended re-initiating physical therapy to provide attention to this deficit.    We appreciate the opportunity to participate in the care of Christine Harvey.   She should return in ~6 weeks to re-assess headache frequency and severity.  Her meningioma should be re-imaged next spring.   We spent twenty additional minutes teaching regarding the natural history, biology, and historical experience in the treatment of brain tumors. We then discussed in detail the current recommendations for therapy focusing on the mode of administration, mechanism of action, anticipated toxicities, and quality of life issues associated with this plan. We also provided teaching sheets for  the patient to take home as an additional resource.  All questions were  answered. The patient knows to call the clinic with any problems, questions or concerns. No barriers to learning were detected.  The total time spent in the encounter was 60 minutes and more than 50% was on counseling and review of test results   Ventura Sellers, MD Medical Director of Neuro-Oncology [facility] at [place] 06/09/18 2:32 PM

## 2018-06-09 NOTE — Telephone Encounter (Signed)
Printed avs and calender of upcoming appointment. Per 6/21 los Patient unable to come on that Friday instead was scheduled for that Thursday

## 2018-06-15 ENCOUNTER — Other Ambulatory Visit: Payer: Self-pay | Admitting: *Deleted

## 2018-06-15 DIAGNOSIS — Z794 Long term (current) use of insulin: Principal | ICD-10-CM

## 2018-06-15 DIAGNOSIS — E1165 Type 2 diabetes mellitus with hyperglycemia: Principal | ICD-10-CM

## 2018-06-15 DIAGNOSIS — E1122 Type 2 diabetes mellitus with diabetic chronic kidney disease: Secondary | ICD-10-CM

## 2018-06-15 DIAGNOSIS — N183 Chronic kidney disease, stage 3 (moderate): Principal | ICD-10-CM

## 2018-06-15 DIAGNOSIS — D329 Benign neoplasm of meninges, unspecified: Secondary | ICD-10-CM

## 2018-06-15 DIAGNOSIS — IMO0002 Reserved for concepts with insufficient information to code with codable children: Secondary | ICD-10-CM

## 2018-06-15 NOTE — Progress Notes (Signed)
Received call From Elsie Lincoln [phone] with [facility].  He advised that the patient is not homebound and is very difficult to schedule with in the home because she is always on the go and he would complete one week of physical therapy and then requests a referral to outpatient home health to [facility] in [place].    Faxed new referral to [facility] [phone]

## 2018-06-30 ENCOUNTER — Telehealth: Payer: Self-pay

## 2018-06-30 NOTE — Telephone Encounter (Signed)
Pt missed sleep study appt and requested to be rescheduled. In-basket sent to Shelle Iron, RN to reschedule. No note found with location and contact number for sleep study facility. LVM with pt to expect call from [facility] on Monday to reschedule, and confirmed next appt with Dr. Mickeal Skinner scheduled for 8/1/9 at 124. Pt encouraged to call back with any further questions [phone].

## 2018-07-03 ENCOUNTER — Telehealth: Payer: Self-pay | Admitting: *Deleted

## 2018-07-03 NOTE — Telephone Encounter (Signed)
Patient rescheduled for Sleep Study at [facility] 07/05/2018 @ 8:00 pm to arrive at [place].  Patient understands.

## 2018-07-04 ENCOUNTER — Telehealth: Payer: Self-pay | Admitting: Internal Medicine

## 2018-07-05 ENCOUNTER — Ambulatory Visit: Payer: Medicaid Other | Attending: Internal Medicine | Admitting: Neurology

## 2018-07-05 DIAGNOSIS — Z794 Long term (current) use of insulin: Secondary | ICD-10-CM | POA: Diagnosis not present

## 2018-07-05 DIAGNOSIS — Z7902 Long term (current) use of antithrombotics/antiplatelets: Secondary | ICD-10-CM | POA: Insufficient documentation

## 2018-07-05 DIAGNOSIS — G44221 Chronic tension-type headache, intractable: Secondary | ICD-10-CM | POA: Diagnosis present

## 2018-07-05 DIAGNOSIS — G4733 Obstructive sleep apnea (adult) (pediatric): Secondary | ICD-10-CM | POA: Diagnosis not present

## 2018-07-05 DIAGNOSIS — Z7982 Long term (current) use of aspirin: Secondary | ICD-10-CM | POA: Insufficient documentation

## 2018-07-05 DIAGNOSIS — Z79899 Other long term (current) drug therapy: Secondary | ICD-10-CM | POA: Insufficient documentation

## 2018-07-05 DIAGNOSIS — R400 Somnolence: Secondary | ICD-10-CM | POA: Diagnosis present

## 2018-07-05 DIAGNOSIS — R5383 Other fatigue: Secondary | ICD-10-CM | POA: Insufficient documentation

## 2018-07-05 DIAGNOSIS — R0683 Snoring: Secondary | ICD-10-CM | POA: Diagnosis not present

## 2018-07-18 NOTE — Procedures (Signed)
[place] A. Merlene Laughter, MD     [email or web address]             NOCTURNAL POLYSOMNOGRAPHY   LOCATION: [facility]   Patient Name: Christine Harvey, Christine Harvey Date: 07/05/2018 Gender: Female D.O.B: 08/18/1956 Age (years): 35 Referring Provider: Ventura Sellers Height (inches): 63 Interpreting Physician: Phillips Odor MD, ABSM Weight (lbs): 170 RPSGT: Peak, Robert BMI: 30 MRN: [id number] Neck Size: 14.50 <br> <br> CLINICAL INFORMATION Sleep Study Type: NPSG    Indication for sleep study: Daytime Fatigue    Epworth Sleepiness Score: 10    SLEEP STUDY TECHNIQUE As per the AASM Manual for the Scoring of Sleep and Associated Events v2.3 (April 2016) with a hypopnea requiring 4% desaturations.  The channels recorded and monitored were frontal, central and occipital EEG, electrooculogram (EOG), submentalis EMG (chin), nasal and oral airflow, thoracic and abdominal wall motion, anterior tibialis EMG, snore microphone, electrocardiogram, and pulse oximetry.  MEDICATIONS Medications self-administered by patient taken the night of the study : N/A  Current Outpatient Medications:  .  ACCU-CHEK AVIVA PLUS test strip, USE TO test blood glucose FOUR TIMES DAILY, Disp: 150 each, Rfl: 0 .  acyclovir (ZOVIRAX) 200 MG capsule, Take 200 mg by mouth as needed (for cold sores). , Disp: , Rfl:  .  amitriptyline (ELAVIL) 50 MG tablet, Take 1 tablet (50 mg total) by mouth at bedtime., Disp: 30 tablet, Rfl: 3 .  aspirin EC 81 MG EC tablet, Take 1 tablet (81 mg total) by mouth daily., Disp: , Rfl:  .  atorvastatin (LIPITOR) 80 MG tablet, Take 1 tablet (80 mg total) by mouth daily at 6 PM., Disp: 30 tablet, Rfl: 12 .  Blood Glucose Monitoring Suppl (ACCU-CHEK AVIVA) device, Use as instructed, Disp: 1 each, Rfl: 0 .  calcipotriene-betamethasone (TACLONEX SCALP) external suspension, Apply topically., Disp: , Rfl:  .  carvedilol (COREG) 25 MG tablet, TAKE 1 TABLET BY MOUTH TWICE  DAILY WITH A MEAL., Disp: , Rfl: 12 .  clobetasol (TEMOVATE) 0.05 % external solution, Apply to affected areas on scalp once daily. Never to face., Disp: , Rfl:  .  Clobetasol Propionate (CLOBEX) 0.05 % shampoo, APPLY TO SCALP ONCE DAILY IN SHOWER, THEN RINSE., Disp: , Rfl:  .  clopidogrel (PLAVIX) 75 MG tablet, Take 1 tablet (75 mg total) by mouth daily with breakfast., Disp: 30 tablet, Rfl: 12 .  FLUOCINOLONE ACETONIDE SCALP 0.01 % OIL, APPLY TO SCALP EVERY DAY AS DIRECTED, Disp: , Rfl:  .  glucose blood (ACCU-CHEK AVIVA) test strip, Test glucose 4 times a day E11.65, Disp: 150 each, Rfl: 3 .  glucose blood (ACCU-CHEK AVIVA) test strip, Use to test blood glucose 4 times a day., Disp: 150 each, Rfl: 3 .  HYDROcodone-acetaminophen (NORCO/VICODIN) 5-325 MG tablet, One tablet by mouth every six hours as needed for pain.  Seven day limit per Medicaid guidelines. (Patient not taking: Reported on 05/10/2017), Disp: 28 tablet, Rfl: 0 .  insulin aspart (NOVOLOG FLEXPEN) 100 UNIT/ML FlexPen, Inject 8-16 Units into the skin 3 (three) times daily with meals. (Patient not taking: Reported on 06/09/2018), Disp: 15 mL, Rfl: 2 .  Insulin Glargine (LANTUS SOLOSTAR) 100 UNIT/ML Solostar Pen, Inject 50 Units into the skin at bedtime., Disp: , Rfl:  .  insulin lispro (HUMALOG KWIKPEN) 100 UNIT/ML KiwkPen, Inject 0.08-0.14 mLs (8-14 Units total) into the skin 3 (three) times daily., Disp: 5 pen, Rfl: 2 .  INVOKANA 100 MG TABS tablet, TAKE 1 TABLET BY MOUTH DAILY BEFORE BREAKFAST.,  Disp: 30 tablet, Rfl: 2 .  isosorbide mononitrate (IMDUR) 30 MG 24 hr tablet, Take 1 tablet (30 mg total) by mouth daily., Disp: 90 tablet, Rfl: 3 .  Lancet Devices (ACCU-CHEK SOFTCLIX) lancets, Use as instructed, Disp: 1 each, Rfl: 0 .  Lancets MISC, Test 4 x daily. E11.65. Accu-Chek Aviva, Disp: 200 each, Rfl: 5 .  losartan (COZAAR) 100 MG tablet, Take 1 tablet (100 mg total) by mouth daily., Disp: 30 tablet, Rfl: 12 .  metFORMIN (GLUCOPHAGE)  500 MG tablet, TAKE 1 TABLET BY MOUTH TWICE DAILY WITH A MEAL, Disp: 60 tablet, Rfl: 2 .  nitroGLYCERIN (NITROSTAT) 0.4 MG SL tablet, Place 1 tablet (0.4 mg total) under the tongue every 5 (five) minutes x 3 doses as needed for chest pain. (Patient not taking: Reported on 06/09/2018), Disp: 25 tablet, Rfl: 2    SLEEP ARCHITECTURE The study was initiated at 9:09:06 PM and ended at 4:21:13 AM.  Sleep onset time was 11.9 minutes and the sleep efficiency was 70.6%%. The total sleep time was 305 minutes.  Stage REM latency was 176.0 minutes.  The patient spent 14.1%% of the night in stage N1 sleep, 67.0%% in stage N2 sleep, 0.3%% in stage N3 and 18.5% in REM.  Alpha intrusion was absent.  Supine sleep was 2.46%.  RESPIRATORY PARAMETERS The overall apnea/hypopnea index (AHI) was 7.7 per hour. There were 3 total apneas, including 1 obstructive, 2 central and 0 mixed apneas. There were 36 hypopneas and 55 RERAs.  The AHI during Stage REM sleep was 31.9 per hour.  AHI while supine was 0.0 per hour.  The mean oxygen saturation was 95.1%. The minimum SpO2 during sleep was 87.0%.  soft snoring was noted during this study.  CARDIAC DATA The 2 lead EKG demonstrated sinus rhythm. The mean heart rate was 86.6 beats per minute. Other EKG findings include: None. LEG MOVEMENT DATA The total PLMS were 0 with a resulting PLMS index of 0.0. Associated arousal with leg movement index was 0.0.  IMPRESSIONS - Mild obstructive sleep apnea is documented with this study. However, the degree of severity does not require a positive pressure treatment. - Reduced slow wave sleep is also documented.   Delano Metz, MD Diplomate, American Board of Sleep Medicine.  ELECTRONICALLY SIGNED ON:  07/18/2018, 8:49 AM [place] PH: [phone]   FX: [phone] [place]

## 2018-07-20 ENCOUNTER — Encounter: Payer: Self-pay | Admitting: Internal Medicine

## 2018-07-20 ENCOUNTER — Telehealth: Payer: Self-pay

## 2018-07-20 ENCOUNTER — Inpatient Hospital Stay: Payer: Medicaid Other | Attending: Internal Medicine | Admitting: Internal Medicine

## 2018-07-20 VITALS — BP 169/83 | HR 63 | Temp 97.8°F | Resp 18 | Ht 63.0 in | Wt 176.3 lb

## 2018-07-20 DIAGNOSIS — R5383 Other fatigue: Secondary | ICD-10-CM | POA: Insufficient documentation

## 2018-07-20 DIAGNOSIS — D329 Benign neoplasm of meninges, unspecified: Secondary | ICD-10-CM | POA: Diagnosis present

## 2018-07-20 DIAGNOSIS — Z8249 Family history of ischemic heart disease and other diseases of the circulatory system: Secondary | ICD-10-CM | POA: Insufficient documentation

## 2018-07-20 DIAGNOSIS — Z794 Long term (current) use of insulin: Secondary | ICD-10-CM | POA: Insufficient documentation

## 2018-07-20 DIAGNOSIS — E119 Type 2 diabetes mellitus without complications: Secondary | ICD-10-CM | POA: Insufficient documentation

## 2018-07-20 DIAGNOSIS — G44221 Chronic tension-type headache, intractable: Secondary | ICD-10-CM | POA: Insufficient documentation

## 2018-07-20 DIAGNOSIS — Z7982 Long term (current) use of aspirin: Secondary | ICD-10-CM

## 2018-07-20 DIAGNOSIS — Z79899 Other long term (current) drug therapy: Secondary | ICD-10-CM | POA: Insufficient documentation

## 2018-07-20 DIAGNOSIS — F17210 Nicotine dependence, cigarettes, uncomplicated: Secondary | ICD-10-CM | POA: Diagnosis not present

## 2018-07-20 DIAGNOSIS — R531 Weakness: Secondary | ICD-10-CM

## 2018-07-20 NOTE — Progress Notes (Signed)
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 [place], [place] [address]  Oncologic History: 2014: Completes RT with Dr. Isidore Harvey  Interval History:  Christine Harvey presents today for headache follow up.  She continues to have near daily headache events of the same semiology.  She "cut out" the ibuprofen but is taking Tylenol 4-5x per week.  Did obtain the sleep study without issue.  Otherwise no new complaints, minimizing caffeine intake and sleeping "ok".  Did not start taking Elavil yet.  06/09/18: She describes daily, almost persistent headaches, with a pounding sensation.  Severity and location on the head varies, but there is no migration and no associated light or noise sensitivity.  She does not get nausea with the headaches.  For symptoms she takes 800mg  Ibuprofen usually 3-4 times per day.  This has been her regimen for a "long time".  She also describes poor quality sleep and excessive drowsiness during the daytime with frequent naps.  Eye exam is up to date and caffeine and alcohol intake are minimal.    In addition, [name] has chronic left sided weakness over the past 10 years.  She is able to ambulate independently but needs a walker for long distances or for fatigue.  Her left hand is also somewhat weak and clumsy compared to the right.  This has been static at best and slowly progressive at worst over the past years.  Medications: Current Outpatient Medications on File Prior to Visit  Medication Sig Dispense Refill  . ACCU-CHEK AVIVA PLUS test strip USE TO test blood glucose FOUR TIMES DAILY 150 each 0  . acyclovir  (ZOVIRAX) 200 MG capsule Take 200 mg by mouth as needed (for cold sores).     Marland Kitchen aspirin EC 81 MG EC tablet Take 1 tablet (81 mg total) by mouth daily.    Marland Kitchen atorvastatin (LIPITOR) 80 MG tablet Take 1 tablet (80 mg total) by mouth daily at 6 PM. 30 tablet 12  . Blood Glucose Monitoring Suppl (ACCU-CHEK AVIVA) device Use as instructed 1 each 0  . calcipotriene-betamethasone (TACLONEX SCALP) external suspension Apply topically.    . carvedilol (COREG) 25 MG tablet TAKE 1 TABLET BY MOUTH TWICE DAILY WITH A MEAL.  12  . clobetasol (TEMOVATE) 0.05 % external solution Apply to affected areas on scalp once daily. Never to face.    . Clobetasol Propionate (CLOBEX) 0.05 % shampoo APPLY TO SCALP ONCE DAILY IN SHOWER, THEN RINSE.    Marland Kitchen clopidogrel (PLAVIX) 75 MG tablet Take 1 tablet (75 mg total) by mouth daily with breakfast. 30 tablet 12  . FLUOCINOLONE ACETONIDE SCALP 0.01 % OIL APPLY TO SCALP EVERY DAY AS DIRECTED    . glucose blood (ACCU-CHEK AVIVA) test strip Test glucose 4 times a day E11.65 150 each 3  . glucose blood (ACCU-CHEK AVIVA) test strip Use to test blood glucose 4 times a day. 150 each 3  . HYDROcodone-acetaminophen (NORCO/VICODIN) 5-325 MG tablet One tablet by mouth every six hours as needed for pain.  Seven day limit per Medicaid guidelines. 28 tablet 0  . insulin aspart (NOVOLOG FLEXPEN) 100 UNIT/ML FlexPen Inject 8-16 Units  into the skin 3 (three) times daily with meals. 15 mL 2  . Insulin Glargine (LANTUS SOLOSTAR) 100 UNIT/ML Solostar Pen Inject 50 Units into the skin at bedtime.    . insulin lispro (HUMALOG KWIKPEN) 100 UNIT/ML KiwkPen Inject 0.08-0.14 mLs (8-14 Units total) into the skin 3 (three) times daily. 5 pen 2  . INVOKANA 100 MG TABS tablet TAKE 1 TABLET BY MOUTH DAILY BEFORE BREAKFAST. 30 tablet 2  . Lancet Devices (ACCU-CHEK SOFTCLIX) lancets Use as instructed 1 each 0  . Lancets MISC Test 4 x daily. E11.65. Accu-Chek Aviva 200 each 5  . losartan (COZAAR) 100 MG tablet Take 1  tablet (100 mg total) by mouth daily. 30 tablet 12  . metFORMIN (GLUCOPHAGE) 500 MG tablet TAKE 1 TABLET BY MOUTH TWICE DAILY WITH A MEAL 60 tablet 2  . nitroGLYCERIN (NITROSTAT) 0.4 MG SL tablet Place 1 tablet (0.4 mg total) under the tongue every 5 (five) minutes x 3 doses as needed for chest pain. 25 tablet 2  . amitriptyline (ELAVIL) 50 MG tablet Take 1 tablet (50 mg total) by mouth at bedtime. (Patient not taking: Reported on 07/20/2018) 30 tablet 3  . isosorbide mononitrate (IMDUR) 30 MG 24 hr tablet Take 1 tablet (30 mg total) by mouth daily. 90 tablet 3   No current facility-administered medications on file prior to visit.     Allergies:  Allergies  Allergen Reactions  . Sulfa Antibiotics Other (See Comments)    Passes out   Past Medical History:  Past Medical History:  Diagnosis Date  . Brain tumor (benign) (Christine Harvey) 7 years   Meningioma  along the Clivis - 2.5 x 1.2 x 1.9 cm .  some iInvolvement long the Clival bone and Effect upon the Pons - s/p sterotactic radiosurgery in 2014.  Marland Kitchen CAD (coronary artery disease)    80-99% first diagonal (unable to be intervened upon), 60% circumflex, 75% RCA August 2017  . Depression   . Essential hypertension   . Frequent headaches   . Hyperlipidemia   . Morbid obesity ([place])   . Osteoarthritis   . Photophobia    With Headaches  . S/P radiation therapy 05/02/13   25 Gy at 5 Gy per fraction, last treatment on 04/30/13  . ST elevation myocardial infarction (STEMI) of inferolateral wall Christine Harvey)    August 2017  . Type 2 diabetes mellitus ([place])    Past Surgical History:  Past Surgical History:  Procedure Laterality Date  . CARDIAC CATHETERIZATION N/A 07/31/2016   Procedure: Left Heart Cath and Coronary Angiography;  Surgeon: Christine Harp, MD;  Location: [facility];  Service: Cardiovascular;  Laterality: N/A;  . TONSILLECTOMY     As a child   Social History:  Social History   Socioeconomic History  . Marital status: Single     Spouse name: Not on file  . Number of children: Not on file  . Years of education: Not on file  . Highest education level: Not on file  Occupational History  . Not on file  Social Needs  . Financial resource strain: Not on file  . Food insecurity:    Worry: Not on file    Inability: Not on file  . Transportation needs:    Medical: Not on file    Non-medical: Not on file  Tobacco Use  . Smoking status: Light Tobacco Smoker    Types: Cigarettes  . Smokeless tobacco: Never Used  . Tobacco comment: she smokes when she drinks  once weekly  Substance and Sexual Activity  . Alcohol use: Yes    Comment: 2010 -  Alcohol Rehabilitation/ Drinks Occassionally  . Drug use: No  . Sexual activity: Not Currently    Partners: Male  Lifestyle  . Physical activity:    Days per week: Not on file    Minutes per session: Not on file  . Stress: Not on file  Relationships  . Social connections:    Talks on phone: Not on file    Gets together: Not on file    Attends religious service: Not on file    Active member of club or organization: Not on file    Attends meetings of clubs or organizations: Not on file    Relationship status: Not on file  . Intimate partner violence:    Fear of current or ex partner: Not on file    Emotionally abused: Not on file    Physically abused: Not on file    Forced sexual activity: Not on file  Other Topics Concern  . Not on file  Social History Narrative  . Not on file   Family History:  Family History  Problem Relation Age of Onset  . Hypertension Mother        MI in her 19's to 20's.  . CAD Mother   . Hypertension Father   . Stroke Father   . Cancer Sister     Review of Systems: Constitutional: +fatigue Eyes: Denies blurriness of vision Ears, nose, mouth, throat, and face: Denies mucositis or sore throat Respiratory: Denies cough, dyspnea or wheezes Cardiovascular: Denies palpitation, chest discomfort or lower extremity  swelling Gastrointestinal:  Denies nausea, constipation, diarrhea GU: Denies dysuria or incontinence Skin: Denies abnormal skin rashes Neurological: Per HPI Musculoskeletal: Denies joint pain, back or neck discomfort. No decrease in ROM Behavioral/Psych: Denies anxiety, disturbance in thought content, and mood instability  Physical Exam: Vitals:   07/20/18 1245  BP: (!) 169/83  Pulse: 63  Resp: 18  Temp: 97.8 F (36.6 C)  SpO2: 100%   KPS: 80. General: Alert, cooperative, pleasant, in no acute distress Head: Normal EENT: No conjunctival injection or scleral icterus. Oral mucosa moist Lungs: Resp effort normal Cardiac: Regular rate and rhythm Abdomen: Soft, non-distended abdomen Skin: No rashes cyanosis or petechiae. Extremities: No clubbing or edema  Neurologic Exam: Mental Status: Awake, alert, attentive to examiner. Oriented to self and environment. Language is fluent with intact comprehension.  Cranial Nerves: Visual acuity is grossly normal. Visual fields are full. Extra-ocular movements intact. No ptosis. Face is symmetric, tongue midline. Motor: 4+/5 left arm and leg with decreased bulk compared to right.  Moderate thenar atrophy on left. Reflexes are symmetric, no pathologic reflexes present. Intact finger to nose bilaterally Sensory: Intact to light touch and temperature Gait: Independent, hemiparetic   Labs: I have reviewed the data as listed    Component Value Date/Time   NA 138 01/17/2017 1139   K 3.9 01/17/2017 1139   CL 105 01/17/2017 1139   CO2 27 01/17/2017 1139   GLUCOSE 159 (H) 01/17/2017 1139   BUN 12 01/17/2017 1139   BUN 9.5 04/24/2015 0937   CREATININE 1.26 (H) 01/17/2017 1139   CREATININE 1.0 04/24/2015 0937   CALCIUM 9.3 01/17/2017 1139   PROT 7.6 01/17/2017 1139   ALBUMIN 3.3 (L) 01/17/2017 1139   AST 40 01/17/2017 1139   ALT 27 01/17/2017 1139   ALKPHOS 83 01/17/2017 1139   BILITOT 0.3 01/17/2017 1139   GFRNONAA  45 (L) 01/17/2017 1139    GFRAA 52 (L) 01/17/2017 1139   Lab Results  Component Value Date   WBC 6.8 01/17/2017   NEUTROABS 4.4 01/17/2017   HGB 12.3 01/17/2017   HCT 38.8 01/17/2017   MCV 88.2 01/17/2017   PLT 314 01/17/2017     Assessment/Plan 1. Meningioma ([place])  2. Chronic tension-type headache, intractable  Ms. Mickel has chronic daily headache which is likely secondary to long term medication overuse (ibuprofen).  We counseled her on recommended use of analgesia and its relationship with chronic pain.    We recommended continued de-escalation of analgesia.  Also recommended she take Elavil 50mg  HS, educated her on the purpose of this medication, which is headache prevention/prophylaxis.  We again discussed how brief courses of steroids could be used to help in the weaning process, but she deferred interest in steroids at this time.  She does have sleep apnea which is mild and not requiring nocturnal pressure support.  We appreciate the opportunity to participate in the care of Christine Harvey.   She should return in 4 months for headache evaluation.  Her meningioma should be re-imaged in 8 months.   All questions were answered. The patient knows to call the clinic with any problems, questions or concerns. No barriers to learning were detected.  The total time spent in the encounter was 25 minutes and more than 50% was on counseling and review of test results   Christine Sellers, MD Medical Director of Neuro-Oncology [facility] at [place] 07/20/18 12:48 PM

## 2018-07-20 NOTE — Telephone Encounter (Signed)
Printed avs and calender of upcoming appointment. Per 8/1 los 

## 2018-07-21 ENCOUNTER — Ambulatory Visit: Payer: Medicaid Other | Admitting: Internal Medicine

## 2018-07-24 ENCOUNTER — Telehealth: Payer: Self-pay | Admitting: *Deleted

## 2018-07-24 NOTE — Telephone Encounter (Signed)
Faxed demographics to [place] to [phone] Attn: Butch Penny

## 2018-07-31 ENCOUNTER — Other Ambulatory Visit: Payer: Self-pay

## 2018-07-31 ENCOUNTER — Telehealth: Payer: Self-pay

## 2018-07-31 DIAGNOSIS — D329 Benign neoplasm of meninges, unspecified: Secondary | ICD-10-CM

## 2018-07-31 NOTE — Telephone Encounter (Signed)
Received call from Olivia Lopez de Gutierrez with [facility] outpatient stating that she received the referral for PT/OT services dated 6/27 and will need a new order. She stated that the patient did not receive any PT/OT services from the 6/27 referral as there were difficulties with obtaining the referral since they do not have Epic access. Ordered an updated referral for outpatient PT/OT and faxed to Butch Penny at [phone]. Contact is [phone] option 1.

## 2018-08-03 NOTE — Telephone Encounter (Signed)
Error

## 2018-11-17 ENCOUNTER — Inpatient Hospital Stay: Payer: Medicaid Other | Attending: Internal Medicine | Admitting: Internal Medicine

## 2020-10-30 ENCOUNTER — Ambulatory Visit: Payer: Medicare HMO

## 2020-10-30 ENCOUNTER — Encounter: Payer: Self-pay | Admitting: Orthopaedic Surgery

## 2020-10-30 ENCOUNTER — Ambulatory Visit (INDEPENDENT_AMBULATORY_CARE_PROVIDER_SITE_OTHER): Payer: Medicare HMO | Admitting: Orthopaedic Surgery

## 2020-10-30 ENCOUNTER — Other Ambulatory Visit: Payer: Self-pay

## 2020-10-30 VITALS — BP 197/111 | HR 66 | Ht 65.0 in | Wt 186.0 lb

## 2020-10-30 DIAGNOSIS — G8929 Other chronic pain: Secondary | ICD-10-CM

## 2020-10-30 DIAGNOSIS — M25561 Pain in right knee: Secondary | ICD-10-CM | POA: Diagnosis not present

## 2020-10-30 MED ORDER — HYDROCODONE-ACETAMINOPHEN 5-325 MG PO TABS: ORAL_TABLET | ORAL | 0 refills | Status: DC

## 2020-10-30 NOTE — Progress Notes (Signed)
Subjective:    Patient ID: Christine Harvey, female    DOB: 17-May-1956, 64 y.o.   MRN: [id number]  HPI She has long history of knee pain.  I saw her last in early 2018 for this.  She has now developed more pain, crepitus and giving way.  She has swelling sometimes. She has no new trauma. She has been seen at [facility] and I have reviewed the notes. She is not improving.  She is a diabetic and is on insulin.  She has no redness, no numbness.  Advil, ice and heat have not helped.   Review of Systems  Constitutional: Positive for activity change.  Musculoskeletal: Positive for arthralgias, gait problem and joint swelling.  All other systems reviewed and are negative.  For Review of Systems, all other systems reviewed and are negative.  The following is a summary of the past history medically, past history surgically, known current medicines, social history and family history.  This information is gathered electronically by the computer from prior information and documentation.  I review this each visit and have found including this information at this point in the chart is beneficial and informative.   Past Medical History:  Diagnosis Date  . Brain tumor (benign) ([place]) 7 years   Meningioma  along the Clivis - 2.5 x 1.2 x 1.9 cm .  some iInvolvement long the Clival bone and Effect upon the Pons - s/p sterotactic radiosurgery in 2014.  Marland Kitchen CAD (coronary artery disease)    80-99% first diagonal (unable to be intervened upon), 60% circumflex, 75% RCA August 2017  . Depression   . Essential hypertension   . Frequent headaches   . Hyperlipidemia   . Morbid obesity ([place])   . Osteoarthritis   . Photophobia    With Headaches  . S/P radiation therapy 05/02/13   25 Gy at 5 Gy per fraction, last treatment on 04/30/13  . ST elevation myocardial infarction (STEMI) of inferolateral wall [facility])    August 2017  . Type 2 diabetes mellitus ([place])     Past Surgical History:  Procedure  Laterality Date  . CARDIAC CATHETERIZATION N/A 07/31/2016   Procedure: Left Heart Cath and Coronary Angiography;  Surgeon: Lorretta Harp, MD;  Location: [facility];  Service: Cardiovascular;  Laterality: N/A;  . TONSILLECTOMY     As a child    Current Outpatient Medications on File Prior to Visit  Medication Sig Dispense Refill  . ACCU-CHEK AVIVA PLUS test strip USE TO test blood glucose FOUR TIMES DAILY 150 each 0  . aspirin EC 81 MG EC tablet Take 1 tablet (81 mg total) by mouth daily.    Marland Kitchen atorvastatin (LIPITOR) 80 MG tablet Take 1 tablet (80 mg total) by mouth daily at 6 PM. 30 tablet 12  . Blood Glucose Monitoring Suppl (ACCU-CHEK AVIVA) device Use as instructed 1 each 0  . carvedilol (COREG) 25 MG tablet TAKE 1 TABLET BY MOUTH TWICE DAILY WITH A MEAL.  12  . clopidogrel (PLAVIX) 75 MG tablet Take 1 tablet (75 mg total) by mouth daily with breakfast. 30 tablet 12  . glucose blood (ACCU-CHEK AVIVA) test strip Test glucose 4 times a day E11.65 150 each 3  . glucose blood (ACCU-CHEK AVIVA) test strip Use to test blood glucose 4 times a day. 150 each 3  . insulin aspart (NOVOLOG FLEXPEN) 100 UNIT/ML FlexPen Inject 8-16 Units into the skin 3 (three) times daily with meals. 15 mL 2  .  Insulin Glargine (LANTUS SOLOSTAR) 100 UNIT/ML Solostar Pen Inject 50 Units into the skin at bedtime.    . insulin lispro (HUMALOG KWIKPEN) 100 UNIT/ML KiwkPen Inject 0.08-0.14 mLs (8-14 Units total) into the skin 3 (three) times daily. 5 pen 2  . INVOKANA 100 MG TABS tablet TAKE 1 TABLET BY MOUTH DAILY BEFORE BREAKFAST. 30 tablet 2  . Lancet Devices (ACCU-CHEK SOFTCLIX) lancets Use as instructed 1 each 0  . Lancets MISC Test 4 x daily. E11.65. Accu-Chek Aviva 200 each 5  . losartan (COZAAR) 100 MG tablet Take 1 tablet (100 mg total) by mouth daily. 30 tablet 12  . metFORMIN (GLUCOPHAGE) 500 MG tablet TAKE 1 TABLET BY MOUTH TWICE DAILY WITH A MEAL 60 tablet 2  . nitroGLYCERIN (NITROSTAT) 0.4 MG SL  tablet Place 1 tablet (0.4 mg total) under the tongue every 5 (five) minutes x 3 doses as needed for chest pain. 25 tablet 2  . isosorbide mononitrate (IMDUR) 30 MG 24 hr tablet Take 1 tablet (30 mg total) by mouth daily. 90 tablet 3   No current facility-administered medications on file prior to visit.    Social History   Socioeconomic History  . Marital status: Single    Spouse name: Not on file  . Number of children: Not on file  . Years of education: Not on file  . Highest education level: Not on file  Occupational History  . Not on file  Tobacco Use  . Smoking status: Light Tobacco Smoker    Types: Cigarettes  . Smokeless tobacco: Never Used  . Tobacco comment: she smokes when she drinks once weekly  Vaping Use  . Vaping Use: Never used  Substance and Sexual Activity  . Alcohol use: Yes    Comment: 2010 -  Alcohol Rehabilitation/ Drinks Occassionally  . Drug use: No  . Sexual activity: Not Currently    Partners: Male  Other Topics Concern  . Not on file  Social History Narrative  . Not on file   Social Determinants of Health   Financial Resource Strain:   . Difficulty of Paying Living Expenses: Not on file  Food Insecurity:   . Worried About Charity fundraiser in the Last Year: Not on file  . Ran Out of Food in the Last Year: Not on file  Transportation Needs:   . Lack of Transportation (Medical): Not on file  . Lack of Transportation (Non-Medical): Not on file  Physical Activity:   . Days of Exercise per Week: Not on file  . Minutes of Exercise per Session: Not on file  Stress:   . Feeling of Stress : Not on file  Social Connections:   . Frequency of Communication with Friends and Family: Not on file  . Frequency of Social Gatherings with Friends and Family: Not on file  . Attends Religious Services: Not on file  . Active Member of Clubs or Organizations: Not on file  . Attends Archivist Meetings: Not on file  . Marital Status: Not on file    Intimate Partner Violence:   . Fear of Current or Ex-Partner: Not on file  . Emotionally Abused: Not on file  . Physically Abused: Not on file  . Sexually Abused: Not on file    Family History  Problem Relation Age of Onset  . Hypertension Mother        MI in her 53's to 5's.  . CAD Mother   . Hypertension Father   . Stroke Father   .  Cancer Sister     BP (!) 197/111   Pulse 66   Ht 5\' 5"  (1.651 m)   Wt 186 lb (84.4 kg)   BMI 30.95 kg/m   Body mass index is 30.95 kg/m.     Objective:   Physical Exam Vitals and nursing note reviewed. Exam conducted with a chaperone present.  Constitutional:      Appearance: She is well-developed.  HENT:     Head: Normocephalic and atraumatic.  Eyes:     Conjunctiva/sclera: Conjunctivae normal.     Pupils: Pupils are equal, round, and reactive to light.  Cardiovascular:     Rate and Rhythm: Normal rate and regular rhythm.  Pulmonary:     Effort: Pulmonary effort is normal.  Abdominal:     Palpations: Abdomen is soft.  Musculoskeletal:     Cervical back: Normal range of motion and neck supple.       Legs:  Skin:    General: Skin is warm and dry.  Neurological:     Mental Status: She is alert and oriented to person, place, and time.     Cranial Nerves: No cranial nerve deficit.     Motor: No abnormal muscle tone.     Coordination: Coordination normal.     Deep Tendon Reflexes: Reflexes are normal and symmetric. Reflexes normal.  Psychiatric:        Behavior: Behavior normal.        Thought Content: Thought content normal.        Judgment: Judgment normal.    X-rays were done of the right knee, reported separately.       Assessment & Plan:   Encounter Diagnosis  Name Primary?  . Chronic pain of right knee Yes   I am concerned about medial meniscus tear.  I will get MRI.  I have reviewed the [facility] web site prior to prescribing narcotic medicine for this  patient.  She declines injection. Return in one month.  Call if any problem.  Precautions discussed.   Electronically Signed Sanjuana Kava, MD 11/11/20219:59 AM

## 2020-11-17 ENCOUNTER — Other Ambulatory Visit: Payer: Self-pay

## 2020-11-17 ENCOUNTER — Ambulatory Visit (HOSPITAL_COMMUNITY)
Admission: RE | Admit: 2020-11-17 | Discharge: 2020-11-17 | Disposition: A | Discharge status: Home / Self Care | Payer: Medicare HMO | Source: Ambulatory Visit | Attending: Orthopaedic Surgery | Admitting: Orthopaedic Surgery

## 2020-11-17 DIAGNOSIS — M25561 Pain in right knee: Secondary | ICD-10-CM | POA: Insufficient documentation

## 2020-11-17 DIAGNOSIS — G8929 Other chronic pain: Secondary | ICD-10-CM | POA: Insufficient documentation

## 2020-11-20 ENCOUNTER — Encounter: Payer: Self-pay | Admitting: Orthopaedic Surgery

## 2020-11-20 ENCOUNTER — Ambulatory Visit (INDEPENDENT_AMBULATORY_CARE_PROVIDER_SITE_OTHER): Payer: Medicare HMO | Admitting: Orthopaedic Surgery

## 2020-11-20 ENCOUNTER — Other Ambulatory Visit: Payer: Self-pay

## 2020-11-20 VITALS — Ht 65.0 in | Wt 190.0 lb

## 2020-11-20 DIAGNOSIS — G8929 Other chronic pain: Secondary | ICD-10-CM

## 2020-11-20 DIAGNOSIS — M25561 Pain in right knee: Secondary | ICD-10-CM | POA: Diagnosis not present

## 2020-11-20 MED ORDER — HYDROCODONE-ACETAMINOPHEN 5-325 MG PO TABS: ORAL_TABLET | ORAL | 0 refills | Status: AC

## 2020-11-20 NOTE — Progress Notes (Signed)
[name], female DOB:07-Nov-1956, 64 y.o. TKW:[id number]  Chief Complaint  Patient presents with  . Knee Pain    Rt knee    HPI  Christine Harvey is a 64 y.o. female who has right knee pain.  She had MRI which showed: IMPRESSION: Negative for meniscal or ligament tear.  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  Very mild appearing patellofemoral and medial compartment degenerative disease.  I have independently reviewed the MRI.     I have explained the findings to her.  She is to continue her medicine and exercises.   Body mass index is 31.62 kg/m.  ROS  Review of Systems  Constitutional: Positive for activity change.  Musculoskeletal: Positive for arthralgias, gait problem and joint swelling.  All other systems reviewed and are negative.   All other systems reviewed and are negative.  The following is a summary of the past history medically, past history surgically, known current medicines, social history and family history.  This information is gathered electronically by the computer from prior information and documentation.  I review this each visit and have found including this information at this point in the chart is beneficial and informative.    Past Medical History:  Diagnosis Date  . 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  some iInvolvement long the Clival bone and Effect upon the Pons - s/p sterotactic radiosurgery in 2014.  Marland Kitchen CAD (coronary artery disease)    80-99% first diagonal (unable to be intervened upon), 60% circumflex, 75% RCA August 2017  . Depression   . Essential hypertension   . Frequent headaches   . Hyperlipidemia   . Morbid obesity ([place])   . Osteoarthritis   . Photophobia    With Headaches  . S/P radiation therapy 05/02/13   25 Gy at 5 Gy per fraction, last treatment  on 04/30/13  . ST elevation myocardial infarction (STEMI) of inferolateral wall [facility])    August 2017  . Type 2 diabetes mellitus ([place])     Past Surgical History:  Procedure Laterality Date  . CARDIAC CATHETERIZATION N/A 07/31/2016   Procedure: Left Heart Cath and Coronary Angiography;  Surgeon: Lorretta Harp, MD;  Location: [facility];  Service: Cardiovascular;  Laterality: N/A;  . TONSILLECTOMY     As a child    Family History  Problem Relation Age of Onset  . Hypertension Mother        MI in her 41's to 67's.  . CAD Mother   . Hypertension Father   . Stroke Father   . Cancer Sister     Social History Social History   Tobacco Use  . Smoking status: Light Tobacco Smoker    Types: Cigarettes  . Smokeless tobacco: Never Used  . Tobacco comment: she smokes when she drinks once weekly  Vaping Use  . Vaping Use: Never used  Substance Use Topics  . Alcohol use: Yes    Comment: 2010 -  Alcohol Rehabilitation/ Drinks Occassionally  . Drug use: No    Allergies  Allergen Reactions  . Sulfa Antibiotics Other (See Comments)    Passes out    Current Outpatient Medications  Medication Sig Dispense Refill  . ACCU-CHEK AVIVA PLUS test strip USE TO test blood glucose FOUR TIMES DAILY 150 each 0  . aspirin EC 81  MG EC tablet Take 1 tablet (81 mg total) by mouth daily.    Marland Kitchen atorvastatin (LIPITOR) 80 MG tablet Take 1 tablet (80 mg total) by mouth daily at 6 PM. 30 tablet 12  . Blood Glucose Monitoring Suppl (ACCU-CHEK AVIVA) device Use as instructed 1 each 0  . carvedilol (COREG) 25 MG tablet TAKE 1 TABLET BY MOUTH TWICE DAILY WITH A MEAL.  12  . clopidogrel (PLAVIX) 75 MG tablet Take 1 tablet (75 mg total) by mouth daily with breakfast. 30 tablet 12  . glucose blood (ACCU-CHEK AVIVA) test strip Test glucose 4 times a day E11.65 150 each 3  . glucose blood (ACCU-CHEK AVIVA) test strip Use to test blood glucose 4 times a day. 150 each 3  . HYDROcodone-acetaminophen  (NORCO/VICODIN) 5-325 MG tablet One tablet every six hours for pain.  Limit 7 days. 28 tablet 0  . insulin aspart (NOVOLOG FLEXPEN) 100 UNIT/ML FlexPen Inject 8-16 Units into the skin 3 (three) times daily with meals. 15 mL 2  . Insulin Glargine (LANTUS SOLOSTAR) 100 UNIT/ML Solostar Pen Inject 50 Units into the skin at bedtime.    . insulin lispro (HUMALOG KWIKPEN) 100 UNIT/ML KiwkPen Inject 0.08-0.14 mLs (8-14 Units total) into the skin 3 (three) times daily. 5 pen 2  . INVOKANA 100 MG TABS tablet TAKE 1 TABLET BY MOUTH DAILY BEFORE BREAKFAST. 30 tablet 2  . isosorbide mononitrate (IMDUR) 30 MG 24 hr tablet Take 1 tablet (30 mg total) by mouth daily. 90 tablet 3  . Lancet Devices (ACCU-CHEK SOFTCLIX) lancets Use as instructed 1 each 0  . Lancets MISC Test 4 x daily. E11.65. Accu-Chek Aviva 200 each 5  . losartan (COZAAR) 100 MG tablet Take 1 tablet (100 mg total) by mouth daily. 30 tablet 12  . metFORMIN (GLUCOPHAGE) 500 MG tablet TAKE 1 TABLET BY MOUTH TWICE DAILY WITH A MEAL 60 tablet 2  . nitroGLYCERIN (NITROSTAT) 0.4 MG SL tablet Place 1 tablet (0.4 mg total) under the tongue every 5 (five) minutes x 3 doses as needed for chest pain. 25 tablet 2   No current facility-administered medications for this visit.     Physical Exam  Height 5\' 5"  (1.651 m), weight 190 lb (86.2 kg).  Constitutional: overall normal hygiene, normal nutrition, well developed, normal grooming, normal body habitus. Assistive device:none  Musculoskeletal: gait and station Limp right, muscle tone and strength are normal, no tremors or atrophy is present.  .  Neurological: coordination overall normal.  Deep tendon reflex/nerve stretch intact.  Sensation normal.  Cranial nerves II-XII intact.   Skin:   Normal overall no scars, lesions, ulcers or rashes. No psoriasis.  Psychiatric: Alert and oriented x 3.  Recent memory intact, remote memory unclear.  Normal mood and affect. Well groomed.  Good eye  contact.  Cardiovascular: overall no swelling, no varicosities, no edema bilaterally, normal temperatures of the legs and arms, no clubbing, cyanosis and good capillary refill.  Lymphatic: palpation is normal.  Right knee is tender, effusion slight, crepitus, ROM 0 to 110, stable, limp right.  All other systems reviewed and are negative   The patient has been educated about the nature of the problem(s) and counseled on treatment options.  The patient appeared to understand what I have discussed and is in agreement with it.  Encounter Diagnosis  Name Primary?  . Chronic pain of right knee Yes    PLAN Call if any problems.  Precautions discussed.  Continue current medications.   Return to  clinic 6 weeks   I have reviewed the [place] web site prior to prescribing narcotic medicine for this patient.   Electronically Signed Sanjuana Kava, MD 12/2/20211:57 PM

## 2020-12-20 DEATH — deceased

## 2021-01-01 ENCOUNTER — Ambulatory Visit: Payer: Medicare HMO | Admitting: Orthopaedic Surgery

## 2021-01-01 ENCOUNTER — Encounter: Payer: Self-pay | Admitting: Orthopaedic Surgery

## 2021-02-13 IMAGING — MR MR KNEE*R* W/O CM
7 series · 40 of 40 positions shown · non-contrast
Comparison: Plain films right knee 10/30/2020.

CLINICAL DATA: Right knee pain for 3 years.  No known injury.

EXAM:
MRI OF THE RIGHT KNEE WITHOUT CONTRAST
TECHNIQUE: Multiplanar, multisequence MR imaging of the knee was performed. No
intravenous contrast was administered.

[Series 8: T2 fat-sat · axial · right · 4.0mm · 0.47mm/px · z∈[-34,+90]mm · 5 of 26 slices shown (1 of 3)]
[im 1/26]
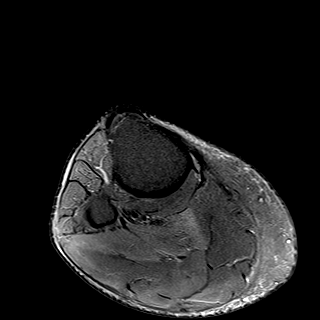
[im 7/26]
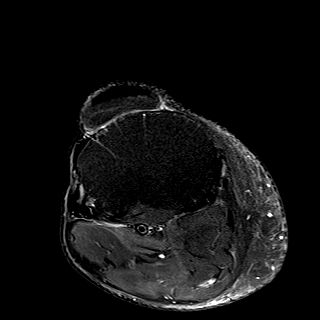
[im 13/26]
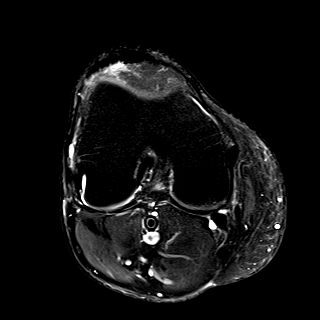
[im 19/26]
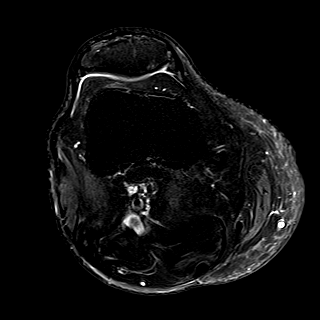
[im 26/26]
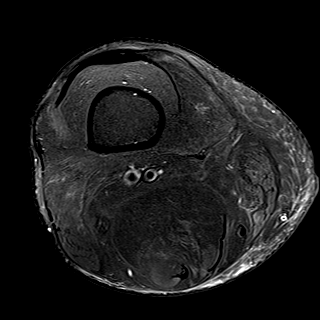

[Series 9: T1 · coronal · right · 4.0mm · 0.59mm/px · 5 of 24 slices shown]
[im 1/24]
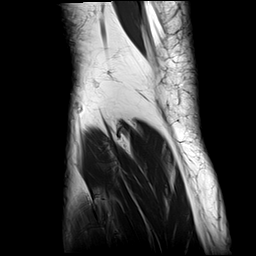
[im 6/24]
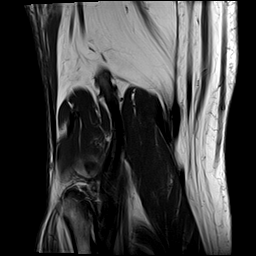
[im 12/24]
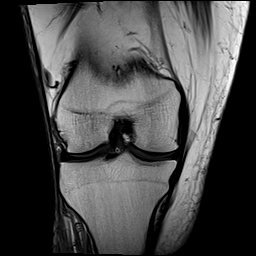
[im 18/24]
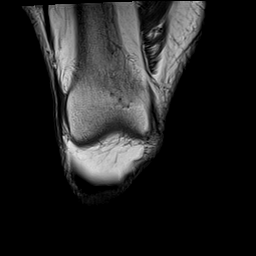
[im 24/24]
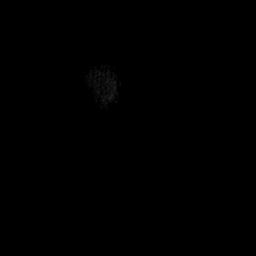

[Series 10: T2 fat-sat · coronal · right · 4.0mm · 0.59mm/px · 7 of 28 slices shown (2 of 3)]
[im 1/28]
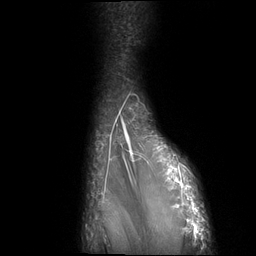
[im 5/28]
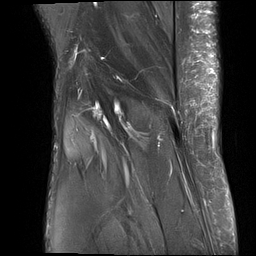
[im 10/28]
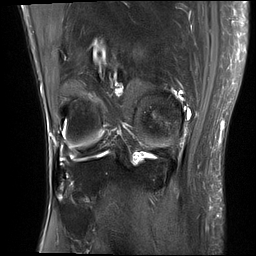
[im 14/28]
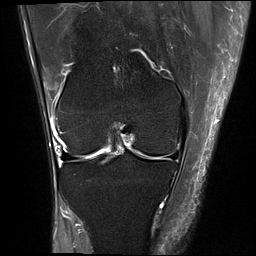
[im 19/28]
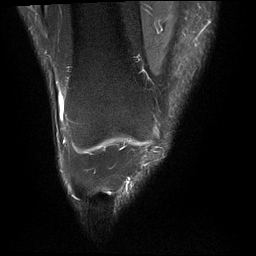
[im 23/28]
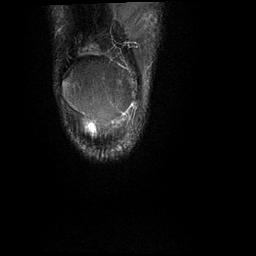
[im 28/28]
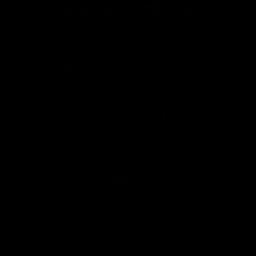

[Series 11: PD fat-sat · coronal · right · 4.0mm · 0.59mm/px · 7 of 28 slices shown (1 of 2)]
[im 1/28]
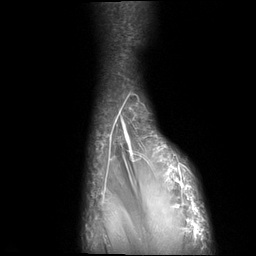
[im 5/28]
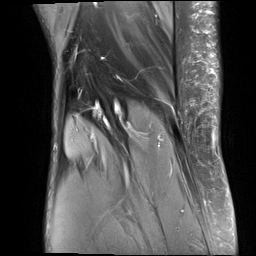
[im 10/28]
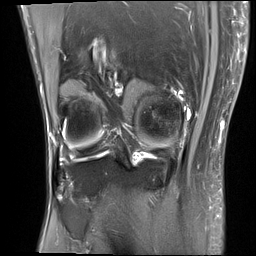
[im 14/28]
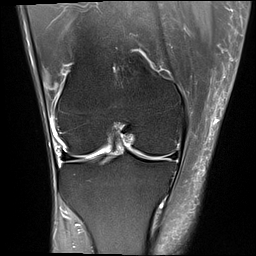
[im 19/28]
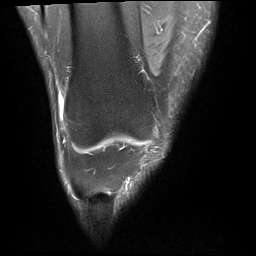
[im 23/28]
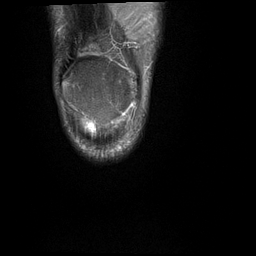
[im 28/28]
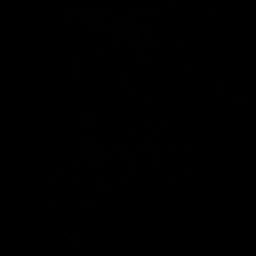

[Series 12: PD fat-sat · sagittal · right · 3.0mm · 0.59mm/px · 7 of 28 slices shown (2 of 2)]
[im 1/28]
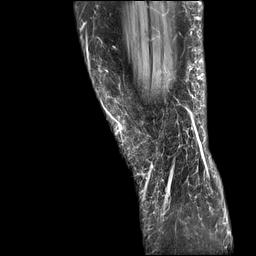
[im 5/28]
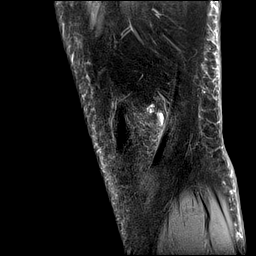
[im 10/28]
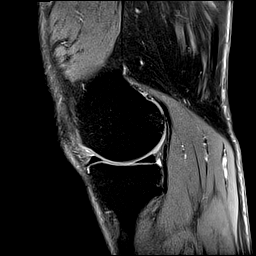
[im 14/28]
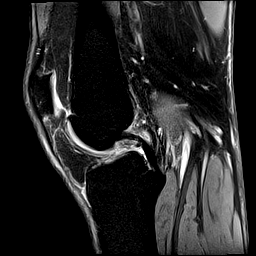
[im 19/28]
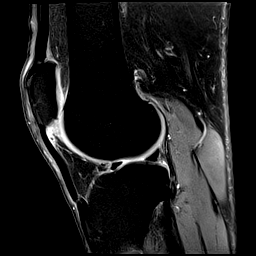
[im 23/28]
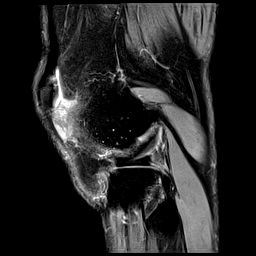
[im 28/28]
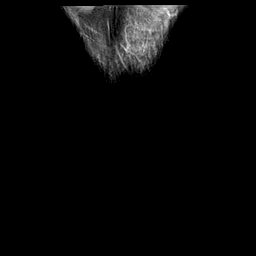

[Series 13: T2 fat-sat · sagittal · right · 3.0mm · 0.59mm/px · 7 of 28 slices shown (3 of 3)]
[im 1/28]
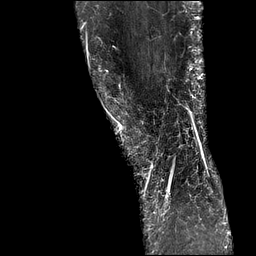
[im 5/28]
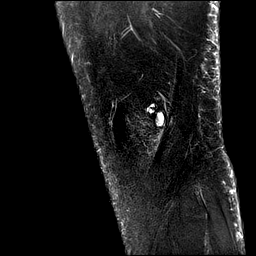
[im 10/28]
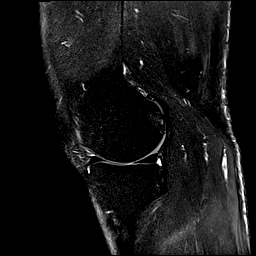
[im 14/28]
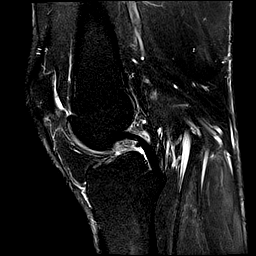
[im 19/28]
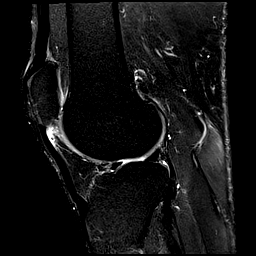
[im 23/28]
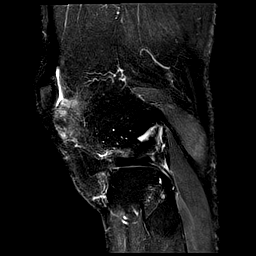
[im 28/28]
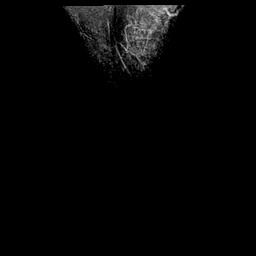

[Series 14: PD · coronal · right · 2.0mm · 0.47mm/px · 2 of 10 slices shown]
[im 1/10]
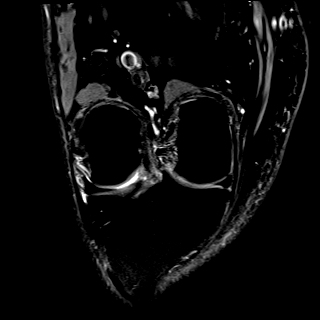
[im 10/10]
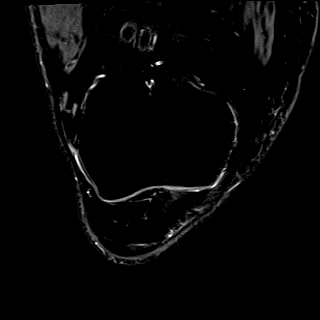

[40 of 40 positions shown; findings below may reference images not displayed]

FINDINGS: MENISCI

Medial meniscus:  Intact.

Lateral meniscus:  Intact.

LIGAMENTS

Cruciates:  Intact.

Collaterals:  Intact.

CARTILAGE

Patellofemoral: Minimally degenerated along the medial facet in the
superior pole.

Medial: Minimally degenerated along the posterior aspect of the
medial femoral condyle.

Lateral:  Preserved.

Joint: There is a intense edema in Hoffa's fat off the inferior pole
of the lateral patellar facet.

Popliteal Fossa:  Very small Baker's cyst.

Extensor Mechanism:  Intact.

Bones: A few tiny subchondral cysts are seen in the posterior aspect
of the medial femoral condyle and superior pole of the medial
patella. No fracture, stress change or worrisome lesion.

Other: None.
IMPRESSION: Negative for meniscal or ligament tear.

Edema in Hoffa's fat off the inferior pole of the lateral patella
compatible with patellar tendon-lateral femoral condyle friction
syndrome.

Very mild appearing patellofemoral and medial compartment
degenerative disease.
